# Patient Record
Sex: Male | Born: 1960 | Race: Black or African American | Hispanic: No | Marital: Single | Smoking: Former smoker
Health system: Southern US, Community
[De-identification: ages and names within clinical notes are randomized; demographics above are authoritative.]

## PROBLEM LIST (undated history)

## (undated) ENCOUNTER — Emergency Department (HOSPITAL_COMMUNITY): Admission: EM | Payer: Self-pay | Source: Home / Self Care

## (undated) DIAGNOSIS — D649 Anemia, unspecified: Secondary | ICD-10-CM

## (undated) DIAGNOSIS — R451 Restlessness and agitation: Secondary | ICD-10-CM

## (undated) HISTORY — DX: Anemia, unspecified: D64.9

## (undated) HISTORY — DX: Restlessness and agitation: R45.1

---

## 2000-07-02 ENCOUNTER — Emergency Department (HOSPITAL_COMMUNITY): Admission: EM | Admit: 2000-07-02 | Discharge: 2000-07-02 | Payer: Self-pay | Admitting: Emergency Medicine

## 2003-06-18 ENCOUNTER — Emergency Department (HOSPITAL_COMMUNITY): Admission: EM | Admit: 2003-06-18 | Discharge: 2003-06-18 | Payer: Self-pay | Admitting: Emergency Medicine

## 2003-06-18 ENCOUNTER — Encounter: Payer: Self-pay | Admitting: Emergency Medicine

## 2003-12-07 ENCOUNTER — Emergency Department (HOSPITAL_COMMUNITY): Admission: EM | Admit: 2003-12-07 | Discharge: 2003-12-07 | Payer: Self-pay | Admitting: Emergency Medicine

## 2004-02-11 ENCOUNTER — Emergency Department (HOSPITAL_COMMUNITY): Admission: EM | Admit: 2004-02-11 | Discharge: 2004-02-11 | Payer: Self-pay | Admitting: Emergency Medicine

## 2005-03-20 ENCOUNTER — Emergency Department (HOSPITAL_COMMUNITY): Admission: EM | Admit: 2005-03-20 | Discharge: 2005-03-20 | Payer: Self-pay | Admitting: Emergency Medicine

## 2006-03-08 ENCOUNTER — Emergency Department (HOSPITAL_COMMUNITY): Admission: EM | Admit: 2006-03-08 | Discharge: 2006-03-08 | Payer: Self-pay | Admitting: Emergency Medicine

## 2006-06-22 ENCOUNTER — Emergency Department (HOSPITAL_COMMUNITY): Admission: EM | Admit: 2006-06-22 | Discharge: 2006-06-22 | Payer: Self-pay | Admitting: Emergency Medicine

## 2006-06-28 ENCOUNTER — Emergency Department (HOSPITAL_COMMUNITY): Admission: EM | Admit: 2006-06-28 | Discharge: 2006-06-28 | Payer: Self-pay | Admitting: Emergency Medicine

## 2006-10-13 ENCOUNTER — Emergency Department (HOSPITAL_COMMUNITY): Admission: EM | Admit: 2006-10-13 | Discharge: 2006-10-13 | Payer: Self-pay | Admitting: Emergency Medicine

## 2008-02-14 ENCOUNTER — Emergency Department: Payer: Self-pay | Admitting: Emergency Medicine

## 2008-10-25 ENCOUNTER — Emergency Department (HOSPITAL_COMMUNITY): Admission: EM | Admit: 2008-10-25 | Discharge: 2008-10-25 | Payer: Self-pay | Admitting: Emergency Medicine

## 2011-01-16 LAB — URINALYSIS, ROUTINE W REFLEX MICROSCOPIC
Hgb urine dipstick: NEGATIVE
Nitrite: NEGATIVE
Urobilinogen, UA: 0.2 mg/dL (ref 0.0–1.0)

## 2011-01-16 LAB — GC/CHLAMYDIA PROBE AMP, GENITAL: Chlamydia, DNA Probe: NEGATIVE

## 2011-01-16 LAB — URINE MICROSCOPIC-ADD ON

## 2011-03-20 DIAGNOSIS — K4090 Unilateral inguinal hernia, without obstruction or gangrene, not specified as recurrent: Secondary | ICD-10-CM | POA: Insufficient documentation

## 2011-04-02 ENCOUNTER — Emergency Department (HOSPITAL_COMMUNITY): Payer: PRIVATE HEALTH INSURANCE

## 2011-04-02 ENCOUNTER — Inpatient Hospital Stay (HOSPITAL_COMMUNITY)
Admission: EM | Admit: 2011-04-02 | Discharge: 2011-04-04 | DRG: 208 | Disposition: A | Discharge status: Home / Self Care | Payer: PRIVATE HEALTH INSURANCE | Attending: Internal Medicine | Admitting: Internal Medicine

## 2011-04-02 DIAGNOSIS — D649 Anemia, unspecified: Secondary | ICD-10-CM | POA: Diagnosis present

## 2011-04-02 DIAGNOSIS — IMO0002 Reserved for concepts with insufficient information to code with codable children: Secondary | ICD-10-CM | POA: Diagnosis present

## 2011-04-02 DIAGNOSIS — J45902 Unspecified asthma with status asthmaticus: Secondary | ICD-10-CM | POA: Diagnosis present

## 2011-04-02 DIAGNOSIS — J9600 Acute respiratory failure, unspecified whether with hypoxia or hypercapnia: Principal | ICD-10-CM | POA: Diagnosis present

## 2011-04-02 LAB — CBC
MCHC: 33.9 g/dL (ref 30.0–36.0)
Platelets: 336 10*3/uL (ref 150–400)
RBC: 6.02 MIL/uL — ABNORMAL HIGH (ref 4.22–5.81)

## 2011-04-02 LAB — CK TOTAL AND CKMB (NOT AT ARMC)
CK, MB: 4 ng/mL (ref 0.3–4.0)
Relative Index: 1 (ref 0.0–2.5)
Total CK: 385 U/L — ABNORMAL HIGH (ref 7–232)

## 2011-04-02 LAB — DIFFERENTIAL
Basophils Absolute: 0.1 10*3/uL (ref 0.0–0.1)
Eosinophils Relative: 7 % — ABNORMAL HIGH (ref 0–5)
Lymphs Abs: 3.2 10*3/uL (ref 0.7–4.0)
Neutro Abs: 6.4 10*3/uL (ref 1.7–7.7)
Neutrophils Relative %: 56 % (ref 43–77)

## 2011-04-02 LAB — POCT I-STAT, CHEM 8
Calcium, Ion: 1.18 mmol/L (ref 1.12–1.32)
Creatinine, Ser: 1.2 mg/dL (ref 0.50–1.35)
Hemoglobin: 16.3 g/dL (ref 13.0–17.0)
Potassium: 4.3 mEq/L (ref 3.5–5.1)
TCO2: 27 mmol/L (ref 0–100)

## 2011-04-02 LAB — POCT I-STAT 3, ART BLOOD GAS (G3+)
TCO2: 27 mmol/L (ref 0–100)
pCO2 arterial: 52.6 mmHg — ABNORMAL HIGH (ref 35.0–45.0)
pH, Arterial: 7.232 — ABNORMAL LOW (ref 7.350–7.450)
pO2, Arterial: 89 mmHg (ref 80.0–100.0)
pO2, Arterial: 72 mmHg — ABNORMAL LOW (ref 80.0–100.0)

## 2011-04-02 LAB — URINE MICROSCOPIC-ADD ON

## 2011-04-02 LAB — URINALYSIS, ROUTINE W REFLEX MICROSCOPIC
Bilirubin Urine: NEGATIVE
Specific Gravity, Urine: 1.011 (ref 1.005–1.030)

## 2011-04-02 LAB — GLUCOSE, CAPILLARY: Glucose-Capillary: 150 mg/dL — ABNORMAL HIGH (ref 70–99)

## 2011-04-03 ENCOUNTER — Inpatient Hospital Stay (HOSPITAL_COMMUNITY): Payer: PRIVATE HEALTH INSURANCE

## 2011-04-03 DIAGNOSIS — J45902 Unspecified asthma with status asthmaticus: Secondary | ICD-10-CM

## 2011-04-03 DIAGNOSIS — J9600 Acute respiratory failure, unspecified whether with hypoxia or hypercapnia: Secondary | ICD-10-CM

## 2011-04-03 LAB — CBC
HCT: 33.3 % — ABNORMAL LOW (ref 39.0–52.0)
Hemoglobin: 11.2 g/dL — ABNORMAL LOW (ref 13.0–17.0)
MCH: 22.4 pg — ABNORMAL LOW (ref 26.0–34.0)
MCV: 66.5 fL — ABNORMAL LOW (ref 78.0–100.0)
Platelets: 302 10*3/uL (ref 150–400)
RBC: 5.01 MIL/uL (ref 4.22–5.81)

## 2011-04-03 LAB — BASIC METABOLIC PANEL
BUN: 10 mg/dL (ref 6–23)
Chloride: 104 mEq/L (ref 96–112)
Creatinine, Ser: 0.99 mg/dL (ref 0.50–1.35)
GFR calc Af Amer: >60 mL/min (ref >60)
GFR calc non Af Amer: >60 mL/min (ref >60)

## 2011-04-03 LAB — RAPID URINE DRUG SCREEN, HOSP PERFORMED: Amphetamines: NOT DETECTED

## 2011-04-04 ENCOUNTER — Inpatient Hospital Stay (HOSPITAL_COMMUNITY): Payer: PRIVATE HEALTH INSURANCE

## 2011-04-04 LAB — CBC
MCV: 65.9 fL — ABNORMAL LOW (ref 78.0–100.0)
Platelets: 298 10*3/uL (ref 150–400)
RBC: 4.99 MIL/uL (ref 4.22–5.81)
WBC: 12.7 10*3/uL — ABNORMAL HIGH (ref 4.0–10.5)

## 2011-04-04 LAB — BASIC METABOLIC PANEL
CO2: 27 mEq/L (ref 19–32)
Chloride: 106 mEq/L (ref 96–112)
Creatinine, Ser: 0.9 mg/dL (ref 0.50–1.35)
Potassium: 3.8 mEq/L (ref 3.5–5.1)

## 2011-04-05 NOTE — H&P (Signed)
  NAMEZAYVEON, RASCHKE NO.:  [id number]  MEDICAL RECORD NO.:  [id number]  LOCATION:  2103                         FACILITY:  [facility]  PHYSICIAN:  Charlcie Cradle. Delford Field, MD, FCCPDATE OF BIRTH:  03-22-1961  DATE OF ADMISSION:  04/02/2011 DATE OF DISCHARGE:                             HISTORY & PHYSICAL   CHIEF COMPLAINT:  Respiratory failure.  HISTORY OF PRESENT ILLNESS:  A 50 year old white male who has had increased shortness of breath for 1 week.  It progressed to the point where he came to the emergency room early morning hours April 02, 2011, in status asthmaticus was orally intubated.  Critical Care asked to admit. No prior history of other medical problems are noted.  PAST MEDICAL HISTORY:  Medical only asthma.  SURGICAL HISTORY:  None.  Family history, review of systems and social history noncontributory.  MEDICATIONS PRIOR TO ADMISSION:  Albuterol only.  ALLERGIES:  None.  PHYSICAL EXAMINATION:  VITAL SIGNS:  Pulse 107, blood pressure 96/68, sat 97%. GENERAL:  The patient is orally intubated, sedated on propofol drip. CHEST:  Distant breath sounds, prolonged inspiratory phase. CARDIAC:  A regular rate and rhythm without S3.  Normal S1, S2. ABDOMEN:  Soft, nontender. EXTREMITY:  No edema, clubbing or venous disease. SKIN:  Clear. NEUROLOGIC:  Intact.  LABORATORY DATA:  White count 11,000, hemoglobin 13.6, platelet count 336.  Urinalysis showed 3-6 white cells, many bacteria, trace leukocytes, troponin I is less than 0.3.  PT/PTT normal, pH 7.23, pCO2 61, pO2 89.  Sodium 140, potassium 4.3, chloride is 105, BUN 13, creatinine 1.2, CO2 27.  IMPRESSION:  Status asthmaticus with ongoing airway obstruction  RECOMMENDATIONS:  Admit to ICU bed, see for orders.  Give intensive neb treatments, give IV Levaquin for treatment of both urinary tract infection and asthmatic bronchitis, give IV Medrol.     Charlcie Cradle Delford Field, MD, [facility]     PEW/MEDQ  D:   04/02/2011  T:  04/02/2011  Job:  [id number]  Electronically Signed by Shan Levans MD FCCP on 04/05/2011 04:33:28 AM

## 2011-04-07 ENCOUNTER — Encounter: Payer: Self-pay | Admitting: Internal Medicine

## 2011-04-10 ENCOUNTER — Encounter: Payer: Self-pay | Admitting: Internal Medicine

## 2011-04-10 ENCOUNTER — Encounter: Payer: Self-pay | Admitting: *Deleted

## 2011-04-10 ENCOUNTER — Ambulatory Visit (INDEPENDENT_AMBULATORY_CARE_PROVIDER_SITE_OTHER): Payer: PRIVATE HEALTH INSURANCE | Admitting: Internal Medicine

## 2011-04-10 VITALS — BP 136/80 | HR 85 | Temp 97.8°F | Ht 72.0 in | Wt 172.8 lb

## 2011-04-10 DIAGNOSIS — R0609 Other forms of dyspnea: Secondary | ICD-10-CM

## 2011-04-10 DIAGNOSIS — J449 Chronic obstructive pulmonary disease, unspecified: Secondary | ICD-10-CM | POA: Insufficient documentation

## 2011-04-10 DIAGNOSIS — J45909 Unspecified asthma, uncomplicated: Secondary | ICD-10-CM

## 2011-04-10 DIAGNOSIS — R0600 Dyspnea, unspecified: Secondary | ICD-10-CM

## 2011-04-10 DIAGNOSIS — J441 Chronic obstructive pulmonary disease with (acute) exacerbation: Secondary | ICD-10-CM | POA: Diagnosis present

## 2011-04-10 MED ORDER — BUDESONIDE-FORMOTEROL FUMARATE 160-4.5 MCG/ACT IN AERO: INHALATION_SPRAY | RESPIRATORY_TRACT | Status: DC

## 2011-04-10 NOTE — Assessment & Plan Note (Addendum)
DDX of  difficult airways managment all start with A and  include Adherence, Ace Inhibitors, Acid Reflux, Active Sinus Disease, Alpha 1 Antitripsin deficiency, Anxiety masquerading as Airways dz,  ABPA,  allergy(esp in young), Aspiration (esp in elderly), Adverse effects of DPI,  Active smokers, plus two Bs  = Bronchiectasis and Beta blocker use..and one C= CHF   In this case Adherence is the biggest issue and starts with  inability to use HFA optimally  and also  understand that SABA treats the symptoms but doesn't get to the underlying problem (inflammation).  I used  the analogy of putting steroid cream on a rash to help explain the meaning of topical therapy and the need to get the drug to the target tissue.  The proper method of use, as well as anticipated side effects, of this metered-dose inhaler are discussed and demonstrated to the patient. improvedto 75% with coaching  Advair not adequate, still wheezing on prednisone so try change to symbicort 160 hfa  Needs f/u with PFT's before return to dusty environment,  Though strongly suspect the cigarettes were the bigger culprit and needs to maintain off at all costs

## 2011-04-10 NOTE — Progress Notes (Signed)
Subjective:     Patient ID: Colton Ford, male   DOB: 20-Feb-1961, 50 y.o.   MRN: [id number]  HPI  49 yobm quit smoking on 04/02/11 with severe resp distress >  > ICU on ventilator with dx asthma exac  04/10/2011 Initial pulmonary office eval usually followed by [name] with h/o childhood asthma outgrew it around the 3rd grade with one episode of ER trip 2008  And since then required intermittent albuterol sometimes not for several months at a time but since 03/20/2011 needed it daily.  Addmit 7/1-12/2010 for status asthmaticus requiring vent overnight and extubated and discharged on advair on 7/3.   Works at [employer] where dusty and hot but last worked there 6/29 and has not returned.  Still sob variably present sitting still and some worse walking across the parking lot. No purulent sputum.  Pt denies any significant sore throat, dysphagia, itching, sneezing,  nasal congestion or excess/ purulent secretions,  fever, chills, sweats, unintended wt loss, pleuritic or exertional cp, hempoptysis, orthopnea pnd or leg swelling.    Also denies any obvious fluctuation of symptoms with weather or environmental changes or other aggravating or alleviating factors.        Review of Systems  Constitutional: Negative for fever, chills, activity change, appetite change and unexpected weight change.  HENT: Negative for congestion, sore throat, rhinorrhea, sneezing, trouble swallowing, dental problem, voice change and postnasal drip.   Eyes: Negative for visual disturbance.  Respiratory: Positive for cough and shortness of breath. Negative for choking.   Cardiovascular: Negative for chest pain and leg swelling.  Gastrointestinal: Negative for nausea, vomiting and abdominal pain.  Genitourinary: Negative for difficulty urinating.  Musculoskeletal: Negative for arthralgias.  Skin: Negative for rash.  Psychiatric/Behavioral: Negative for behavioral problems and confusion.       Objective:   Physical Exam Pleasant amb relatively thin bm nad Wt 172 04/10/2011 HEENT mild turbinate edema.  Oropharynx no thrush or excess pnd or cobblestoning.  No JVD or cervical adenopathy. Mild accessory muscle hypertrophy. Trachea midline, nl thryroid. Chest was hyperinflated by percussion with diminished breath sounds and moderate increased exp time with trace bilateral  late exp wheeze. Hoover sign positive at mid inspiration. Regular rate and rhythm without murmur gallop or rub or increase P2 or edema.  Abd: no hsm, nl excursion. Ext warm without cyanosis or clubbing.      Assessment:          Plan:

## 2011-04-10 NOTE — Patient Instructions (Signed)
Start symbicort 160 Take 2 puffs first thing in am and then another 2 puffs about 12 hours later.    No work from the day you were admitted (04/02/11) through your follow up visit here in 2 weeks  Please schedule a follow up office visit in 2  weeks, sooner if needed with PFT's on return  Work on inhaler technique:  relax and gently blow all the way out then take a nice smooth deep breath back in, triggering the inhaler at same time you start breathing in.  Hold for up to 5 seconds if you can.  Rinse and gargle with water when done   If your mouth or throat starts to bother you,   I suggest you time the inhaler to your dental care and after using the inhaler(s) brush teeth and tongue with a baking soda containing toothpaste and when you rinse this out, gargle with it first to see if this helps your mouth and throat.

## 2011-04-11 NOTE — Discharge Summary (Signed)
[name], BUSS NO.:  [id number]  MEDICAL RECORD NO.:  [id number]  LOCATION:  2103                         FACILITY:  [facility]  PHYSICIAN:  Nelda Bucks, MD DATE OF BIRTH:  1961/09/05  DATE OF ADMISSION:  04/02/2011 DATE OF DISCHARGE:  04/04/2011                              DISCHARGE SUMMARY   DISCHARGE DIAGNOSES: 1. Consistent with acute vent-dependent respiratory failure secondary     to asthma exacerbation. 2. Agitation which is resolved. 3. Anemia which is mild.  HISTORY OF PRESENT ILLNESS:  Colton Ford is a 50 year old African American gentleman who is admitted with acute asthma exacerbation.  He required intubation in the emergency department for respiratory failure and was admitted to the pulmonary critical care service.  Note, he was initially in more distress, he has been using the albuterol puffer multiple times without effect.  LINES AND TUBES:  Intubated from April 02, 2011 to April 03, 2011.  Cultures were none.  Antibiotics, he was briefly on Levaquin and then stopped. He was then successfully liberated from mechanical ventilatory support.  LABORATORY DATA:  Hemoglobin 11.2, hematocrit 33.3, platelets are 302, and WBC is 6.8.  Sodium 137, potassium 4.7, chloride 104, CO2 is 26, BUN is 10, creatinine 0.99, glucose 133.  Troponin-I is 0.30, CK-MB is 4, CK is 385, and calcium is 8.7.  Urine drug screen was negative.  MRSA screen was negative.  RADIOGRAPHIC DATA:  Chest x-ray on April 03, 2011, demonstrate endotracheal tube and NG tube are unchanged, stable mediastinal cardiac silhouette.  Lungs clear.  HOSPITAL COURSE BY DISCHARGE DIAGNOSES: 1. Acute asthma exacerbation with questionable component of chronic     obstructive pulmonary disease.  He required orotracheal intubation     in the emergency department.  He was admitted to the ICU and     successfully liberated from mechanical ventilatory support within     24 hours.  He was  treated with IV steroids and nebulized     bronchodilators.  He has been discharged home on April 04, 2011, in     improved condition on a prednisone taper and be given Advair and     albuterol for p.r.n. rescue medications. 2. Agitation which is resolved. 3. Anemia which is mild.  We will continue to monitor.  DISCHARGE MEDICATIONS: 1. He will be on Advair 250/50 one puff b.i.d. 2. He will be on prednisone 10 mg on a taper 40 mg for 4 days, 30 mg     for 4 days, 20 mg for 4 days, and 10 mg for 4 days and then stop. 3. He also has albuterol puffer MDI.  He is to have 2 puffs q.4 h.     p.r.n. as needed.  He has been instructed should he utilize the     puffer more than that to alert, so we can change his medications.     He will be followed up by Dr. Sandrea Hughs on April 10, 2011, at     which time, at which time he will have full pulmonary function     test.  DIET:  As tolerated.  DISPOSITION/CONDITION:  Improved.  He has been  liberated from mechanical ventilatory support and be discharged and followed up on an outpatient basis by the pulmonary critical care specialist, Dr. Sandrea Hughs.     Devra Dopp, MSN, ACNP   ______________________________ Nelda Bucks, MD    SM/MEDQ  D:  04/04/2011  T:  04/05/2011  Job:  [id number]  Electronically Signed by Devra Dopp MSN ACNP on 04/10/2011 04:06:42 PM Electronically Signed by Rory Percy MD on 04/11/2011 12:55:20 AM

## 2011-04-12 ENCOUNTER — Encounter (INDEPENDENT_AMBULATORY_CARE_PROVIDER_SITE_OTHER): Payer: Self-pay | Admitting: Surgery

## 2011-04-13 ENCOUNTER — Ambulatory Visit (INDEPENDENT_AMBULATORY_CARE_PROVIDER_SITE_OTHER): Payer: PRIVATE HEALTH INSURANCE | Admitting: Surgery

## 2011-04-18 ENCOUNTER — Telehealth: Payer: Self-pay | Admitting: Internal Medicine

## 2011-04-18 NOTE — Telephone Encounter (Signed)
Cleared until return to ov for pft's  (I filled out the paperwork through 04/26/11)

## 2011-04-18 NOTE — Telephone Encounter (Signed)
Colton Ford, does MW still have disability papers on this patient? Pls advise.

## 2011-04-18 NOTE — Telephone Encounter (Signed)
Pt is aware and says he did speak to Elease Hashimoto in [place] the other day. The pt says he will call Elease Hashimoto in the morning when she is back in the office.

## 2011-04-18 NOTE — Telephone Encounter (Signed)
PATIENT NEEDS DISABILITY FORMS SO THAT HE CAN BE PAID.  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  PLEASE CALL AT [phone] AS SOON AS FORMS ARE READY.

## 2011-04-25 ENCOUNTER — Encounter: Payer: Self-pay | Admitting: *Deleted

## 2011-04-25 ENCOUNTER — Ambulatory Visit (INDEPENDENT_AMBULATORY_CARE_PROVIDER_SITE_OTHER): Payer: PRIVATE HEALTH INSURANCE | Admitting: Internal Medicine

## 2011-04-25 ENCOUNTER — Telehealth: Payer: Self-pay | Admitting: Internal Medicine

## 2011-04-25 ENCOUNTER — Encounter: Payer: Self-pay | Admitting: Internal Medicine

## 2011-04-25 VITALS — BP 120/70 | HR 82 | Temp 97.4°F | Ht 72.0 in | Wt 178.0 lb

## 2011-04-25 DIAGNOSIS — R0600 Dyspnea, unspecified: Secondary | ICD-10-CM

## 2011-04-25 DIAGNOSIS — J45909 Unspecified asthma, uncomplicated: Secondary | ICD-10-CM

## 2011-04-25 DIAGNOSIS — R0609 Other forms of dyspnea: Secondary | ICD-10-CM

## 2011-04-25 LAB — PULMONARY FUNCTION TEST

## 2011-04-25 MED ORDER — BUDESONIDE-FORMOTEROL FUMARATE 160-4.5 MCG/ACT IN AERO: INHALATION_SPRAY | RESPIRATORY_TRACT | Status: DC

## 2011-04-25 MED ORDER — ALBUTEROL SULFATE HFA 108 (90 BASE) MCG/ACT IN AERS: 2.0000 | INHALATION_SPRAY | Freq: Four times a day (QID) | RESPIRATORY_TRACT | Status: DC | PRN

## 2011-04-25 NOTE — Telephone Encounter (Signed)
Pt has already called requesting this letter.  Huntley Dec is aware of this and aware this will be address tomorrow am when MW returns to office.  Will sign off on this phone message as pt has already called requesting the exact same thing - pls see earlier phone message for additional info.

## 2011-04-25 NOTE — Progress Notes (Signed)
Subjective:     Patient ID: Colton Ford, male   DOB: 07-Jul-1961, 50 y.o.   MRN: [id number]  HPI  50  yobm quit smoking on 04/02/11 with severe resp distress > [place] > ICU on ventilator with dx asthma exac  04/10/2011 Initial pulmonary office eval usually followed by [name] with h/o childhood asthma outgrew it around the 3rd grade with one episode of ER trip 2008  And since then required intermittent albuterol sometimes not for several months at a time but since 03/20/2011 needed it daily.  Addmit 7/1-12/2010 for status asthmaticus requiring vent overnight and extubated and discharged on advair on 7/3.   Works at [employer] where dusty and hot but last worked there 6/29 and has not returned.  rec  Start symbicort 160 Take 2 puffs first thing in am and then another 2 puffs about 12 hours later.    No work from the day you were admitted (04/02/11) through your follow up visit here in 2 weeks  Please schedule a follow up office visit in 2  weeks, sooner if needed with PFT's on return  Work on inhaler technique    04/25/2011 ov/Wert cc breathing better, cough gone.  No need for saba day or night since last ov  Pt denies any significant sore throat, dysphagia, itching, sneezing,  nasal congestion or excess/ purulent secretions,  fever, chills, sweats, unintended wt loss, pleuritic or exertional cp, hempoptysis, orthopnea pnd or leg swelling.    Also denies any obvious fluctuation of symptoms with weather or environmental changes or other aggravating or alleviating factors.      .   .       Objective:   Physical Exam Pleasant amb relatively thin bm nad  Wt 172 04/10/2011 > 178 04/25/2011   HEENT: nl dentition, turbinates, and orophanx. Nl external ear canals without cough reflex   NECK :  without JVD/Nodes/TM/ nl carotid upstrokes bilaterally   LUNGS: no acc muscle use, clear to A and P bilaterally without cough on insp or exp maneuvers   CV:  RRR  no s3 or murmur or  increase in P2, no edema   ABD:  soft and nontender with nl excursion in the supine position. No bruits or organomegaly, bowel sounds nl  MS:  warm without deformities, calf tenderness, cyanosis or clubbing  SKIN: warm and dry without lesions          Assessment:          Plan:

## 2011-04-25 NOTE — Progress Notes (Signed)
PFT done today. 

## 2011-04-25 NOTE — Assessment & Plan Note (Signed)
I had an extended discussion with the patient today lasting 15 to 20 minutes of a 25 minute visit on the following issues:  The proper method of use, as well as anticipated side effects, of this metered-dose inhaler are discussed and demonstrated to the patient.  Improved to 90% with coaching  All goals of chronic asthma control met including optimal function and elimination of symptoms with minimal need for rescue therapy.  Contingencies discussed in full including contacting this office immediately if not controlling the symptoms using the rule of two's.     Key is to maintain off cigs at all costs.  High risk recurrent status asthmaticus if adherence an issue, reviewed with pt and wife

## 2011-04-25 NOTE — Patient Instructions (Signed)
Ok to resume work effective 04/25/2011   If your breathing worsens or you need to use your rescue inhaler (proaire)  more than twice weekly or wake up more than twice a month with any respiratory symptoms or require more than two rescue inhalers per year, we need to see you right away because this means we're not controlling the underlying problem (inflammation) adequately.  Rescue inhalers do not control inflammation and overuse can lead to unnecessary and costly consequences.  They can make you feel better temporarily but eventually they will quit working effectively much as sleep aids lead to more insomnia if used regularly.   Please schedule a follow up visit in 3 months but call sooner if needed

## 2011-04-25 NOTE — Telephone Encounter (Signed)
Spoke with pt.  He is requesting letter for work stating it is ok to return to work with no restrictions.  Fax to [phone] attn: Bonnita Nasuti.  Dr. Sherene Sires, are you ok with letter stating this?

## 2011-04-25 NOTE — Telephone Encounter (Signed)
Note: Huntley Dec called requesting the same - another phone message was taken which I signed off on bc it's requesting same thing.  Pt is aware MW is out of office until tomorrow am and is ok with this.

## 2011-04-25 NOTE — Telephone Encounter (Signed)
Letter faxed to the number given. Pt aware.

## 2011-08-11 ENCOUNTER — Telehealth: Payer: Self-pay | Admitting: Internal Medicine

## 2011-08-11 NOTE — Telephone Encounter (Signed)
Patient notified that we do not have any Symbicort samples in the office. Pt does have a prescription at the pharmacy but states he cannot afford to pick this up right now. He will call his PCP to see if they can provide him with a sample in the meantime.

## 2011-11-17 ENCOUNTER — Observation Stay (HOSPITAL_COMMUNITY)
Admission: EM | Admit: 2011-11-17 | Discharge: 2011-11-17 | Disposition: A | Discharge status: Home / Self Care | Payer: No Typology Code available for payment source | Attending: Emergency Medicine | Admitting: Emergency Medicine

## 2011-11-17 ENCOUNTER — Encounter (HOSPITAL_COMMUNITY): Payer: Self-pay | Admitting: *Deleted

## 2011-11-17 ENCOUNTER — Emergency Department (HOSPITAL_COMMUNITY): Payer: No Typology Code available for payment source

## 2011-11-17 DIAGNOSIS — Z91199 Patient's noncompliance with other medical treatment and regimen due to unspecified reason: Secondary | ICD-10-CM | POA: Insufficient documentation

## 2011-11-17 DIAGNOSIS — Z9119 Patient's noncompliance with other medical treatment and regimen: Secondary | ICD-10-CM | POA: Insufficient documentation

## 2011-11-17 DIAGNOSIS — J45901 Unspecified asthma with (acute) exacerbation: Principal | ICD-10-CM | POA: Insufficient documentation

## 2011-11-17 LAB — CBC
Hemoglobin: 14.9 g/dL (ref 13.0–17.0)
MCHC: 34.9 g/dL (ref 30.0–36.0)
Platelets: 313 10*3/uL (ref 150–400)
RDW: 14.8 % (ref 11.5–15.5)

## 2011-11-17 LAB — POCT I-STAT, CHEM 8
Chloride: 104 mEq/L (ref 96–112)
HCT: 50 % (ref 39.0–52.0)
Hemoglobin: 17 g/dL (ref 13.0–17.0)
Potassium: 4.1 mEq/L (ref 3.5–5.1)
Sodium: 138 mEq/L (ref 135–145)

## 2011-11-17 LAB — DIFFERENTIAL
Basophils Absolute: 0 10*3/uL (ref 0.0–0.1)
Basophils Relative: 0 % (ref 0–1)
Neutro Abs: 9.7 10*3/uL — ABNORMAL HIGH (ref 1.7–7.7)
Neutrophils Relative %: 84 % — ABNORMAL HIGH (ref 43–77)

## 2011-11-17 MED ORDER — ALBUTEROL SULFATE (5 MG/ML) 0.5% IN NEBU
2.5000 mg | INHALATION_SOLUTION | Freq: Once | RESPIRATORY_TRACT | Status: AC
  Administered 2011-11-17: 5 mg via RESPIRATORY_TRACT

## 2011-11-17 MED ORDER — ALBUTEROL SULFATE HFA 108 (90 BASE) MCG/ACT IN AERS
2.0000 | INHALATION_SPRAY | RESPIRATORY_TRACT | Status: DC | PRN
  Filled 2011-11-17: qty 6.7

## 2011-11-17 MED ORDER — ALBUTEROL (5 MG/ML) CONTINUOUS INHALATION SOLN
INHALATION_SOLUTION | RESPIRATORY_TRACT | Status: AC
  Administered 2011-11-17: 15 mg via RESPIRATORY_TRACT
  Filled 2011-11-17: qty 20

## 2011-11-17 MED ORDER — BUDESONIDE-FORMOTEROL FUMARATE 160-4.5 MCG/ACT IN AERO
2.0000 | INHALATION_SPRAY | Freq: Two times a day (BID) | RESPIRATORY_TRACT | Status: DC
  Filled 2011-11-17 (×2): qty 6

## 2011-11-17 MED ORDER — IPRATROPIUM BROMIDE 0.02 % IN SOLN
0.5000 mg | Freq: Once | RESPIRATORY_TRACT | Status: AC
  Administered 2011-11-17: 0.5 mg via RESPIRATORY_TRACT
  Filled 2011-11-17: qty 2.5

## 2011-11-17 MED ORDER — ALBUTEROL SULFATE (5 MG/ML) 0.5% IN NEBU
INHALATION_SOLUTION | RESPIRATORY_TRACT | Status: AC
  Filled 2011-11-17: qty 1

## 2011-11-17 MED ORDER — METHYLPREDNISOLONE SODIUM SUCC 125 MG IJ SOLR
125.0000 mg | Freq: Once | INTRAMUSCULAR | Status: AC
  Administered 2011-11-17: 125 mg via INTRAVENOUS
  Filled 2011-11-17: qty 2

## 2011-11-17 MED ORDER — IPRATROPIUM BROMIDE 0.02 % IN SOLN
0.5000 mg | Freq: Once | RESPIRATORY_TRACT | Status: AC
  Administered 2011-11-17: 0.5 mg via RESPIRATORY_TRACT

## 2011-11-17 MED ORDER — ALBUTEROL SULFATE (5 MG/ML) 0.5% IN NEBU
2.5000 mg | INHALATION_SOLUTION | Freq: Once | RESPIRATORY_TRACT | Status: AC
  Administered 2011-11-17: 15 mg via RESPIRATORY_TRACT

## 2011-11-17 MED ORDER — IPRATROPIUM BROMIDE 0.02 % IN SOLN
RESPIRATORY_TRACT | Status: AC
  Filled 2011-11-17: qty 2.5

## 2011-11-17 MED ORDER — PREDNISONE 10 MG PO TABS: 50.0000 mg | ORAL_TABLET | Freq: Every day | ORAL | Status: DC

## 2011-11-17 NOTE — ED Provider Notes (Signed)
Medical screening examination/treatment/procedure(s) were conducted as a shared visit with non-physician practitioner(s) and myself.  I personally evaluated the patient during the encounter  12:52 AM  Patient with significant respiratory distress on initial evaluation with significantly decreased breath sounds on exam in his respiratory wheezing present.  Patient has been intubated before and some minimal history is taken before beginning the patient's steroid administration and continue nebulizer treatments.  Patient will need continued monitoring for improvement and the possible need for BiPAP administration if necessary to try and prevent patient's need for intubation at this time.  Patient is maintaining his mental status currently.  2:32 AM  Patient has been reassessed and he is subjectively improved per his own assessment and also on minor.  He does have decreased air movement bilaterally but is improved from prior exam.  Patient is breathing much more comfortably and able to talk in full sentences.  His oxygen levels stay in the mid 90s on room air.  I feel this patient is now appropriate for an asthma protocol she does need continued treatments per sickly given his past medical history but is improving at this time.  Patient was reassessed at approximately 4:30 AM while he was completing a second breathing treatment the patient started feeling like he was having some worsening shortness of breath.  He is now much more comfortable while completing his breathing treatment and still able to speak in full sentences.  He is in the CDU protocol and will continue to be monitored through the morning for either further improvements or need for admission.  He does have improved air movement on my assessment at this time.  Nat Christen, MD 11/17/11 [phone]

## 2011-11-17 NOTE — ED Notes (Signed)
The pt has asthma and he  Has had diff breathing for 3  Days.  He ran out of his symbicort. And that is what brought him him.  Mod resp distress

## 2011-11-17 NOTE — Discharge Instructions (Signed)
Asthma Attack Prevention HOW CAN ASTHMA BE PREVENTED? Currently, there is no way to prevent asthma from starting. However, you can take steps to control the disease and prevent its symptoms after you have been diagnosed. Learn about your asthma and how to control it. Take an active role to control your asthma by working with your caregiver to create and follow an asthma action plan. An asthma action plan guides you in taking your medicines properly, avoiding factors that make your asthma worse, tracking your level of asthma control, responding to worsening asthma, and seeking emergency care when needed. To track your asthma, keep records of your symptoms, check your peak flow number using a peak flow meter (handheld device that shows how well air moves out of your lungs), and get regular asthma checkups.  Other ways to prevent asthma attacks include:  Use medicines as your caregiver directs.   Identify and avoid things that make your asthma worse (as much as you can).   Keep track of your asthma symptoms and level of control.   Get regular checkups for your asthma.   With your caregiver, write a detailed plan for taking medicines and managing an asthma attack. Then be sure to follow your action plan. Asthma is an ongoing condition that needs regular monitoring and treatment.   Identify and avoid asthma triggers. A number of outdoor allergens and irritants (pollen, mold, cold air, air pollution) can trigger asthma attacks. Find out what causes or makes your asthma worse, and take steps to avoid those triggers (see below).   Monitor your breathing. Learn to recognize warning signs of an attack, such as slight coughing, wheezing or shortness of breath. However, your lung function may already decrease before you notice any signs or symptoms, so regularly measure and record your peak airflow with a home peak flow meter.   Identify and treat attacks early. If you act quickly, you're less likely to have  a severe attack. You will also need less medicine to control your symptoms. When your peak flow measurements decrease and alert you to an upcoming attack, take your medicine as instructed, and immediately stop any activity that may have triggered the attack. If your symptoms do not improve, get medical help.   Pay attention to increasing quick-relief inhaler use. If you find yourself relying on your quick-relief inhaler (such as albuterol), your asthma is not under control. See your caregiver about adjusting your treatment.  IDENTIFY AND CONTROL FACTORS THAT MAKE YOUR ASTHMA WORSE A number of common things can set off or make your asthma symptoms worse (asthma triggers). Keep track of your asthma symptoms for several weeks, detailing all the environmental and emotional factors that are linked with your asthma. When you have an asthma attack, go back to your asthma diary to see which factor, or combination of factors, might have contributed to it. Once you know what these factors are, you can take steps to control many of them.  Allergies: If you have allergies and asthma, it is important to take asthma prevention steps at home. Asthma attacks (worsening of asthma symptoms) can be triggered by allergies, which can cause temporary increased inflammation of your airways. Minimizing contact with the substance to which you are allergic will help prevent an asthma attack. Animal Dander:   Some people are allergic to the flakes of skin or dried saliva from animals with fur or feathers. Keep these pets out of your home.   If you can't keep a pet outdoors, keep the   pet out of your bedroom and other sleeping areas at all times, and keep the door closed.   Remove carpets and furniture covered with cloth from your home. If that is not possible, keep the pet away from fabric-covered furniture and carpets.  Dust Mites:  Many people with asthma are allergic to dust mites. Dust mites are tiny bugs that are found in  every home, in mattresses, pillows, carpets, fabric-covered furniture, bedcovers, clothes, stuffed toys, fabric, and other fabric-covered items.   Cover your mattress in a special dust-proof cover.   Cover your pillow in a special dust-proof cover, or wash the pillow each week in hot water. Water must be hotter than 130 F to kill dust mites. Cold or warm water used with detergent and bleach can also be effective.   Wash the sheets and blankets on your bed each week in hot water.   Try not to sleep or lie on cloth-covered cushions.   Call ahead when traveling and ask for a smoke-free hotel room. Bring your own bedding and pillows, in case the hotel only supplies feather pillows and down comforters, which may contain dust mites and cause asthma symptoms.   Remove carpets from your bedroom and those laid on concrete, if you can.   Keep stuffed toys out of the bed, or wash the toys weekly in hot water or cooler water with detergent and bleach.  Cockroaches:  Many people with asthma are allergic to the droppings and remains of cockroaches.   Keep food and garbage in closed containers. Never leave food out.   Use poison baits, traps, powders, gels, or paste (for example, boric acid).   If a spray is used to kill cockroaches, stay out of the room until the odor goes away.  Indoor Mold:  Fix leaky faucets, pipes, or other sources of water that have mold around them.   Clean moldy surfaces with a cleaner that has bleach in it.  Pollen and Outdoor Mold:  When pollen or mold spore counts are high, try to keep your windows closed.   Stay indoors with windows closed from late morning to afternoon, if you can. Pollen and some mold spore counts are highest at that time.   Ask your caregiver whether you need to take or increase anti-inflammatory medicine before your allergy season starts.  Irritants:   Tobacco smoke is an irritant. If you smoke, ask your caregiver how you can quit. Ask family  members to quit smoking, too. Do not allow smoking in your home or car.   If possible, do not use a wood-burning stove, kerosene heater, or fireplace. Minimize exposure to all sources of smoke, including incense, candles, fires, and fireworks.   Try to stay away from strong odors and sprays, such as perfume, talcum powder, hair spray, and paints.   Decrease humidity in your home and use an indoor air cleaning device. Reduce indoor humidity to below 60 percent. Dehumidifiers or central air conditioners can do this.   Try to have someone else vacuum for you once or twice a week, if you can. Stay out of rooms while they are being vacuumed and for a short while afterward.   If you vacuum, use a dust mask from a hardware store, a double-layered or microfilter vacuum cleaner bag, or a vacuum cleaner with a HEPA filter.   Sulfites in foods and beverages can be irritants. Do not drink beer or wine, or eat dried fruit, processed potatoes, or shrimp if they cause asthma   symptoms.   Cold air can trigger an asthma attack. Cover your nose and mouth with a scarf on cold or windy days.   Several health conditions can make asthma more difficult to manage, including runny nose, sinus infections, reflux disease, psychological stress, and sleep apnea. Your caregiver will treat these conditions, as well.   Avoid close contact with people who have a cold or the flu, since your asthma symptoms may get worse if you catch the infection from them. Wash your hands thoroughly after touching items that may have been handled by people with a respiratory infection.   Get a flu shot every year to protect against the flu virus, which often makes asthma worse for days or weeks. Also get a pneumonia shot once every five to 10 years.  Drugs:  Aspirin and other painkillers can cause asthma attacks. 10% to 20% of people with asthma have sensitivity to aspirin or a group of painkillers called non-steroidal anti-inflammatory drugs  (NSAIDS), such as ibuprofen and naproxen. These drugs are used to treat pain and reduce fevers. Asthma attacks caused by any of these medicines can be severe and even fatal. These drugs must be avoided in people who have known aspirin sensitive asthma. Products with acetaminophen are considered safe for people who have asthma. It is important that people with aspirin sensitivity read labels of all over-the-counter drugs used to treat pain, colds, coughs, and fever.   Beta blockers and ACE inhibitors are other drugs which you should discuss with your caregiver, in relation to your asthma.  ALLERGY SKIN TESTING  Ask your asthma caregiver about allergy skin testing or blood testing (RAST test) to identify the allergens to which you are sensitive. If you are found to have allergies, allergy shots (immunotherapy) for asthma may help prevent future allergies and asthma. With allergy shots, small doses of allergens (substances to which you are allergic) are injected under your skin on a regular schedule. Over a period of time, your body may become used to the allergen and less responsive with asthma symptoms. You can also take measures to minimize your exposure to those allergens. EXERCISE  If you have exercise-induced asthma, or are planning vigorous exercise, or exercise in cold, humid, or dry environments, prevent exercise-induced asthma by following your caregiver's advice regarding asthma treatment before exercising. Document Released: 09/06/2009 Document Revised: 05/31/2011 Document Reviewed: 09/06/2009 [employer] Patient Information 2012 [employer].Asthma, Adult Asthma is caused by narrowing of the air passages in the lungs. It may be triggered by pollen, dust, animal dander, molds, some foods, respiratory infections, exposure to smoke, exercise, emotional stress or other allergens (things that cause allergic reactions or allergies). Repeat attacks are common. HOME CARE INSTRUCTIONS   Use prescription  medications as ordered by your caregiver.   Avoid pollen, dust, animal dander, molds, smoke and other things that cause attacks at home and at work.   You may have fewer attacks if you decrease dust in your home. Electrostatic air cleaners may help.   It may help to replace your pillows or mattress with materials less likely to cause allergies.   Talk to your caregiver about an action plan for managing asthma attacks at home, including, the use of a peak flow meter which measures the severity of your asthma attack. An action plan can help minimize or stop the attack without having to seek medical care.   If you are not on a fluid restriction, drink 8 to 10 glasses of water each day.   Always   have a plan prepared for seeking medical attention, including, calling your physician, accessing local emergency care, and calling 911 (in the [place] for a severe attack.   Discuss possible exercise routines with your caregiver.   If animal dander is the cause of asthma, you may need to get rid of pets.  SEEK MEDICAL CARE IF:   You have wheezing and shortness of breath even if taking medicine to prevent attacks.   You have muscle aches, chest pain or thickening of sputum.   Your sputum changes from clear or white to yellow, green, gray, or bloody.   You have any problems that may be related to the medicine you are taking (such as a rash, itching, swelling or trouble breathing).  SEEK IMMEDIATE MEDICAL CARE IF:   Your usual medicines do not stop your wheezing or there is increased coughing and/or shortness of breath.   You have increased difficulty breathing.   You have a fever.  MAKE SURE YOU:   Understand these instructions.   Will watch your condition.   Will get help right away if you are not doing well or get worse.  Document Released: 09/18/2005 Document Revised: 05/31/2011 Document Reviewed: 05/06/2008 [employer] Patient Information 2012 [employer]. 

## 2011-11-17 NOTE — ED Provider Notes (Signed)
History     CSN: [id number]  Arrival date & time 11/17/11  0013   First MD Initiated Contact with Patient 11/17/11 0036      Chief Complaint  Patient presents with  . Shortness of Breath    (Consider location/radiation/quality/duration/timing/severity/associated sxs/prior treatment) HPI Comments: Patient reports he has been out of his Simbarcort  inhaler for over a week.  He has been wheezing and having trouble breathing for the last 3-5 days has been using albuterol inhaler every 6 hours with minimal relief.  He states he can't afford to fill the prescription because of the new insurance policy and he has not met his deductible yet Been intubated last being in July of last year   Patient is a 51 y.o. male presenting with shortness of breath. The history is provided by the patient.  Shortness of Breath  The current episode started 5 to 7 days ago. The problem occurs continuously. The problem has been gradually worsening. The problem is severe. The symptoms are relieved by nothing. Associated symptoms include shortness of breath and wheezing. Pertinent negatives include no fever, no rhinorrhea, no stridor and no cough.    Past Medical History  Diagnosis Date  . Asthma   . Mild anemia   . Agitation     History reviewed. No pertinent past surgical history.  Family History  Problem Relation Age of Onset  . Asthma Sister   . Heart disease Sister   . Heart disease Brother     History  Substance Use Topics  . Smoking status: Former Smoker -- 0.5 packs/day for 30 years    Types: Cigarettes    Quit date: 03/05/2011  . Smokeless tobacco: Never Used  . Alcohol Use: No      Review of Systems  Constitutional: Negative for fever.  HENT: Negative for rhinorrhea and trouble swallowing.   Respiratory: Positive for shortness of breath and wheezing. Negative for cough and stridor.   Neurological: Negative for dizziness.    Allergies  Review of patient's allergies indicates no  known allergies.  Home Medications   Current Outpatient Rx  Name Route Sig Dispense Refill  . BUDESONIDE-FORMOTEROL FUMARATE 160-4.5 MCG/ACT IN AERO  Take 2 puffs first thing in am and then another 2 puffs about 12 hours later.    2 Inhaler 12  . PREDNISONE 10 MG PO TABS Oral Take 5 tablets (50 mg total) by mouth daily. 15 tablet 0    BP 126/69  Pulse 98  Temp(Src) 97.4 F (36.3 C) (Oral)  Resp 20  SpO2 95%  Physical Exam  Constitutional: He is oriented to person, place, and time. He appears well-developed and well-nourished.  HENT:  Head: Normocephalic.  Eyes: Pupils are equal, round, and reactive to light.  Neck: Normal range of motion.  Cardiovascular: Tachycardia present.   Pulmonary/Chest: Accessory muscle usage present. Tachypnea noted. He is in respiratory distress. He has wheezes.  Abdominal: Soft.  Musculoskeletal: Normal range of motion.  Neurological: He is alert and oriented to person, place, and time.  Skin: Skin is warm and dry.    ED Course  Procedures (including critical care time)  Labs Reviewed  CBC - Abnormal; Notable for the following:    WBC 11.6 (*)    RBC 6.42 (*)    MCV 66.5 (*)    MCH 23.2 (*)    All other components within normal limits  DIFFERENTIAL - Abnormal; Notable for the following:    Neutrophils Relative 84 (*)  Neutro Abs 9.7 (*)    Lymphocytes Relative 11 (*)    All other components within normal limits  POCT I-STAT, CHEM 8 - Abnormal; Notable for the following:    Creatinine, Ser 1.50 (*)    Glucose, Bld 103 (*)    All other components within normal limits  LAB REPORT - SCANNED   Dg Chest Port 1 View  11/17/2011  *RADIOLOGY REPORT*  Clinical Data: Shortness of breath.  Cough and wheezing.  PORTABLE CHEST - 1 VIEW  Comparison: None.  Findings: The heart size is normal.  The lungs are clear.  Mild emphysematous changes are evident.  No focal airspace disease is present.  The visualized soft tissues and bony thorax are  unremarkable.  IMPRESSION:  1.  No acute cardiopulmonary disease. 2.  Emphysema.  Original Report Authenticated By: Jamesetta Orleans. MATTERN, M.D.     1. Asthma exacerbation       MDM  Asthma exacerbation.  Medication noncompliance Give patient albuterol, Atrovent inhaler, and then reassess hopefully, we will avoid intubation at this time.  The patient tires will try the BiPAP.  He understands this and will inform us if his breathing becomes more labored        Arman Filter, NP 11/17/11 0046  Arman Filter, NP 11/18/11 1952

## 2011-11-17 NOTE — ED Notes (Signed)
Peak Flow of 250.

## 2011-11-17 NOTE — ED Notes (Signed)
Patient resting and remains on monitor and oxygen 2L with sats of 97% with NAD at this time.

## 2011-11-17 NOTE — ED Notes (Signed)
The pt went to get his inhaler today and did not have the money to get it filled

## 2011-11-17 NOTE — ED Notes (Signed)
Ordered lunch tray 

## 2011-11-17 NOTE — ED Notes (Signed)
Patient resting with family at bedside. Patient remains on monitor and 2L oxygen with sats of 94%. NAD at this time.

## 2011-11-17 NOTE — ED Notes (Signed)
Received inhaler from pharmacy.

## 2011-11-17 NOTE — ED Notes (Signed)
Patient sleeping with NAD at this time. Patient remains on monitor and sata of 94% on RA.

## 2011-11-17 NOTE — ED Notes (Signed)
Patient ambulated in hallway on RA. Patients sats stayed between 93% and 95% while ambulating.

## 2011-11-17 NOTE — ED Notes (Signed)
Patient states he feels better. Lungs clear to auscultation. No respiratory distress noted

## 2011-11-17 NOTE — ED Notes (Signed)
Patient resting and remains on monitor with 2L oxygen with sats at 93% with NAD at this time.

## 2011-11-17 NOTE — ED Notes (Signed)
Patient sleeping while remaining on monitor and 2L oxygen with sats of 95% with NAD at this time.

## 2011-11-17 NOTE — ED Notes (Signed)
Waiting patients symbicort inhaler from pharmacy. Pharmacy has been emailed and called with request.

## 2011-11-17 NOTE — ED Provider Notes (Signed)
PT on Asthma protocol. Pt has significant hx of asthma exacerbation which includes intubation. The patient responded well to treatments right away and therefore was seen fit for CDU. Since in the CDU his stay has remained uneventful. At 7:00AM his peak flow was 165, only 35% of his predictive value. Pt monitored for 6 more hours, peak flow repeated and is 250 which is much improved. I do not appreciate any wheezing on lung exam. Pt noncompliant with symbicort due to inability to pay for it. Will try to get inhaler to go in ED. Pt will be put on short course of oral steroids and is to follow-up with his PCP next week.  Dorthula Matas, PA 11/17/11 1304  Pt given Rx for prednisone and Symbicort and albuterol inhalers ordered in ED.  Dorthula Matas, PA 11/17/11 1308

## 2011-11-17 NOTE — ED Notes (Signed)
Patient resting and remains on monitor with 2L oxygen with sats of 95% with NAD at this time. Family at bedside.

## 2011-11-17 NOTE — ED Notes (Signed)
Pt arrives ambulatory, SOB.  Pt states that he has "been intubated before"  Brought pt back to triage room 3.  Placed on cont pulseox.  Pt is able to speak in short sentences.

## 2011-11-17 NOTE — ED Notes (Signed)
Ordered regular bfast per nurse 

## 2011-11-17 NOTE — ED Provider Notes (Signed)
Medical screening examination/treatment/procedure(s) were performed by non-physician practitioner and as supervising physician I was immediately available for consultation/collaboration.  Raeford Razor, MD 11/17/11 1655

## 2011-11-17 NOTE — ED Notes (Signed)
Patient sats on RA dropped to 91%. Placed patient on 2L oxygen via [place].

## 2011-11-22 NOTE — Progress Notes (Signed)
Observation review for 11/17/2011 is complete.

## 2011-11-23 NOTE — ED Provider Notes (Signed)
Medical screening examination/treatment/procedure(s) were conducted as a shared visit with non-physician practitioner(s) and myself.  I personally evaluated the patient during the encounter   Nat Christen, MD 11/23/11 [phone]

## 2012-02-05 ENCOUNTER — Ambulatory Visit: Payer: No Typology Code available for payment source | Admitting: Adult Health

## 2012-02-12 ENCOUNTER — Ambulatory Visit: Payer: No Typology Code available for payment source | Admitting: Adult Health

## 2012-02-19 ENCOUNTER — Ambulatory Visit (INDEPENDENT_AMBULATORY_CARE_PROVIDER_SITE_OTHER): Payer: No Typology Code available for payment source | Admitting: Pulmonary Disease

## 2012-02-19 ENCOUNTER — Encounter: Payer: Self-pay | Admitting: Pulmonary Disease

## 2012-02-19 ENCOUNTER — Telehealth: Payer: Self-pay | Admitting: Internal Medicine

## 2012-02-19 VITALS — BP 126/78 | HR 89 | Temp 98.0°F | Ht 72.0 in | Wt 187.0 lb

## 2012-02-19 DIAGNOSIS — J45901 Unspecified asthma with (acute) exacerbation: Secondary | ICD-10-CM | POA: Insufficient documentation

## 2012-02-19 MED ORDER — PREDNISONE 10 MG PO TABS: ORAL_TABLET | ORAL | Status: DC

## 2012-02-19 NOTE — Progress Notes (Signed)
Addended by: Michel Bickers A on: 02/19/2012 05:14 PM   Modules accepted: Orders

## 2012-02-19 NOTE — Telephone Encounter (Signed)
Pt scheduled to see KC today at 1:45. Carron Curie, CMA

## 2012-02-19 NOTE — Patient Instructions (Signed)
Will treat with a course of prednisone to get you thru this flareup Will give you samples of symbicort, but you will have to meet your deductible of $250 before your copay is reduced.   Can use albuterol as needed, but this does not take the place of your symbicort. followup with Dr. Sherene Sires in 2 weeks.

## 2012-02-19 NOTE — Telephone Encounter (Signed)
Pt in lobby. Wants to be seen today for SOB. Also wants samples

## 2012-02-19 NOTE — Progress Notes (Signed)
  Subjective:    Patient ID: Colton Ford, male    DOB: 14-Jun-1961, 51 y.o.   MRN: [id number]  HPI The patient comes in today for an acute sick visit.  He has known asthma that has required mechanical ventilation in the past.  He does fairly well when he stays on his medications on a regular basis, but about 3 weeks ago ran out of his symbicort.  He began to notice worsening shortness of breath as well as congestion, and also increased cough.  He now has significant wheezing and increased shortness of breath.  He denies any chest congestion or purulence.   Review of Systems  Constitutional: Negative.  Negative for fever and unexpected weight change.  HENT: Negative.  Negative for ear pain, nosebleeds, congestion, sore throat, rhinorrhea, sneezing, trouble swallowing, dental problem, postnasal drip and sinus pressure.   Eyes: Negative.  Negative for redness and itching.  Respiratory: Positive for shortness of breath and wheezing. Negative for cough and chest tightness.   Cardiovascular: Negative.  Negative for palpitations and leg swelling.  Gastrointestinal: Negative.  Negative for nausea and vomiting.  Genitourinary: Negative.  Negative for dysuria.  Musculoskeletal: Negative.  Negative for joint swelling.  Skin: Negative.  Negative for rash.  Neurological: Negative.  Negative for headaches.  Hematological: Negative.  Does not bruise/bleed easily.  Psychiatric/Behavioral: Negative.  Negative for dysphoric mood. The patient is not nervous/anxious.        Objective:   Physical Exam Wd male in nad Nose with edematous mucosa, but no purulence Op clear Chest with diffuse wheezing and rhonchi, but adequate airflow Cor with rrr LE without edema, no cyanosis Alert and oriented, moves all 4.        Assessment & Plan:

## 2012-02-19 NOTE — Assessment & Plan Note (Signed)
The patient has an acute asthma exacerbation secondary to medical noncompliance.  We'll try to figure out what medication is favored on his formulary plan, however he may have a deductible he has to meet regardless of the medication.  I have stressed to him again the importance of staying on his inhaler daily, and reviewed the pathophysiology of the inflammation of asthma.  We'll treat him with a course of prednisone, and get him back on maintenance medication, and he will need to followup with Dr. Sherene Sires in 2-3 weeks.

## 2012-03-21 ENCOUNTER — Ambulatory Visit (INDEPENDENT_AMBULATORY_CARE_PROVIDER_SITE_OTHER): Payer: No Typology Code available for payment source | Admitting: Internal Medicine

## 2012-03-21 ENCOUNTER — Encounter: Payer: Self-pay | Admitting: Internal Medicine

## 2012-03-21 VITALS — BP 142/82 | HR 86 | Temp 97.6°F | Ht 72.0 in | Wt 184.0 lb

## 2012-03-21 DIAGNOSIS — J45909 Unspecified asthma, uncomplicated: Secondary | ICD-10-CM

## 2012-03-21 MED ORDER — MOMETASONE FURO-FORMOTEROL FUM 200-5 MCG/ACT IN AERO: INHALATION_SPRAY | RESPIRATORY_TRACT | Status: DC

## 2012-03-21 MED ORDER — ALBUTEROL SULFATE HFA 108 (90 BASE) MCG/ACT IN AERS: 2.0000 | INHALATION_SPRAY | Freq: Four times a day (QID) | RESPIRATORY_TRACT | Status: DC | PRN

## 2012-03-21 NOTE — Assessment & Plan Note (Signed)
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 Improved effectiveness after extensive coaching during this visit to a level of approximately  90% from a baseline of about 50% effective    Each maintenance medication was reviewed in detail including most importantly the difference between maintenance and as needed and under what circumstances the prns are to be used.  Please see instructions for details which were reviewed in writing and the patient given a copy.

## 2012-03-21 NOTE — Progress Notes (Signed)
Subjective:     Patient ID: Colton Ford, male   DOB: 01/13/1961    MRN: [id number]  HPI  4   yobm quit smoking on 04/02/11 with severe resp distress > [name] >  ICU on ventilator with dx asthma exac.  04/10/2011 Initial pulmonary office eval usually followed by [name] with h/o childhood asthma outgrew it around the 3rd grade with one episode of ER trip 2008  And since then required intermittent albuterol sometimes not for several months at a time but since 03/20/2011 needed it daily.  Addmit 7/1-12/2010 for status asthmaticus requiring vent overnight and extubated and discharged on advair on 7/3. Works at [employer] where dusty and hot but last worked there 6/29 and has not returned. Still sob variably present sitting still and some worse walking across the parking lot. No purulent sputum.   rec No change rx, reviewed difference between saba and maint rx   02/19/12 acute ov [name] asthma exac off symbicort x one month Will treat with a course of prednisone to get you thru this flareup Will give you samples of symbicort, but you will have to meet your deductible of $250 before your copay is reduced.   Can use albuterol as needed, but this does not take the place of your symbicort   03/21/2012 f/u ov/[name] cc breathing much better but not quite baseline since last flare brought on because he couldn't afford his symbicort and doesn't even have a rescue inhaler at present. Doe x > slow adl's,  But no cough or real variability or overt sinus/ hb symptoms.   Pt denies any significant sore throat, dysphagia, itching, sneezing,  nasal congestion or excess/ purulent secretions,  fever, chills, sweats, unintended wt loss, pleuritic or exertional cp, hempoptysis, orthopnea pnd or leg swelling.    Also denies any obvious fluctuation of symptoms with weather or environmental changes or other aggravating or alleviating factors.               Objective:   Physical Exam Pleasant amb relatively  thin bm nad Wt 172 04/10/2011 > 03/21/2012 184 HEENT mild turbinate edema.  Oropharynx no thrush or excess pnd or cobblestoning.  No JVD or cervical adenopathy. Mild accessory muscle hypertrophy. Trachea midline, nl thryroid. Chest was hyperinflated by percussion with diminished breath sounds and moderate increased exp time with trace bilateral  late exp wheeze. Hoover sign positive at mid inspiration. Regular rate and rhythm without murmur gallop or rub or increase P2 or edema.  Abd: no hsm, nl excursion. Ext warm without cyanosis or clubbing.      Assessment:          Plan:

## 2012-03-21 NOTE — Patient Instructions (Addendum)
Dulera 200 Take 2 puffs first thing in am and then another 2 puffs about 12 hours later.   Only use your albuterol(proventil/yellow inhaler)  as a rescue medication to be used if you can't catch your breath by resting or doing a relaxed purse lip breathing pattern. The less you use it, the better it will work when you need it.   Please schedule a follow up office visit in 6 weeks, call sooner if needed with pft's

## 2012-05-02 ENCOUNTER — Ambulatory Visit (INDEPENDENT_AMBULATORY_CARE_PROVIDER_SITE_OTHER): Payer: No Typology Code available for payment source | Admitting: Internal Medicine

## 2012-05-02 ENCOUNTER — Encounter: Payer: Self-pay | Admitting: Internal Medicine

## 2012-05-02 VITALS — BP 130/84 | HR 75 | Temp 97.7°F | Ht 72.0 in | Wt 182.0 lb

## 2012-05-02 DIAGNOSIS — J45909 Unspecified asthma, uncomplicated: Secondary | ICD-10-CM

## 2012-05-02 LAB — PULMONARY FUNCTION TEST

## 2012-05-02 NOTE — Patient Instructions (Addendum)
If your breathing worsens or you need to use your rescue inhaler more than twice weekly or wake up more than twice a month with any respiratory symptoms or require more than two rescue inhalers per year, we need to see you right away (Tammy NP or Duante Arocho MD) because this means we're not controlling the underlying problem (inflammation) adequately.  Rescue inhalers do not control inflammation and overuse can lead to unnecessary and costly consequences.  They can make you feel better temporarily but eventually they will quit working effectively much as sleep aids lead to more insomnia if used regularly.   Please schedule a follow up visit in 3 months but call sooner if needed

## 2012-05-02 NOTE — Assessment & Plan Note (Addendum)
-   HFA 90%05/02/2012      - Short term disability from 7/1 - 04/26/11      - PFT's 04/25/2011 FEV1  2.66 (72%) ratio 56 and 13% better p B2 with DLC0 87%     - PFT's 05/02/2012 FEV1  1.53 (42% ratio 47 and improved to 2.05 p B2 (34%) DLCO 67  GOLD II but very high risk having been ventilated previously for exacerbation DDX of  difficult airways managment all start with A and  include Adherence, Ace Inhibitors, Acid Reflux, Active Sinus Disease, Alpha 1 Antitripsin deficiency, Anxiety masquerading as Airways dz,  ABPA,  allergy(esp in young), Aspiration (esp in elderly), Adverse effects of DPI,  Active smokers, plus two Bs  = Bronchiectasis and Beta blocker use..and one C= CHF  Adherence is always the initial "prime suspect" and is a multilayered concern that requires a "trust but verify" approach in every patient - starting with knowing how to use medications, especially inhalers, correctly, keeping up with refills and understanding the fundamental difference between maintenance and prns vs those medications only taken for a very short course and then stopped and not refilled. The proper method of use, as well as anticipated side effects, of a metered-dose inhaler are discussed and demonstrated to the patient. Improved effectiveness after extensive coaching during this visit to a level of approximately  90%.  Explained insurance issues, rule of two's  See instructions for specific recommendations which were reviewed directly with the patient who was given a copy with highlighter outlining the key components.

## 2012-05-02 NOTE — Progress Notes (Signed)
PFT done today. 

## 2012-05-02 NOTE — Progress Notes (Signed)
Subjective:     Patient ID: Colton Ford, male   DOB: 06/05/1961    MRN: [id number]  HPI  84   yobm quit smoking on 04/02/11 with severe resp distress > [place] >  ICU on ventilator with dx asthma exac.  04/10/2011 Initial pulmonary office eval usually followed by [name] with h/o childhood asthma outgrew it around the 3rd grade with one episode of ER trip 2008  And since then required intermittent albuterol sometimes not for several months at a time but since 03/20/2011 needed it daily.  Addmit 7/1-12/2010 for status asthmaticus requiring vent overnight and extubated and discharged on advair on 7/3. Works at [employer] where dusty and hot but last worked there 6/29 and has not returned. Still sob variably present sitting still and some worse walking across the parking lot. No purulent sputum.   rec No change rx, reviewed difference between saba and maint rx   02/19/12 acute ov [name] asthma exac off symbicort x one month Will treat with a course of prednisone to get you thru this flareup Will give you samples of symbicort, but you will have to meet your deductible of $250 before your copay is reduced.   Can use albuterol as needed, but this does not take the place of your symbicort   03/21/2012 f/u ov/Beatrice Sehgal cc breathing much better but not quite baseline since last flare brought on because he couldn't afford his symbicort and doesn't even have a rescue inhaler at present. Doe x > slow adl's,  But no cough or real variability or overt sinus/ hb symptoms. rec Dulera 200 Take 2 puffs first thing in am and then another 2 puffs about 12 hours later.  Only use your albuterol(proventil/yellow inhaler)  as a rescue medication  Please schedule a follow up office visit in 6 weeks, call sooner if needed with pft's  05/02/2012 f/u ov/Laylia Mui cc doing fine while on dulera but couldn't refill per his insurance stopped x one week with increase need for mostly daytime saba since ran out.   No unusual cough,  purulent sputum or sinus/hb symptoms on present rx.   Pt denies any significant sore throat, dysphagia, itching, sneezing,  nasal congestion or excess/ purulent secretions,  fever, chills, sweats, unintended wt loss, pleuritic or exertional cp, hempoptysis, orthopnea pnd or leg swelling.    Also denies any obvious fluctuation of symptoms with weather or environmental changes or other aggravating or alleviating factors.               Objective:   Physical Exam Pleasant amb relatively thin bm nad Wt 172 04/10/2011 > 03/21/2012 184 > 05/02/2012  HEENT mild turbinate edema.  Oropharynx no thrush or excess pnd or cobblestoning.  No JVD or cervical adenopathy. Mild accessory muscle hypertrophy. Trachea midline, nl thryroid. Chest was hyperinflated by percussion with diminished breath sounds and moderate increased exp time with trace bilateral  late exp wheeze. Hoover sign positive at mid inspiration. Regular rate and rhythm without murmur gallop or rub or increase P2 or edema.  Abd: no hsm, nl excursion. Ext warm without cyanosis or clubbing.      cxr 11/17/11 1. No acute cardiopulmonary disease.  2. Emphysema.  Assessment:          Plan:

## 2012-07-04 ENCOUNTER — Telehealth: Payer: Self-pay | Admitting: Internal Medicine

## 2012-07-04 NOTE — Telephone Encounter (Signed)
Called pt and left message x3 to make a follow up apt. No return call back. Sent letter 07/04/12 for pt to call and schd follow up.  °

## 2012-07-24 ENCOUNTER — Encounter: Payer: Self-pay | Admitting: Internal Medicine

## 2012-07-24 ENCOUNTER — Ambulatory Visit (INDEPENDENT_AMBULATORY_CARE_PROVIDER_SITE_OTHER): Payer: No Typology Code available for payment source | Admitting: Internal Medicine

## 2012-07-24 VITALS — BP 142/90 | HR 75 | Temp 98.3°F | Ht 72.0 in | Wt 183.0 lb

## 2012-07-24 DIAGNOSIS — J449 Chronic obstructive pulmonary disease, unspecified: Secondary | ICD-10-CM

## 2012-07-24 MED ORDER — MOMETASONE FURO-FORMOTEROL FUM 200-5 MCG/ACT IN AERO: INHALATION_SPRAY | RESPIRATORY_TRACT | Status: DC

## 2012-07-24 NOTE — Patient Instructions (Addendum)
Continue Plan A = dulera 200 Take 2 puffs first thing in am and then another 2 puffs about 12 hours later.     Only use your albuterol (Plan B =xopenex or proventil) as a rescue medication to be used if you can't catch your breath by resting or doing a relaxed purse lip breathing pattern. The less you use it, the better it will work when you need it.    If you are satisfied with your treatment plan let your doctor know and he/she can either refill your medications or you can return here when your prescription runs out (or see my NP Tammy)   If in any way you are not 100% satisfied,  please tell us.  If 100% better, tell your friends!

## 2012-07-24 NOTE — Assessment & Plan Note (Addendum)
-   HFA 90% 05/02/2012      - Short term disability from 7/1 - 04/26/11      - PFT's 04/25/2011 FEV1  2.66 (72%) ratio 56 and 13% better p B2 with DLC0 87%     - PFT's 05/02/2012 FEV1  1.53 (42%)  ratio 47 and improved to 2.05 p B2 (34%) DLCO 67  Acts more like severe chronic asthma than copd at this point so critical he maintain off cigarettes and on dulera 200 as one trip to the ER (which he should be able to avoid)  would pay for a year's supply of dulera (which he should never run out of if he wants to avoid spending the $ on ER)    Each maintenance medication was reviewed in detail including most importantly the difference between maintenance and as needed and under what circumstances the prns are to be used.  Please see instructions for details which were reviewed in writing and the patient given a copy.   Rule of 2's and Plan A vs B also reviewed

## 2012-07-24 NOTE — Progress Notes (Signed)
Subjective:     Patient ID: Colton Ford, male   DOB: Jun 26, 1961    MRN: [id number]  HPI  30   yobm quit smoking on 04/02/11 with severe resp distress > [place] >  ICU on ventilator with dx asthma exac.  04/10/2011 Initial pulmonary office eval usually followed by [name] with h/o childhood asthma outgrew it around the 3rd grade with one episode of ER trip 2008  And since then required intermittent albuterol sometimes not for several months at a time but since 03/20/2011 needed it daily.  Addmit 7/1-12/2010 for status asthmaticus requiring vent overnight and extubated and discharged on advair on 7/3. Works at [employer] where dusty and hot but last worked there 6/29 and has not returned. Still sob variably present sitting still and some worse walking across the parking lot. No purulent sputum.   rec No change rx, reviewed difference between saba and maint rx   02/19/12 acute ov [name] asthma exac off symbicort x one month Will treat with a course of prednisone to get you thru this flareup Will give you samples of symbicort, but you will have to meet your deductible of $250 before your copay is reduced.   Can use albuterol as needed, but this does not take the place of your symbicort   03/21/2012 f/u ov/Aliyha Fornes cc breathing much better but not quite baseline since last flare brought on because he couldn't afford his symbicort and doesn't even have a rescue inhaler at present. Doe x > slow adl's,  But no cough or real variability or overt sinus/ hb symptoms. rec Dulera 200 Take 2 puffs first thing in am and then another 2 puffs about 12 hours later.  Only use your albuterol(proventil/yellow inhaler)  as a rescue medication  Please schedule a follow up office visit in 6 weeks, call sooner if needed with pft's  05/02/2012 f/u ov/[name] cc doing fine while on dulera but couldn't refill per his insurance stopped x one week with increase need for mostly daytime saba since ran out.    rec Rule of 2's  reviewed   07/24/2012 f/u ov/[name] cc doing fine x has to spend the first $250 before his insurance helps him pay for dulera, says when he takes it consistently breathing is fine and no cough or need for daytime saba  Sleeping ok without nocturnal  or early am exacerbation  of respiratory  c/o's or need for noct saba. Also denies any obvious fluctuation of symptoms with weather or environmental changes or other aggravating or alleviating factors except as outlined above  ROS  The following are not active complaints unless bolded sore throat, dysphagia, dental problems, itching, sneezing,  nasal congestion or excess/ purulent secretions, ear ache,   fever, chills, sweats, unintended wt loss, pleuritic or exertional cp, hemoptysis,  orthopnea pnd or leg swelling, presyncope, palpitations, heartburn, abdominal pain, anorexia, nausea, vomiting, diarrhea  or change in bowel or urinary habits, change in stools or urine, dysuria,hematuria,  rash, arthralgias, visual complaints, headache, numbness weakness or ataxia or problems with walking or coordination,  change in mood/affect or memory.          Objective:   Physical Exam Pleasant amb relatively thin bm nad Wt 172 04/10/2011 > 03/21/2012 184 > 07/24/2012  183  HEENT mild turbinate edema.  Oropharynx no thrush or excess pnd or cobblestoning.  No JVD or cervical adenopathy. Mild accessory muscle hypertrophy. Trachea midline, nl thryroid. Chest was hyperinflated by percussion with diminished breath sounds and moderate  increased exp time with trace bilateral  late exp wheeze. Hoover sign positive at mid inspiration. Regular rate and rhythm without murmur gallop or rub or increase P2 or edema.  Abd: no hsm, nl excursion. Ext warm without cyanosis or clubbing.      cxr 11/17/11 1. No acute cardiopulmonary disease.  2. Emphysema.  Assessment:          Plan:

## 2012-11-06 ENCOUNTER — Ambulatory Visit: Payer: No Typology Code available for payment source | Admitting: Internal Medicine

## 2012-11-15 ENCOUNTER — Ambulatory Visit: Payer: No Typology Code available for payment source | Admitting: Internal Medicine

## 2012-11-20 ENCOUNTER — Ambulatory Visit (INDEPENDENT_AMBULATORY_CARE_PROVIDER_SITE_OTHER): Payer: No Typology Code available for payment source | Admitting: Internal Medicine

## 2012-11-20 ENCOUNTER — Encounter: Payer: Self-pay | Admitting: Internal Medicine

## 2012-11-20 VITALS — BP 140/90 | HR 96 | Temp 98.4°F | Ht 72.0 in | Wt 190.8 lb

## 2012-11-20 DIAGNOSIS — J449 Chronic obstructive pulmonary disease, unspecified: Secondary | ICD-10-CM

## 2012-11-20 NOTE — Progress Notes (Signed)
Subjective:     Patient ID: Colton Ford, male   DOB: [birth year]    MRN: [id number]  HPI  91   yobm quit smoking on 04/02/11 with severe resp distress > [place] >  ICU on ventilator with dx asthma exac.  04/10/2011 Initial pulmonary office eval usually followed by [name] with h/o childhood asthma outgrew it around the 3rd grade with one episode of ER trip 2008  And since then required intermittent albuterol sometimes not for several months at a time but since 03/20/2011 needed it daily.  Addmit 7/1-12/2010 for status asthmaticus requiring vent overnight and extubated and discharged on advair on 7/3. Works at [employer] where dusty and hot but last worked there 6/29 and has not returned. Still sob variably present sitting still and some worse walking across the parking lot. No purulent sputum.   rec No change rx, reviewed difference between saba and maint rx   02/19/12 acute ov [name] asthma exac off symbicort x one month Will treat with a course of prednisone to get you thru this flareup Will give you samples of symbicort, but you will have to meet your deductible of $250 before your copay is reduced.   Can use albuterol as needed, but this does not take the place of your symbicort   03/21/2012 f/u ov/Wert cc breathing much better but not quite baseline since last flare brought on because he couldn't afford his symbicort and doesn't even have a rescue inhaler at present. Doe x > slow adl's,  But no cough or real variability or overt sinus/ hb symptoms. rec Dulera 200 Take 2 puffs first thing in am and then another 2 puffs about 12 hours later.  Only use your albuterol(proventil/yellow inhaler)  as a rescue medication  Please schedule a follow up office visit in 6 weeks, call sooner if needed with pft's  05/02/2012 f/u ov/Wert cc doing fine while on dulera but couldn't refill per his insurance stopped x one week with increase need for mostly daytime saba since ran out.    rec Rule of 2's  reviewed   07/24/2012 f/u ov/Wert cc doing fine x has to spend the first $250 before his insurance helps him pay for dulera, says when he takes it consistently breathing is fine and no cough or need for daytime saba rec No change rx  11/20/2012 f/u ov/Wert cc breathing does fine as long as on dulera, w/in a week of dulera end up needing the rescue inhaler. Able to play basketball no problem  No obvious daytime variabilty or assoc chronic cough or cp or chest tightness, subjective wheeze overt sinus or hb symptoms. No unusual exp hx or h/o childhood pna/ asthma or premature birth to his knowledge.    Sleeping ok without nocturnal  or early am exacerbation  of respiratory  c/o's or need for noct saba. Also denies any obvious fluctuation of symptoms with weather or environmental changes or other aggravating or alleviating factors except as outlined above  ROS  The following are not active complaints unless bolded sore throat, dysphagia, dental problems, itching, sneezing,  nasal congestion or excess/ purulent secretions, ear ache,   fever, chills, sweats, unintended wt loss, pleuritic or exertional cp, hemoptysis,  orthopnea pnd or leg swelling, presyncope, palpitations, heartburn, abdominal pain, anorexia, nausea, vomiting, diarrhea  or change in bowel or urinary habits, change in stools or urine, dysuria,hematuria,  rash, arthralgias, visual complaints, headache, numbness weakness or ataxia or problems with walking or coordination,  change in  mood/affect or memory.          Objective:   Physical Exam Pleasant amb relatively thin bm nad Wt 172 04/10/2011 > 03/21/2012 184 > 07/24/2012  183 > 11/20/2012  HEENT mild turbinate edema.  Oropharynx no thrush or excess pnd or cobblestoning.  No JVD or cervical adenopathy. Mild accessory muscle hypertrophy. Trachea midline, nl thryroid. Chest was hyperinflated by percussion with diminished breath sounds and moderate increased exp time with trace bilateral   late exp wheeze. Hoover sign positive at mid inspiration. Regular rate and rhythm without murmur gallop or rub or increase P2 or edema.  Abd: no hsm, nl excursion. Ext warm without cyanosis or clubbing.      cxr 11/17/11 1. No acute cardiopulmonary disease.  2. Emphysema.  Assessment:          Plan:

## 2012-11-20 NOTE — Assessment & Plan Note (Addendum)
-   HFA 90% 05/02/2012      - Short term disability from 7/1 - 04/26/11      - PFT's 04/25/2011 FEV1  2.66 (72%) ratio 56 and 13% better p B2 with DLC0 87%     - PFT's 05/02/2012 FEV1  1.53 (42%)  ratio 47 and improved to 2.05 p B2 (34%) DLCO 67  Adequate control on present rx, reviewed limited options as there is no generic dulera or laba/ics combo,  On which he does great as long as he stays on his dulera.   Supplied coupons and samples and referred back to primary, can see prn

## 2012-11-20 NOTE — Patient Instructions (Addendum)
If you are satisfied with your treatment plan let your doctor know and he/she can either refill your medications or you can return here when your prescription runs out.     If in any way you are not 100% satisfied,  please tell us.  If 100% better, tell your friends!  

## 2013-02-03 ENCOUNTER — Other Ambulatory Visit: Payer: Self-pay | Admitting: Adult Health

## 2013-02-03 ENCOUNTER — Encounter: Payer: Self-pay | Admitting: Adult Health

## 2013-02-03 ENCOUNTER — Ambulatory Visit (INDEPENDENT_AMBULATORY_CARE_PROVIDER_SITE_OTHER): Payer: No Typology Code available for payment source | Admitting: Adult Health

## 2013-02-03 VITALS — BP 144/94 | HR 78 | Temp 98.3°F | Ht 72.0 in | Wt 189.0 lb

## 2013-02-03 DIAGNOSIS — J449 Chronic obstructive pulmonary disease, unspecified: Secondary | ICD-10-CM

## 2013-02-03 MED ORDER — PREDNISONE 10 MG PO TABS: ORAL_TABLET | ORAL | Status: DC

## 2013-02-03 MED ORDER — LEVALBUTEROL HCL 0.63 MG/3ML IN NEBU
0.6300 mg | INHALATION_SOLUTION | Freq: Once | RESPIRATORY_TRACT | Status: AC
  Administered 2013-02-03: 0.63 mg via RESPIRATORY_TRACT

## 2013-02-03 NOTE — Progress Notes (Signed)
Subjective:     Patient ID: Colton Ford, male   DOB: 06-26-1961    MRN: [id number]  HPI  30   yobm quit smoking on 04/02/11 with severe resp distress > [place] >  ICU on ventilator with dx asthma exac.  04/10/2011 Initial pulmonary office eval usually followed by [name] with h/o childhood asthma outgrew it around the 3rd grade with one episode of ER trip 2008  And since then required intermittent albuterol sometimes not for several months at a time but since 03/20/2011 needed it daily.  Addmit 7/1-12/2010 for status asthmaticus requiring vent overnight and extubated and discharged on advair on 7/3. Works at [employer] where dusty and hot but last worked there 6/29 and has not returned. Still sob variably present sitting still and some worse walking across the parking lot. No purulent sputum.   rec No change rx, reviewed difference between saba and maint rx   02/19/12 acute ov [name] asthma exac off symbicort x one month Will treat with a course of prednisone to get you thru this flareup Will give you samples of symbicort, but you will have to meet your deductible of $250 before your copay is reduced.   Can use albuterol as needed, but this does not take the place of your symbicort   03/21/2012 f/u ov/Wert cc breathing much better but not quite baseline since last flare brought on because he couldn't afford his symbicort and doesn't even have a rescue inhaler at present. Doe x > slow adl's,  But no cough or real variability or overt sinus/ hb symptoms. rec Dulera 200 Take 2 puffs first thing in am and then another 2 puffs about 12 hours later.  Only use your albuterol(proventil/yellow inhaler)  as a rescue medication  Please schedule a follow up office visit in 6 weeks, call sooner if needed with pft's  05/02/2012 f/u ov/Wert cc doing fine while on dulera but couldn't refill per his insurance stopped x one week with increase need for mostly daytime saba since ran out.    rec Rule of 2's  reviewed   07/24/2012 f/u ov/Wert cc doing fine x has to spend the first $250 before his insurance helps him pay for dulera, says when he takes it consistently breathing is fine and no cough or need for daytime saba rec No change rx  11/20/2012 f/u ov/Wert cc breathing does fine as long as on dulera, w/in a week of dulera end up needing the rescue inhaler. Able to play basketball no problem >>no changes   02/03/2013 Acute OV  increased SOB, wheezing, chest tightness, prod cough with clear mucus x3 days since running out of Dulera.  denies f/c/s Difficulty affording inhalers.  CAT 8.  No discolored mucus, fever, chest pain, edema or n/v.  No recent travel or abx use.  Med assistance papers given   ROS Neg except HPI     Objective:   Physical Exam Pleasant amb relatively thin bm nad Wt 172 04/10/2011 > 03/21/2012 184 > 07/24/2012  183 > 11/20/2012 >189 02/03/2013  HEENT mild turbinate edema.  Oropharynx no thrush or excess pnd or cobblestoning.  No JVD or cervical adenopathy. Mild accessory muscle hypertrophy. Trachea midline, nl thryroid.  CHest : exp wheezing .  Regular rate and rhythm without murmur gallop or rub or increase P2 or edema.  Abd: no hsm, nl excursion. Ext warm without cyanosis or clubbing.      cxr 11/17/11 1. No acute cardiopulmonary disease.  2. Emphysema.  Assessment:          Plan:

## 2013-02-03 NOTE — Addendum Note (Signed)
Addended by: Julio Sicks on: 02/03/2013 12:15 PM   Modules accepted: Orders

## 2013-02-03 NOTE — Patient Instructions (Addendum)
Prednisone taper over next week.  Mucinex DM Twice daily  .As needed  Cough/congestion  Restart Dulera 2 puffs Twice daily   Follow up Dr. Sherene Sires  In 2-3 months and As needed   Please contact office for sooner follow up if symptoms do not improve or worsen or seek emergency care

## 2013-02-03 NOTE — Assessment & Plan Note (Signed)
Exacerbation off inhalers xopenex neb x 1   Plan  Prednisone taper over next week.  Mucinex DM Twice daily  .As needed  Cough/congestion  Restart Dulera 2 puffs Twice daily   Follow up Dr. Sherene Sires  In 2-3 months and As needed   Please contact office for sooner follow up if symptoms do not improve or worsen or seek emergency care

## 2013-02-03 NOTE — Addendum Note (Signed)
Addended by: Reynaldo Minium C on: 02/03/2013 12:23 PM   Modules accepted: Orders

## 2013-02-05 MED ORDER — LEVALBUTEROL HCL 0.63 MG/3ML IN NEBU
0.6300 mg | INHALATION_SOLUTION | Freq: Once | RESPIRATORY_TRACT | Status: AC
  Administered 2013-02-05: 0.63 mg via RESPIRATORY_TRACT

## 2013-02-05 NOTE — Addendum Note (Signed)
Addended by: Boone Master E on: 02/05/2013 05:03 PM   Modules accepted: Orders

## 2013-04-07 ENCOUNTER — Ambulatory Visit: Payer: No Typology Code available for payment source | Admitting: Internal Medicine

## 2013-04-14 ENCOUNTER — Telehealth: Payer: Self-pay | Admitting: Internal Medicine

## 2013-04-14 MED ORDER — MOMETASONE FURO-FORMOTEROL FUM 200-5 MCG/ACT IN AERO: INHALATION_SPRAY | RESPIRATORY_TRACT | Status: DC

## 2013-04-14 NOTE — Telephone Encounter (Signed)
I spoke with pt. He is requesting sample of dulera. He has been out and feels that is why he is SOB. I advised pt can leave 1 sample upfront for pick up. Per pt he wants to wait until he see's MW on wed for appt. I advised pt tcb if he worsens. He voiced his understanding. Sample left upfront fo rp/u.

## 2013-04-16 ENCOUNTER — Ambulatory Visit (INDEPENDENT_AMBULATORY_CARE_PROVIDER_SITE_OTHER): Payer: No Typology Code available for payment source | Admitting: Internal Medicine

## 2013-04-16 ENCOUNTER — Encounter: Payer: Self-pay | Admitting: Internal Medicine

## 2013-04-16 VITALS — BP 138/88 | HR 77 | Temp 97.3°F | Ht 72.0 in | Wt 185.0 lb

## 2013-04-16 DIAGNOSIS — J449 Chronic obstructive pulmonary disease, unspecified: Secondary | ICD-10-CM

## 2013-04-16 MED ORDER — MOMETASONE FURO-FORMOTEROL FUM 200-5 MCG/ACT IN AERO: INHALATION_SPRAY | RESPIRATORY_TRACT | Status: DC

## 2013-04-16 NOTE — Progress Notes (Signed)
Subjective:     Patient ID: Colton Ford, male   DOB: 11/29/1960    MRN: [id number]    Brief patient profile:  52   yobm quit smoking on 04/02/11 with severe resp distress > [place] >  ICU on ventilator with dx asthma exac.  04/10/2011 Initial pulmonary office eval usually followed by Colton Ford with h/o childhood asthma outgrew it around the 3rd grade with one episode of ER trip 2008  And since then required intermittent albuterol sometimes not for several months at a time but since 03/20/2011 needed it daily.  Addmit 7/1-12/2010 for status asthmaticus requiring vent overnight and extubated and discharged on advair on 7/3. Works at [employer] where dusty and hot but last worked there 6/29 and has not returned. Still sob variably present sitting still and some worse walking across the parking lot. No purulent sputum.   rec No change rx, reviewed difference between saba and maint rx   02/19/12 acute ov Colton Ford asthma exac off symbicort x one month Will treat with a course of prednisone to get you thru this flareup Will give you samples of symbicort, but you will have to meet your deductible of $250 before your copay is reduced.   Can use albuterol as needed, but this does not take the place of your symbicort   03/21/2012 f/u ov/Colton Ford cc breathing much better but not quite baseline since last flare brought on because he couldn't afford his symbicort and doesn't even have a rescue inhaler at present. Doe x > slow adl's,  But no cough or real variability or overt sinus/ hb symptoms. rec Dulera 200 Take 2 puffs first thing in am and then another 2 puffs about 12 hours later.  Only use your albuterol(proventil/yellow inhaler)  as a rescue medication  Please schedule a follow up office visit in 6 weeks, call sooner if needed with pft's  05/02/2012 f/u ov/Colton Ford cc doing fine while on dulera but couldn't refill per his insurance stopped x one week with increase need for mostly daytime saba since ran out.     rec Rule of 2's reviewed    02/03/13 acute ov /NP aecopd rec Prednisone taper over next week.  Mucinex DM Twice daily  .As needed  Cough/congestion  Restart Dulera 2 puffs Twice daily     04/16/2013 f/u ov/Colton Ford  Out of dulera again due to finances  Chief Complaint  Patient presents with  . Acute Visit    Pt states breathing back at his normal baseline. Cough and wheezing have resolved. No new co's today.    notes increase saba need each time he runs out of dulera.    No obvious daytime variabilty or assoc chronic cough or cp or chest tightness, subjective wheeze overt sinus or hb symptoms. No unusual exp hx or h/o childhood pna/ asthma or premature birth to his knowledge.    Sleeping ok without nocturnal  or early am exacerbation  of respiratory  c/o's or need for noct saba. Also denies any obvious fluctuation of symptoms with weather or environmental changes or other aggravating or alleviating factors except as outlined above  ROS  The following are not active complaints unless bolded sore throat, dysphagia, dental problems, itching, sneezing,  nasal congestion or excess/ purulent secretions, ear ache,   fever, chills, sweats, unintended wt loss, pleuritic or exertional cp, hemoptysis,  orthopnea pnd or leg swelling, presyncope, palpitations, heartburn, abdominal pain, anorexia, nausea, vomiting, diarrhea  or change in bowel or urinary habits, change in  stools or urine, dysuria,hematuria,  rash, arthralgias, visual complaints, headache, numbness weakness or ataxia or problems with walking or coordination,  change in mood/affect or memory.          Objective:   Physical Exam Pleasant amb relatively thin bm nad Wt 172 04/10/2011 > 03/21/2012 184 > 07/24/2012  183 >  04/16/2013 185  HEENT mild turbinate edema.  Oropharynx no thrush or excess pnd or cobblestoning.  No JVD or cervical adenopathy. Mild accessory muscle hypertrophy. Trachea midline, nl thryroid. Chest was hyperinflated by  percussion with diminished breath sounds and moderate increased exp time with trace bilateral  late exp wheeze. Hoover sign positive at mid inspiration. Regular rate and rhythm without murmur gallop or rub or increase P2 or edema.  Abd: no hsm, nl excursion. Ext warm without cyanosis or clubbing.      cxr 11/17/11 1. No acute cardiopulmonary disease.  2. Emphysema.  Assessment:          Plan:

## 2013-04-16 NOTE — Patient Instructions (Addendum)
Continue dulera 200 Take 2 puffs first thing in am and then another 2 puffs about 12 hours later.   Please schedule a follow up visit in 2  months but call sooner if needed to see Colton Ford on return and work out a plan for access to your medications

## 2013-04-18 NOTE — Assessment & Plan Note (Addendum)
-   Short term disability from 7/1 - 04/26/11      - PFT's 04/25/2011 FEV1  2.66 (72%) ratio 56 and 13% better p B2 with DLC0 87%     - PFT's 05/02/2012 FEV1  1.53 (42%)  ratio 47 and improved to 2.05 p B2 (34%) DLCO 67  DDX of  difficult airways managment all start with A and  include Adherence, Ace Inhibitors, Acid Reflux, Active Sinus Disease, Alpha 1 Antitripsin deficiency, Anxiety masquerading as Airways dz,  ABPA,  allergy(esp in young), Aspiration (esp in elderly), Adverse effects of DPI,  Active smokers, plus two Bs  = Bronchiectasis and Beta blocker use..and one C= CHF   Adherence is always the initial "prime suspect" and is a multilayered concern that requires a "trust but verify" approach in every patient - starting with knowing how to use medications, especially inhalers, correctly, keeping up with refills and understanding the fundamental difference between maintenance and prns vs those medications only taken for a very short course and then stopped and not refilled.   Clearly this is his major problem, reviewed options to acquire his ics/laba   rec  dulera 200 One month samples, one free month coupon> f/u 2 months    Each maintenance medication was reviewed in detail including most importantly the difference between maintenance and as needed and under what circumstances the prns are to be used.  Please see instructions for details which were reviewed in writing and the patient given a copy.

## 2013-06-17 ENCOUNTER — Ambulatory Visit: Payer: No Typology Code available for payment source | Admitting: Internal Medicine

## 2013-08-18 ENCOUNTER — Encounter: Payer: Self-pay | Admitting: Internal Medicine

## 2013-08-18 ENCOUNTER — Ambulatory Visit (INDEPENDENT_AMBULATORY_CARE_PROVIDER_SITE_OTHER): Payer: Commercial Managed Care - PPO | Admitting: Internal Medicine

## 2013-08-18 VITALS — BP 144/96 | HR 86 | Temp 97.3°F | Ht 72.0 in | Wt 182.0 lb

## 2013-08-18 DIAGNOSIS — J449 Chronic obstructive pulmonary disease, unspecified: Secondary | ICD-10-CM

## 2013-08-18 MED ORDER — BUDESONIDE-FORMOTEROL FUMARATE 160-4.5 MCG/ACT IN AERO: INHALATION_SPRAY | RESPIRATORY_TRACT | Status: DC

## 2013-08-18 MED ORDER — ALBUTEROL SULFATE HFA 108 (90 BASE) MCG/ACT IN AERS: 2.0000 | INHALATION_SPRAY | Freq: Four times a day (QID) | RESPIRATORY_TRACT | Status: DC | PRN

## 2013-08-18 NOTE — Patient Instructions (Addendum)
Either use dulera 200 or symbicort 160 Take 2 puffs first thing in am and then another 2 puffs about 12 hours later.     Only use your albuterol (proaire) as a rescue medication to be used if you can't catch your breath by resting or doing a relaxed purse lip breathing pattern.  - The less you use it, the better it will work when you need it. - Ok to use up to every 4 hours if you must but call for immediate appointment if use goes up over your usual need - Don't leave home without it !!  (think of it like your spare tire for your car)    If you are satisfied with your treatment plan let your doctor know and he/she can either refill your medications or you can return here when your prescription runs out.     If in any way you are not 100% satisfied,  please tell us.  If 100% better, tell your friends!

## 2013-08-18 NOTE — Progress Notes (Signed)
Subjective:     Patient ID: Colton Ford, male   DOB: [birth year]    MRN: [id number]    Brief patient profile:  52   yobm quit smoking on 04/02/11 with severe resp distress > [place] >  ICU on ventilator with dx asthma exac.  04/10/2011 Initial pulmonary office eval usually followed by [name] with h/o childhood asthma outgrew it around the 3rd grade with one episode of ER trip 2008  And since then required intermittent albuterol sometimes not for several months at a time but since 03/20/2011 needed it daily.  Addmit 7/1-12/2010 for status asthmaticus requiring vent overnight and extubated and discharged on advair on 7/3. Works at [employer] where dusty and hot but last worked there 6/29 and has not returned. Still sob variably present sitting still and some worse walking across the parking lot. No purulent sputum.   rec No change rx, reviewed difference between saba and maint rx   02/19/12 acute ov Colton Ford asthma exac off symbicort x one month Will treat with a course of prednisone to get you thru this flareup Will give you samples of symbicort, but you will have to meet your deductible of $250 before your copay is reduced.   Can use albuterol as needed, but this does not take the place of your symbicort   03/21/2012 f/u ov/Vernel Langenderfer cc breathing much better but not quite baseline since last flare brought on because he couldn't afford his symbicort and doesn't even have a rescue inhaler at present. Doe x > slow adl's,  But no cough or real variability or overt sinus/ hb symptoms. rec Dulera 200 Take 2 puffs first thing in am and then another 2 puffs about 12 hours later.  Only use your albuterol(proventil/yellow inhaler)  as a rescue medication  Please schedule a follow up office visit in 6 weeks, call sooner if needed with pft's  05/02/2012 f/u ov/Latondra Gebhart cc doing fine while on dulera but couldn't refill per his insurance stopped x one week with increase need for mostly daytime saba since ran out.     rec Rule of 2's reviewed    02/03/13 acute ov /NP aecopd rec Prednisone taper over next week.  Mucinex DM Twice daily  .As needed  Cough/congestion  Restart Dulera 2 puffs Twice daily     04/16/2013 f/u ov/Darcel Frane  Out of dulera again due to finances  Chief Complaint  Patient presents with  . Acute Visit    Pt states breathing back at his normal baseline. Cough and wheezing have resolved. No new co's today.    notes increase saba need each time he runs out of dulera.  rec Continue dulera 200 Take 2 puffs first thing in am and then another 2 puffs about 12 hours later.    08/18/2013 f/u ov/Estephany Perot re: asthma flare/ again out of dulera Chief Complaint  Patient presents with  . Acute Visit    Pt c/o SOB constantly, congestion, prod cough with green mucous   using saba up to 4 x daily to control chest tightness and wheezing   No obvious daytime variabilty or assoc chronic cp or chest tightness,  overt sinus or hb symptoms. No unusual exp hx or h/o childhood pna/ asthma or premature birth to his knowledge.     Also denies any obvious fluctuation of symptoms with weather or environmental changes or other aggravating or alleviating factors except as outlined above  ROS  The following are not active complaints unless bolded sore throat, dysphagia, dental  problems, itching, sneezing,  nasal congestion or excess/ purulent secretions, ear ache,   fever, chills, sweats, unintended wt loss, pleuritic or exertional cp, hemoptysis,  orthopnea pnd or leg swelling, presyncope, palpitations, heartburn, abdominal pain, anorexia, nausea, vomiting, diarrhea  or change in bowel or urinary habits, change in stools or urine, dysuria,hematuria,  rash, arthralgias, visual complaints, headache, numbness weakness or ataxia or problems with walking or coordination,  change in mood/affect or memory.          Objective:   Physical Exam  Pleasant amb relatively thin bm nad  Wt 172 04/10/2011 > 03/21/2012 184 >  07/24/2012  183 >  04/16/2013 185 > 08/18/2013  182  HEENT mild turbinate edema.  Oropharynx no thrush or excess pnd or cobblestoning.  No JVD or cervical adenopathy. Mild accessory muscle hypertrophy. Trachea midline, nl thryroid. Chest was hyperinflated by percussion with diminished breath sounds and moderate increased exp time with trace bilateral  late exp wheeze. Hoover sign positive at mid inspiration. Regular rate and rhythm without murmur gallop or rub or increase P2 or edema.  Abd: no hsm, nl excursion. Ext warm without cyanosis or clubbing.        cxr 11/17/11 1. No acute cardiopulmonary disease.  2. Emphysema.  Assessment:

## 2013-08-20 NOTE — Assessment & Plan Note (Signed)
-   HFA 90% 05/02/2012      - Short term disability from 7/1 - 04/26/11      - PFT's 04/25/2011 FEV1  2.66 (72%) ratio 56 and 13% better p B2 with DLC0 87%     - PFT's 05/02/2012 FEV1  1.53 (42%)  ratio 47 and improved to 2.05 p B2 (34%) DLCO 67  I had an extended discussion with the patient today lasting 15 to 20 minutes of a 25 minute visit on the following issues:  He does fine as long as he has access to his meds and states he has new insurance through his wife but doesn't know the drug formulary so I provided him with samples/ rx for both dulera200 and symbicort 160 2bid and offered to see him back immediately in future if he either runs out of meds and can't acquire them or certainly for flares on rx (which he admits has not occurred since starting rx, only when he runs out)  Also reviewed rule of two's.  Pulmonary f/u is prn as his copay under the new plan to see me is 60$ and he can't afford this.

## 2013-11-04 ENCOUNTER — Telehealth: Payer: Self-pay | Admitting: Internal Medicine

## 2013-11-04 MED ORDER — MOMETASONE FURO-FORMOTEROL FUM 200-5 MCG/ACT IN AERO: INHALATION_SPRAY | RESPIRATORY_TRACT | Status: DC

## 2013-11-04 MED ORDER — BUDESONIDE-FORMOTEROL FUMARATE 160-4.5 MCG/ACT IN AERO: INHALATION_SPRAY | RESPIRATORY_TRACT | Status: DC

## 2013-11-04 NOTE — Telephone Encounter (Signed)
Spoke with pt. Aware 1 sample of dulera and symbicort left for pick up. We did not have any rescue inhaler. Nothing further needed

## 2014-01-15 DIAGNOSIS — F17200 Nicotine dependence, unspecified, uncomplicated: Secondary | ICD-10-CM | POA: Diagnosis present

## 2014-01-15 DIAGNOSIS — J45909 Unspecified asthma, uncomplicated: Secondary | ICD-10-CM | POA: Diagnosis present

## 2014-04-06 ENCOUNTER — Telehealth: Payer: Self-pay | Admitting: Internal Medicine

## 2014-04-06 NOTE — Telephone Encounter (Signed)
Per 11.17.14 ov w/ SN: Patient Instructions     Either use dulera 200 or symbicort 160 Take 2 puffs first thing in am and then another 2 puffs about 12 hours later.  Only use your albuterol (proaire) as a rescue medication to be used if you can't catch your breath by resting or doing a relaxed purse lip breathing pattern.  - The less you use it, the better it will work when you need it.  - Ok to use up to every 4 hours if you must but call for immediate appointment if use goes up over your usual need  - Don't leave home without it !! (think of it like your spare tire for your car)  If you are satisfied with your treatment plan let your doctor know and he/she can either refill your medications or you can return here when your prescription runs out.  If in any way you are not 100% satisfied, please tell us. If 100% better, tell your friends!    LMOM TCB x1

## 2014-04-06 NOTE — Telephone Encounter (Signed)
Pt calling back again.

## 2014-04-07 MED ORDER — MOMETASONE FURO-FORMOTEROL FUM 200-5 MCG/ACT IN AERO: INHALATION_SPRAY | RESPIRATORY_TRACT | Status: DC

## 2014-04-07 MED ORDER — BUDESONIDE-FORMOTEROL FUMARATE 160-4.5 MCG/ACT IN AERO: INHALATION_SPRAY | RESPIRATORY_TRACT | Status: DC

## 2014-04-07 NOTE — Telephone Encounter (Signed)
Pt returned call.  Holly D Pryor ° °

## 2014-04-07 NOTE — Telephone Encounter (Signed)
Pt aware samples left for pick up. Nothing further needed 

## 2014-07-06 ENCOUNTER — Telehealth: Payer: Self-pay | Admitting: Internal Medicine

## 2014-07-06 NOTE — Telephone Encounter (Signed)
Gave pt sample of dulera 200 and advised pt we do not have an rescue inhalers. Pt denied needing rx. Nothing further needed.

## 2014-09-07 ENCOUNTER — Telehealth: Payer: Self-pay | Admitting: Internal Medicine

## 2014-09-07 MED ORDER — BUDESONIDE-FORMOTEROL FUMARATE 160-4.5 MCG/ACT IN AERO: INHALATION_SPRAY | RESPIRATORY_TRACT | Status: DC

## 2014-09-07 MED ORDER — MOMETASONE FURO-FORMOTEROL FUM 200-5 MCG/ACT IN AERO: INHALATION_SPRAY | RESPIRATORY_TRACT | Status: DC

## 2014-09-07 NOTE — Telephone Encounter (Signed)
Pt given samples of Dulera 200 and Symbicort 160 (1 each) Appt made with MW 09/15/14 - 10am Nothing further needed.

## 2014-09-15 ENCOUNTER — Ambulatory Visit: Payer: Commercial Managed Care - PPO | Admitting: Internal Medicine

## 2014-11-17 ENCOUNTER — Telehealth: Payer: Self-pay | Admitting: Internal Medicine

## 2014-11-17 ENCOUNTER — Ambulatory Visit (INDEPENDENT_AMBULATORY_CARE_PROVIDER_SITE_OTHER): Payer: Commercial Managed Care - PPO | Admitting: Internal Medicine

## 2014-11-17 ENCOUNTER — Encounter: Payer: Self-pay | Admitting: Internal Medicine

## 2014-11-17 VITALS — BP 120/86 | HR 100 | Ht 72.0 in | Wt 160.0 lb

## 2014-11-17 DIAGNOSIS — F17210 Nicotine dependence, cigarettes, uncomplicated: Secondary | ICD-10-CM | POA: Insufficient documentation

## 2014-11-17 DIAGNOSIS — J449 Chronic obstructive pulmonary disease, unspecified: Secondary | ICD-10-CM

## 2014-11-17 DIAGNOSIS — J432 Centrilobular emphysema: Secondary | ICD-10-CM

## 2014-11-17 DIAGNOSIS — Z720 Tobacco use: Secondary | ICD-10-CM

## 2014-11-17 MED ORDER — BUDESONIDE-FORMOTEROL FUMARATE 160-4.5 MCG/ACT IN AERO: INHALATION_SPRAY | RESPIRATORY_TRACT | Status: DC

## 2014-11-17 NOTE — Telephone Encounter (Signed)
Spoke with pt in lobby.  Pt having increased diff breathing.  Has been out of Symbicort for 4-5 days.  Worked in with Dr Sherene SiresWert today.

## 2014-11-17 NOTE — Assessment & Plan Note (Signed)

## 2014-11-17 NOTE — Progress Notes (Signed)
Subjective:     Patient ID: Colton Ford, male   DOB: Feb 06, 1961    MRN: [id number]    Brief patient profile:  9   yobm active smoker  Presented 04/02/11 with severe resp distress > [place] >  ICU on ventilator with dx asthma exac and proved to have GOLD III with reversibility  by pfts 05/02/2012 with tendency to flare in symptoms when runs out of meds   History of Present Illness  04/10/2011 Initial pulmonary office eval usually followed by [name] with h/o childhood asthma outgrew it around the 3rd grade with one episode of ER trip 2008  And since then required intermittent albuterol sometimes not for several months at a time but since 03/20/2011 needed it daily.  Addmit 7/1-12/2010 for status asthmaticus requiring vent overnight and extubated and discharged on advair on 7/3. Works at [employer] where dusty and hot but last worked there 6/29 and has not returned. Still sob variably present sitting still and some worse walking across the parking lot. No purulent sputum.   rec No change rx, reviewed difference between saba and maint rx     08/18/2013 f/u ov/Wert re: asthma flare/ again out of dulera Chief Complaint  Patient presents with  . Acute Visit    Pt c/o SOB constantly, congestion, prod cough with green mucous   using saba up to 4 x daily to control chest tightness and wheezing  rec Either use dulera 200 or symbicort 160 Take 2 puffs first thing in am and then another 2 puffs about 12 hours later.  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 Acute Visit    Pt c/o increased SOB for the past 5 days. He ran out of Symbicort 6 days ago. He c/o chest tightness and states he is out of breath with any exertion.       No obvious daytime variabilty or assoc chronic cough  or   overt sinus or hb symptoms. No unusual exp hx or h/o childhood pna/ asthma or premature birth to  his knowledge.     Also denies any obvious fluctuation of symptoms with weather or environmental changes or other aggravating or alleviating factors except as outlined above  ROS  The following are not active complaints unless bolded sore throat, dysphagia, dental problems, itching, sneezing,  nasal congestion or excess/ purulent secretions, ear ache,   fever, chills, sweats, unintended wt loss, pleuritic or exertional cp, hemoptysis,  orthopnea pnd or leg swelling, presyncope, palpitations, heartburn, abdominal pain, anorexia, nausea, vomiting, diarrhea  or change in bowel or urinary habits, change in stools or urine, dysuria,hematuria,  rash, arthralgias, visual complaints, headache, numbness weakness or ataxia or problems with walking or coordination,  change in mood/affect or memory.          Objective:   Physical Exam  Pleasant amb relatively thin bm nad  Wt 172 04/10/2011 > 03/21/2012 184 > 07/24/2012  183 >  04/16/2013 185 > 08/18/2013  182 > 11/17/2014  160  HEENT mild turbinate edema.  Oropharynx no thrush or excess pnd or cobblestoning.  No JVD or cervical adenopathy. Mild accessory muscle hypertrophy. Trachea midline, nl thryroid. Chest was hyperinflated by percussion with diminished breath sounds and moderate increased exp time with trace bilateral mid exp wheeze. Hoover sign positive at mid inspiration. Regular rate and rhythm without murmur gallop or rub or increase P2  or edema.  Abd: no hsm, nl excursion. Ext warm without cyanosis or clubbing.        no recent cxr on file   Assessment:

## 2014-11-17 NOTE — Assessment & Plan Note (Signed)
-   PFT's 04/25/2011 FEV1  2.66 (72%) ratio 56 and 13% better p B2 with DLC0 87%     - PFT's 05/02/2012 FEV1    2.05 p B2 (34% better) and ratio still 51 and  DLCO 67 corrects to 84% for alv vol      - 11/17/2014 p extensive coaching HFA effectiveness =    75%   DDX of  difficult airways management all start with A and  include Adherence, Ace Inhibitors, Acid Reflux, Active Sinus Disease, Alpha 1 Antitripsin deficiency, Anxiety masquerading as Airways dz,  ABPA,  allergy(esp in young), Aspiration (esp in elderly), Adverse effects of DPI,  Active smokers, plus two Bs  = Bronchiectasis and Beta blocker use..and one C= CHF  Adherence is always the initial "prime suspect" and is a multilayered concern that requires a "trust but verify" approach in every patient - starting with knowing how to use medications, especially inhalers, correctly, keeping up with refills and understanding the fundamental difference between maintenance and prns vs those medications only taken for a very short course and then stopped and not refilled.    Advised will need to see us monthly if can't afford maint rx otherwise can't give him samples to last indefinitely esp as he is actively smoking and the samples we have are limited in number

## 2014-11-17 NOTE — Patient Instructions (Signed)
The key is to stop smoking completely before smoking completely stops you - this is the most important aspect of your care  symbicort 160 Take 2 puffs first thing in am and then another 2 puffs about 12 hours later.   Work on inhaler technique:  relax and gently blow all the way out then take a nice smooth deep breath back in, triggering the inhaler at same time you start breathing in.  Hold for up to 5 seconds if you can.  Rinse and gargle with water when done   Please schedule a follow up office visit in 6 months, call sooner if needed

## 2015-01-08 ENCOUNTER — Telehealth: Payer: Self-pay | Admitting: Internal Medicine

## 2015-01-08 NOTE — Telephone Encounter (Signed)
We do not have samples at this time. Pt is aware. Nothing further was needed. 

## 2015-01-08 NOTE — Telephone Encounter (Signed)
Pt returned call - [phone]

## 2015-01-08 NOTE — Telephone Encounter (Signed)
lmomtcb x1 for pt 

## 2015-01-08 NOTE — Telephone Encounter (Signed)
lmtcb for pt.  

## 2015-01-11 NOTE — Telephone Encounter (Signed)
lmtcb x3 

## 2015-01-12 NOTE — Telephone Encounter (Signed)
lmtcb for pt.  

## 2015-01-29 ENCOUNTER — Telehealth: Payer: Self-pay | Admitting: Internal Medicine

## 2015-01-29 NOTE — Telephone Encounter (Signed)
Sample of Symbicort given to pt along with pt assistance forms. Advised pt to complete the form and bring back here and we will complete the rest. Pt verbalized understanding and denied any further questions or concerns at this time.

## 2015-03-23 ENCOUNTER — Telehealth: Payer: Self-pay | Admitting: Internal Medicine

## 2015-03-23 DIAGNOSIS — J432 Centrilobular emphysema: Secondary | ICD-10-CM

## 2015-03-23 MED ORDER — BUDESONIDE-FORMOTEROL FUMARATE 160-4.5 MCG/ACT IN AERO: INHALATION_SPRAY | RESPIRATORY_TRACT | Status: DC

## 2015-03-23 NOTE — Telephone Encounter (Signed)
Pt given symbicort samples as we do not have rescue inhaler samples, and dulera is not on his med list.  Nothing further needed.

## 2015-03-24 MED ORDER — BUDESONIDE-FORMOTEROL FUMARATE 160-4.5 MCG/ACT IN AERO: INHALATION_SPRAY | RESPIRATORY_TRACT | Status: DC

## 2015-03-24 NOTE — Telephone Encounter (Signed)
Form is in Dr Thurston Hole lookat to be singed

## 2015-03-24 NOTE — Telephone Encounter (Signed)
Pt assistance forms were faxed to [employer]  Pt aware  Nothing further needed

## 2015-03-24 NOTE — Telephone Encounter (Signed)
[name] - did you get these papers?  Ok to close encounter?  Please advise.

## 2015-03-25 ENCOUNTER — Encounter (HOSPITAL_COMMUNITY): Payer: Self-pay | Admitting: *Deleted

## 2015-03-25 ENCOUNTER — Emergency Department (HOSPITAL_COMMUNITY): Payer: No Typology Code available for payment source

## 2015-03-25 ENCOUNTER — Emergency Department (HOSPITAL_COMMUNITY)
Admission: EM | Admit: 2015-03-25 | Discharge: 2015-03-25 | Disposition: A | Discharge status: Home / Self Care | Payer: No Typology Code available for payment source | Attending: Emergency Medicine | Admitting: Emergency Medicine

## 2015-03-25 DIAGNOSIS — Z862 Personal history of diseases of the blood and blood-forming organs and certain disorders involving the immune mechanism: Secondary | ICD-10-CM | POA: Insufficient documentation

## 2015-03-25 DIAGNOSIS — Z720 Tobacco use: Secondary | ICD-10-CM | POA: Insufficient documentation

## 2015-03-25 DIAGNOSIS — S199XXA Unspecified injury of neck, initial encounter: Secondary | ICD-10-CM | POA: Diagnosis not present

## 2015-03-25 DIAGNOSIS — S4991XA Unspecified injury of right shoulder and upper arm, initial encounter: Secondary | ICD-10-CM | POA: Diagnosis not present

## 2015-03-25 DIAGNOSIS — J45909 Unspecified asthma, uncomplicated: Secondary | ICD-10-CM | POA: Diagnosis not present

## 2015-03-25 DIAGNOSIS — S99921A Unspecified injury of right foot, initial encounter: Secondary | ICD-10-CM | POA: Insufficient documentation

## 2015-03-25 DIAGNOSIS — S29092A Other injury of muscle and tendon of back wall of thorax, initial encounter: Secondary | ICD-10-CM | POA: Diagnosis not present

## 2015-03-25 DIAGNOSIS — Y998 Other external cause status: Secondary | ICD-10-CM | POA: Insufficient documentation

## 2015-03-25 DIAGNOSIS — Y9389 Activity, other specified: Secondary | ICD-10-CM | POA: Diagnosis not present

## 2015-03-25 DIAGNOSIS — Y92410 Unspecified street and highway as the place of occurrence of the external cause: Secondary | ICD-10-CM | POA: Insufficient documentation

## 2015-03-25 DIAGNOSIS — Z79899 Other long term (current) drug therapy: Secondary | ICD-10-CM | POA: Insufficient documentation

## 2015-03-25 MED ORDER — METHOCARBAMOL 500 MG PO TABS: 500.0000 mg | ORAL_TABLET | Freq: Four times a day (QID) | ORAL | Status: DC | PRN

## 2015-03-25 MED ORDER — IBUPROFEN 400 MG PO TABS
600.0000 mg | ORAL_TABLET | Freq: Once | ORAL | Status: AC
  Administered 2015-03-25: 600 mg via ORAL
  Filled 2015-03-25: qty 2

## 2015-03-25 MED ORDER — IBUPROFEN 800 MG PO TABS: 800.0000 mg | ORAL_TABLET | Freq: Three times a day (TID) | ORAL | Status: DC | PRN
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
## 2015-03-25 NOTE — ED Notes (Signed)
Applied c-collar at triage.

## 2015-03-25 NOTE — ED Notes (Signed)
Declined W/C at D/C and was escorted to lobby by RN. 
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
## 2015-03-31 ENCOUNTER — Encounter (HOSPITAL_COMMUNITY): Payer: Self-pay | Admitting: *Deleted

## 2015-03-31 ENCOUNTER — Emergency Department (HOSPITAL_COMMUNITY)
Admission: EM | Admit: 2015-03-31 | Discharge: 2015-03-31 | Disposition: A | Discharge status: Home / Self Care | Payer: No Typology Code available for payment source | Attending: Emergency Medicine | Admitting: Emergency Medicine

## 2015-03-31 DIAGNOSIS — M546 Pain in thoracic spine: Secondary | ICD-10-CM | POA: Diagnosis present

## 2015-03-31 DIAGNOSIS — Z862 Personal history of diseases of the blood and blood-forming organs and certain disorders involving the immune mechanism: Secondary | ICD-10-CM | POA: Insufficient documentation

## 2015-03-31 DIAGNOSIS — J45909 Unspecified asthma, uncomplicated: Secondary | ICD-10-CM | POA: Insufficient documentation

## 2015-03-31 DIAGNOSIS — G8911 Acute pain due to trauma: Secondary | ICD-10-CM | POA: Diagnosis not present

## 2015-03-31 DIAGNOSIS — Z7951 Long term (current) use of inhaled steroids: Secondary | ICD-10-CM | POA: Diagnosis not present

## 2015-03-31 DIAGNOSIS — Z720 Tobacco use: Secondary | ICD-10-CM | POA: Diagnosis not present

## 2015-03-31 DIAGNOSIS — M549 Dorsalgia, unspecified: Secondary | ICD-10-CM

## 2015-03-31 NOTE — ED Notes (Signed)
Pt reports on going back pain since 03-25-15

## 2015-03-31 NOTE — Discharge Instructions (Signed)
Take the medications prescribed at your last visit.  Muscle Strain A muscle strain is an injury that occurs when a muscle is stretched beyond its normal length. Usually a small number of muscle fibers are torn when this happens. Muscle strain is rated in degrees. First-degree strains have the least amount of muscle fiber tearing and pain. Second-degree and third-degree strains have increasingly more tearing and pain.  Usually, recovery from muscle strain takes 1-2 weeks. Complete healing takes 5-6 weeks.  CAUSES  Muscle strain happens when a sudden, violent force placed on a muscle stretches it too far. This may occur with lifting, sports, or a fall.  RISK FACTORS Muscle strain is especially common in athletes.  SIGNS AND SYMPTOMS At the site of the muscle strain, there may be:  Pain.  Bruising.  Swelling.  Difficulty using the muscle due to pain or lack of normal function. DIAGNOSIS  Your health care provider will perform a physical exam and ask about your medical history. TREATMENT  Often, the best treatment for a muscle strain is resting, icing, and applying cold compresses to the injured area.  HOME CARE INSTRUCTIONS   Use the PRICE method of treatment to promote muscle healing during the first 2-3 days after your injury. The PRICE method involves:  Protecting the muscle from being injured again.  Restricting your activity and resting the injured body part.  Icing your injury. To do this, put ice in a plastic bag. Place a towel between your skin and the bag. Then, apply the ice and leave it on from 15-20 minutes each hour. After the third day, switch to moist heat packs.  Apply compression to the injured area with a splint or elastic bandage. Be careful not to wrap it too tightly. This may interfere with blood circulation or increase swelling.  Elevate the injured body part above the level of your heart as often as you can.  Only take over-the-counter or prescription  medicines for pain, discomfort, or fever as directed by your health care provider.  Warming up prior to exercise helps to prevent future muscle strains. SEEK MEDICAL CARE IF:   You have increasing pain or swelling in the injured area.  You have numbness, tingling, or a significant loss of strength in the injured area. MAKE SURE YOU:   Understand these instructions.  Will watch your condition.  Will get help right away if you are not doing well or get worse. Document Released: 09/18/2005 Document Revised: 07/09/2013 Document Reviewed: 04/17/2013 [facility] Patient Information 2015 [place]. This information is not intended to replace advice given to you by your health care provider. Make sure you discuss any questions you have with your health care provider.

## 2015-03-31 NOTE — ED Notes (Signed)
Declined W/C at D/C and was escorted to lobby by RN. 
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
## 2016-01-13 ENCOUNTER — Telehealth: Payer: Self-pay | Admitting: Internal Medicine

## 2016-01-13 NOTE — Telephone Encounter (Signed)
LMTCB x 1 

## 2016-01-17 NOTE — Telephone Encounter (Signed)
LM x 2

## 2016-01-17 NOTE — Telephone Encounter (Signed)
1 sample up front for pick up  I have scheduled him for appt with MW for 03/06/16 at 3:30  He is aware to keep this appt for additional refills/samples

## 2016-01-17 NOTE — Telephone Encounter (Signed)
Patient called back regarding samples, pt can be reached at [phone]

## 2016-03-06 ENCOUNTER — Ambulatory Visit: Payer: Self-pay | Admitting: Internal Medicine

## 2016-04-19 ENCOUNTER — Telehealth: Payer: Self-pay | Admitting: Internal Medicine

## 2016-04-19 NOTE — Telephone Encounter (Signed)
Patient requesting patient assistance paperwork to complete for his Symbicort.  Mailed papers to patient. Patient aware and will mail back. Sending to [place] for follow up when papers return.

## 2016-04-28 NOTE — Telephone Encounter (Signed)
Colton Ford have these forms been returned to you?

## 2016-05-02 NOTE — Telephone Encounter (Signed)
lmomtcb  

## 2016-05-03 NOTE — Telephone Encounter (Signed)
Pt returned call He stated that he has not returned the patient assistance forms yet because he is waiting for a copy of his 2016 W-2 from his employer.  Recommended pt to bring the forms to the office once he has completed his portion.  Pt voiced his understanding.  Nothing further needed; will sign off.

## 2016-05-03 NOTE — Telephone Encounter (Signed)
Patient returned call, states he is at work and will try to call back at about 3:00 pm today.

## 2016-05-03 NOTE — Telephone Encounter (Signed)
lmtcb X1 Will await call back. 

## 2016-05-03 NOTE — Telephone Encounter (Signed)
lmtcb x2 for pt. 

## 2016-05-12 ENCOUNTER — Telehealth: Payer: Self-pay | Admitting: Internal Medicine

## 2016-05-12 DIAGNOSIS — J432 Centrilobular emphysema: Secondary | ICD-10-CM

## 2016-05-12 MED ORDER — BUDESONIDE-FORMOTEROL FUMARATE 160-4.5 MCG/ACT IN AERO: INHALATION_SPRAY | RESPIRATORY_TRACT | 3 refills | Status: DC

## 2016-05-12 NOTE — Telephone Encounter (Signed)
Forms filled out, rx printed and placed in Dr. Thurston HoleWert's lookat to be completed

## 2016-05-16 NOTE — Telephone Encounter (Signed)
MW were these forms completed?

## 2016-05-16 NOTE — Telephone Encounter (Signed)
I don't recall seeing them.

## 2016-05-16 NOTE — Telephone Encounter (Signed)
Pt assistance forms have been signed by MW and faxed back.  Will forward back to leslie to follow up on.

## 2016-08-08 ENCOUNTER — Telehealth: Payer: Self-pay | Admitting: Internal Medicine

## 2016-08-08 NOTE — Telephone Encounter (Signed)
LMTCB

## 2016-08-09 NOTE — Telephone Encounter (Signed)
LMTCB

## 2016-08-09 NOTE — Telephone Encounter (Signed)
Spoke with pt, checking on status of patient assistance forms. I advised pt that he has not been seen sine 11/2014 and will need to be scheduled for an office visit before we can continue refilling rx's for him.  Pt states he is about to get health insurance and did not wish to schedule an appt with me at this time.    Pt states that his patient assistance forms were not fully completed when they were faxed to [employer]. Verlon AuLeslie please advise if you still have these forms.  Thanks.

## 2016-08-09 NOTE — Telephone Encounter (Signed)
Pt returning call.Colton Ford ° °

## 2016-08-09 NOTE — Telephone Encounter (Signed)
Pt returned phone call...contact # [phone] pt states he can receive phone calls around 10:45am.Erica R Ladona Ridgelaylor

## 2016-08-09 NOTE — Telephone Encounter (Signed)
I do not have any forms on this pt The last forms were scanned in in July 2017

## 2016-08-09 NOTE — Telephone Encounter (Signed)
lmomtcb x 2  

## 2016-08-10 NOTE — Telephone Encounter (Signed)
Patient called back. Forms that were scanned from July were the only forms patient had. He has called [place] yesterday and per the patient they told him all the information that was needed has been received and his order has been processed. He does not need anything further. If needed he can be reached at [phone] -pr

## 2017-04-10 ENCOUNTER — Ambulatory Visit (INDEPENDENT_AMBULATORY_CARE_PROVIDER_SITE_OTHER): Payer: Self-pay | Admitting: Internal Medicine

## 2017-04-10 ENCOUNTER — Encounter: Payer: Self-pay | Admitting: Internal Medicine

## 2017-04-10 VITALS — BP 138/98 | HR 98 | Ht 72.0 in | Wt 170.0 lb

## 2017-04-10 DIAGNOSIS — J449 Chronic obstructive pulmonary disease, unspecified: Secondary | ICD-10-CM

## 2017-04-10 DIAGNOSIS — I10 Essential (primary) hypertension: Secondary | ICD-10-CM

## 2017-04-10 MED ORDER — BUDESONIDE-FORMOTEROL FUMARATE 160-4.5 MCG/ACT IN AERO: 2.0000 | INHALATION_SPRAY | Freq: Two times a day (BID) | RESPIRATORY_TRACT | 11 refills | Status: DC

## 2017-04-10 MED ORDER — ALBUTEROL SULFATE HFA 108 (90 BASE) MCG/ACT IN AERS: 2.0000 | INHALATION_SPRAY | Freq: Four times a day (QID) | RESPIRATORY_TRACT | 1 refills | Status: DC | PRN

## 2017-04-10 MED ORDER — BUDESONIDE-FORMOTEROL FUMARATE 160-4.5 MCG/ACT IN AERO: 2.0000 | INHALATION_SPRAY | Freq: Two times a day (BID) | RESPIRATORY_TRACT | 0 refills | Status: DC

## 2017-04-10 NOTE — Progress Notes (Signed)
Subjective:     Patient ID: Colton Ford, male   DOB: 30-Mar-1961    MRN: [id number]    Brief patient profile:  [age] yobm active smoker  Presented 04/02/11 with severe resp distress > [place] >  ICU on ventilator with dx asthma exac and proved to have GOLD III with reversibility  by pfts 05/02/2012 with tendency to flare in symptoms when runs out of meds   History of Present Illness  04/10/2011 Initial pulmonary office eval usually followed by Colton Ford with h/o childhood asthma outgrew it around the 3rd grade with one episode of ER trip 2008  And since then required intermittent albuterol sometimes not for several months at a time but since 03/20/2011 needed it daily.  Addmit 7/1-12/2010 for status asthmaticus requiring vent overnight and extubated and discharged on advair on 7/3. [place] [employer] [place] dusty and hot but last worked there 6/29 and has not returned. Still sob variably present sitting still and some worse walking across the parking lot. No purulent sputum.   rec No change rx, reviewed difference between saba and maint rx     08/18/2013 f/u ov/Colton Ford re: asthma flare/ again out of dulera Chief Complaint  Patient presents with  . Acute Visit    Pt c/o SOB constantly, congestion, prod cough with green mucous   using saba up to 4 x daily to control chest tightness and wheezing  rec Either use dulera 200 or symbicort 160 Take 2 puffs first thing in am and then another 2 puffs about 12 hours later.  Only use your albuterol (proaire) prn    11/17/2014 f/u ov/Colton Ford re: copd gold III with reversibility/ still smoking Chief Complaint  Patient presents with  . Acute Visit    Pt c/o increased SOB for the past 5 days. He ran out of Symbicort 6 days ago. He c/o chest tightness and states he is out of breath with any exertion.   rec The key is to stop smoking completely before smoking completely stops you - this is the most important aspect of your care Symbicort 160 Take 2 puffs first  thing in am and then another 2 puffs about 12 hours later.  Work on inhaler technique:     04/10/2017  f/u ov/Colton Ford re:  GOLD III and still smoking  Chief Complaint  Patient presents with  . 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 No unusual exp hx or h/o childhood pna/ asthma or knowledge of premature birth.  Sleeping ok without nocturnal  or early am exacerbation  of respiratory  c/o's or need for noct saba. Also denies any obvious fluctuation of symptoms with weather or environmental changes or other aggravating or alleviating factors except as outlined above   Current Medications, Allergies, Complete Past Medical History, Past Surgical History, Family History, and Social History were reviewed [facility] electronic [facility].  ROS  The following are not active complaints unless bolded sore throat, dysphagia, dental problems, itching,  sneezing,  nasal congestion or excess/ purulent secretions, ear ache,   fever, chills, sweats, unintended wt loss, classically pleuritic or exertional cp,  orthopnea pnd or leg swelling, presyncope, palpitations, abdominal pain, anorexia, nausea, vomiting, diarrhea  or change in bowel or bladder habits, change in stools or urine, dysuria,hematuria,  rash, arthralgias, visual complaints, headache, numbness, weakness or ataxia or problems with walking or coordination,  change in mood/affect or memory.                    Objective:   Physical Exam  Pleasant amb relatively thin  bm nad  Wt 172 04/10/2011 > 03/21/2012 184 > 07/24/2012  183 >  04/16/2013 185 > 08/18/2013  182 > 11/17/2014  160 > 04/10/2017  170   HEENT: nl dentition, turbinates bilaterally, and oropharynx. Nl external ear canals without cough reflex   NECK :  without JVD/Nodes/TM/ nl carotid upstrokes bilaterally   LUNGS: no acc muscle use,  Nl contour chest with a scattered mild insp and exp rhonchi bilaterally    CV:  RRR  no s3 or murmur or increase in P2, and no edema   ABD:  soft and nontender with nl inspiratory excursion in the supine position. No bruits or organomegaly appreciated, bowel sounds nl  MS:  Nl gait/ ext warm without deformities, calf tenderness, cyanosis or clubbing No obvious joint restrictions   SKIN: warm and dry without lesions    NEURO:  alert, approp, nl sensorium with  no motor or cerebellar deficits apparent.            Assessment:

## 2017-04-10 NOTE — Patient Instructions (Addendum)
Plan A = Automatic = symbicort 160 Take 2 puffs first thing in am and then another 2 puffs about 12 hours later.   Work on inhaler technique:  relax and gently blow all the way out then take a nice smooth deep breath back in, triggering the inhaler at same time you start breathing in.  Hold for up to 5 seconds if you can. Blow out thru nose. Rinse and gargle with water when done      Plan B = Backup Only use your albuterol as a rescue medication to be used if you can't catch your breath by resting or doing a relaxed purse lip breathing pattern.  - The less you use it, the better it will work when you need it. - Ok to use the inhaler up to 2 puffs  every 4 hours if you must but call for appointment if use goes up over your usual need - Don't leave home without it !!  (think of it like the spare tire for your car)    The key is to stop smoking completely before smoking completely stops you!   You need to follow with Primary Doctor re your blood pressure     If you are satisfied with your treatment plan,  let your doctor know and he/she can either refill your medications or you can return here when your prescription runs out.     If in any way you are not 100% satisfied,  please tell us.  If 100% better, tell your friends!  Pulmonary follow up is as needed

## 2017-04-10 NOTE — Assessment & Plan Note (Addendum)
-   PFT's 04/25/2011 FEV1  2.66 (72%) ratio 56 and 13% better p B2 with DLC0 87%     - PFT's 05/02/2012 FEV1    2.05 p B2 (34% better) and ratio still 51 and  DLCO 67 corrects to 84% for alv vol        - 04/10/2017 p extensive coaching HFA effectiveness =    90% from a baseline of 75%   Formulary restrictions will be an ongoing challenge for the forseable future and I would be happy to pick an alternative if the pt will first  provide me a list of them but pt  will need to return here for training for any new device that is required eg dpi vs hfa vs respimat.    In meantime we can always provide samples so the patient never runs out of any needed respiratory medications.   No change in rx needed for now = symb 160 2bid and prn saba > f/u can be per PCP  Each maintenance medication was reviewed in detail including most importantly the difference between maintenance and as needed and under what circumstances the prns are to be used.  Please see AVS for specific  Instructions which are unique to this visit and I personally typed out  which were reviewed in detail in writing with the patient and a copy provided.

## 2017-04-12 DIAGNOSIS — I10 Essential (primary) hypertension: Secondary | ICD-10-CM | POA: Insufficient documentation

## 2017-04-12 NOTE — Assessment & Plan Note (Signed)
Follow up per Primary Care planned   

## 2017-05-04 ENCOUNTER — Other Ambulatory Visit: Payer: Self-pay | Admitting: Internal Medicine

## 2017-05-04 MED ORDER — BUDESONIDE-FORMOTEROL FUMARATE 160-4.5 MCG/ACT IN AERO: 2.0000 | INHALATION_SPRAY | Freq: Two times a day (BID) | RESPIRATORY_TRACT | 3 refills | Status: DC

## 2017-05-16 ENCOUNTER — Telehealth: Payer: Self-pay | Admitting: Internal Medicine

## 2017-05-16 NOTE — Telephone Encounter (Signed)
I looked in MW's folders up front and Leslie's cubby, I did not see any applications for the patient. Attempted to call [employer] but was told it would take 14 mins before I could speak with someone.   Will need to call [employer] back to see if the forms were sent.

## 2017-05-17 NOTE — Telephone Encounter (Signed)
Scanned forms are in Media tab. These were faxed on 05/07/2017. Called and spoke to pt's wife and informed her these may take up to 2-3 weeks to be processed. Gave her the number for [employer] that is located on the application so she could call for updates. Advised her to give this process another week or 2 and then call program for an update. Pt's wife verbalized understanding and denied any further questions or concerns at this time.

## 2017-05-24 ENCOUNTER — Telehealth: Payer: Self-pay | Admitting: Internal Medicine

## 2017-05-24 NOTE — Telephone Encounter (Signed)
Left message for patient to call back  

## 2017-05-25 NOTE — Telephone Encounter (Signed)
Samples have been left up front for pick up. Pt is aware. Nothing further was needed. 

## 2017-05-25 NOTE — Telephone Encounter (Signed)
Patient is returning phone call.  °

## 2017-06-06 ENCOUNTER — Telehealth: Payer: Self-pay | Admitting: Internal Medicine

## 2017-06-06 NOTE — Telephone Encounter (Signed)
Noted by triage, thank you Routing to PeeblesLeslie for follow up

## 2017-06-06 NOTE — Telephone Encounter (Signed)
I have already faxed the form

## 2018-10-16 ENCOUNTER — Other Ambulatory Visit: Payer: Self-pay

## 2018-10-16 ENCOUNTER — Emergency Department (HOSPITAL_BASED_OUTPATIENT_CLINIC_OR_DEPARTMENT_OTHER)
Admission: EM | Admit: 2018-10-16 | Discharge: 2018-10-16 | Disposition: A | Discharge status: Home / Self Care | Payer: Self-pay | Attending: Emergency Medicine | Admitting: Emergency Medicine

## 2018-10-16 ENCOUNTER — Emergency Department (HOSPITAL_BASED_OUTPATIENT_CLINIC_OR_DEPARTMENT_OTHER): Payer: Self-pay

## 2018-10-16 ENCOUNTER — Encounter (HOSPITAL_BASED_OUTPATIENT_CLINIC_OR_DEPARTMENT_OTHER): Payer: Self-pay

## 2018-10-16 DIAGNOSIS — J4541 Moderate persistent asthma with (acute) exacerbation: Secondary | ICD-10-CM | POA: Insufficient documentation

## 2018-10-16 DIAGNOSIS — J449 Chronic obstructive pulmonary disease, unspecified: Secondary | ICD-10-CM | POA: Insufficient documentation

## 2018-10-16 DIAGNOSIS — Z79899 Other long term (current) drug therapy: Secondary | ICD-10-CM | POA: Insufficient documentation

## 2018-10-16 DIAGNOSIS — F17210 Nicotine dependence, cigarettes, uncomplicated: Secondary | ICD-10-CM | POA: Insufficient documentation

## 2018-10-16 MED ORDER — LIDOCAINE VISCOUS HCL 2 % MT SOLN
15.0000 mL | Freq: Once | OROMUCOSAL | Status: AC
  Administered 2018-10-16: 15 mL via ORAL
  Filled 2018-10-16: qty 15

## 2018-10-16 MED ORDER — ALBUTEROL SULFATE HFA 108 (90 BASE) MCG/ACT IN AERS: 2.0000 | INHALATION_SPRAY | Freq: Four times a day (QID) | RESPIRATORY_TRACT | 6 refills | Status: DC | PRN

## 2018-10-16 MED ORDER — ALBUTEROL SULFATE (2.5 MG/3ML) 0.083% IN NEBU
INHALATION_SOLUTION | RESPIRATORY_TRACT | Status: AC
  Administered 2018-10-16: 2.5 mg
  Filled 2018-10-16: qty 3

## 2018-10-16 MED ORDER — IPRATROPIUM-ALBUTEROL 0.5-2.5 (3) MG/3ML IN SOLN
RESPIRATORY_TRACT | Status: AC
  Administered 2018-10-16: 3 mL
  Filled 2018-10-16: qty 3

## 2018-10-16 MED ORDER — PREDNISONE 10 MG PO TABS
60.0000 mg | ORAL_TABLET | Freq: Once | ORAL | Status: AC
  Administered 2018-10-16: 60 mg via ORAL
  Filled 2018-10-16: qty 1

## 2018-10-16 MED ORDER — AEROCHAMBER PLUS FLO-VU MEDIUM MISC
1.0000 | Freq: Once | Status: AC
  Administered 2018-10-16: 1
  Filled 2018-10-16: qty 1

## 2018-10-16 MED ORDER — ALBUTEROL SULFATE HFA 108 (90 BASE) MCG/ACT IN AERS
2.0000 | INHALATION_SPRAY | Freq: Four times a day (QID) | RESPIRATORY_TRACT | Status: DC
  Administered 2018-10-16: 2 via RESPIRATORY_TRACT

## 2018-10-16 MED ORDER — ALUM & MAG HYDROXIDE-SIMETH 200-200-20 MG/5ML PO SUSP
30.0000 mL | Freq: Once | ORAL | Status: AC
Start: 2018-10-16 — End: 2018-10-16
  Administered 2018-10-16: 30 mL via ORAL
  Filled 2018-10-16: qty 30

## 2018-10-16 MED ORDER — BUDESONIDE-FORMOTEROL FUMARATE 160-4.5 MCG/ACT IN AERO: 2.0000 | INHALATION_SPRAY | Freq: Two times a day (BID) | RESPIRATORY_TRACT | 0 refills | Status: DC

## 2018-10-16 MED ORDER — PREDNISONE 50 MG PO TABS: 50.0000 mg | ORAL_TABLET | Freq: Every day | ORAL | 0 refills | Status: DC

## 2018-10-16 MED ORDER — ALBUTEROL SULFATE HFA 108 (90 BASE) MCG/ACT IN AERS
INHALATION_SPRAY | RESPIRATORY_TRACT | Status: AC
  Filled 2018-10-16: qty 6.7

## 2018-10-16 NOTE — ED Notes (Signed)
ED Provider at bedside. 

## 2018-10-16 NOTE — ED Triage Notes (Signed)
SOB for several days, EMS came out and gave him a breathing treatment this morning at 1100, then he went and sold plasma, and is now short of breath again, pt is out of his inhalers, maintenance and rescue

## 2018-10-16 NOTE — Discharge Instructions (Addendum)
Return here as needed. Follow up with your doctor. °

## 2018-10-16 NOTE — ED Notes (Signed)
Patient maintained 02 SATS of 96 to 97% while ambulating.

## 2018-10-16 NOTE — ED Provider Notes (Signed)
[facility] EMERGENCY DEPARTMENT Provider Note   CSN: [id number] Arrival date & time: 10/16/18  1725     History   Chief Complaint Chief Complaint  Patient presents with  . Shortness of Breath    HPI Colton Ford is a 58 y.o. male.  HPI Patient presents to the emergency department with shortness of breath that started several days ago.  The patient states that got worse today so he called EMS.  The patient states that they gave him a breathing treatment and he felt much better so he did not come to the hospital.  The patient states he donated plasma and then started having shortness of breath several hours later.  The patient states that nothing seems to make the condition better or worse.  The patient denies chest pain, shortness of breath, headache,blurred vision, neck pain, fever, cough, weakness, numbness, dizziness, anorexia, edema, abdominal pain, nausea, vomiting, diarrhea, rash, back pain, dysuria, hematemesis, bloody stool, near syncope, or syncope. Past Medical History:  Diagnosis Date  . Agitation   . Asthma   . Mild anemia     Patient Active Problem List   Diagnosis Date Noted  . Essential hypertension 04/12/2017  . Cigarette smoker 11/17/2014  . Asthma exacerbation 02/19/2012  . COPD GOLD II with reversibility  04/10/2011    History reviewed. No pertinent surgical history.      Home Medications    Prior to Admission medications   Medication Sig Start Date End Date Taking? Authorizing Provider  albuterol (PROAIR HFA) 108 (90 Base) MCG/ACT inhaler Inhale 2 puffs into the lungs every 6 (six) hours as needed for wheezing or shortness of breath. 2 puffs every 4 hours as needed only  if your can't catch your breath 04/10/17   Nyoka Cowden, MD  albuterol (PROVENTIL HFA;VENTOLIN HFA) 108 (90 BASE) MCG/ACT inhaler Inhale 2 puffs into the lungs every 6 (six) hours as needed for wheezing. 03/21/12 04/10/18  Nyoka Cowden, MD  budesonide-formoterol  (SYMBICORT) 160-4.5 MCG/ACT inhaler Take 2 puffs first thing in am and then another 2 puffs about 12 hours later. 05/12/16   Nyoka Cowden, MD  budesonide-formoterol (SYMBICORT) 160-4.5 MCG/ACT inhaler Inhale 2 puffs into the lungs 2 (two) times daily. 04/10/17   Nyoka Cowden, MD  budesonide-formoterol (SYMBICORT) 160-4.5 MCG/ACT inhaler Inhale 2 puffs into the lungs 2 (two) times daily. 04/10/17   Nyoka Cowden, MD  budesonide-formoterol (SYMBICORT) 160-4.5 MCG/ACT inhaler Inhale 2 puffs into the lungs 2 (two) times daily. 05/04/17   Nyoka Cowden, MD  ibuprofen (ADVIL,MOTRIN) 800 MG tablet Take 1 tablet (800 mg total) by mouth every 8 (eight) hours as needed for mild pain or moderate pain. 03/25/15   Trixie Dredge, PA-C  methocarbamol (ROBAXIN) 500 MG tablet Take 1 tablet (500 mg total) by mouth every 6 (six) hours as needed for muscle spasms (and pain). 03/25/15   Trixie Dredge, PA-C    Family History Family History  Problem Relation Age of Onset  . Asthma Sister   . Heart disease Sister   . Heart disease Brother     Social History Social History   Tobacco Use  . Smoking status: Current Some Day Smoker    Packs/day: 0.20    Years: 30.00    Pack years: 6.00    Types: Cigarettes  . Smokeless tobacco: Never Used  Substance Use Topics  . Alcohol use: No    Alcohol/week: 0.0 standard drinks  . Drug use: No  Allergies   Patient has no known allergies.   Review of Systems Review of Systems  All other systems negative except as documented in the HPI. All pertinent positives and negatives as reviewed in the HPI.  Physical Exam Updated Vital Signs BP 134/87 (BP Location: Right Arm)   Pulse 97   Temp 97.7 F (36.5 C) (Oral)   Resp 18   Ht 6' (1.829 m)   Wt 70.3 kg   SpO2 97%   BMI 21.02 kg/m   Physical Exam Vitals signs and nursing note reviewed.  Constitutional:      General: He is not in acute distress.    Appearance: He is well-developed.  HENT:     Head:  Normocephalic and atraumatic.  Eyes:     Pupils: Pupils are equal, round, and reactive to light.  Neck:     Musculoskeletal: Normal range of motion and neck supple.  Cardiovascular:     Rate and Rhythm: Normal rate and regular rhythm.     Heart sounds: Normal heart sounds. No murmur. No friction rub. No gallop.   Pulmonary:     Effort: Pulmonary effort is normal. No respiratory distress.     Breath sounds: Examination of the right-lower field reveals decreased breath sounds. Examination of the left-lower field reveals decreased breath sounds. Decreased breath sounds present. No wheezing.  Abdominal:     General: Bowel sounds are normal. There is no distension.     Palpations: Abdomen is soft.     Tenderness: There is no abdominal tenderness.  Skin:    General: Skin is warm and dry.     Capillary Refill: Capillary refill takes less than 2 seconds.     Findings: No erythema or rash.  Neurological:     Mental Status: He is alert and oriented to person, place, and time.     Motor: No abnormal muscle tone.     Coordination: Coordination normal.  Psychiatric:        Behavior: Behavior normal.      ED Treatments / Results  Labs (all labs ordered are listed, but only abnormal results are displayed) Labs Reviewed - No data to display  EKG None  Radiology Dg Chest 2 View  Result Date: 10/16/2018 CLINICAL DATA:  Shortness of breath for 4 days. Cough. History of asthma. EXAM: CHEST - 2 VIEW COMPARISON:  11/18/2015 FINDINGS: Hyperinflation compatible with history of asthma. Normal heart size and pulmonary vascularity. No focal airspace disease or consolidation in the lungs. No blunting of costophrenic angles. No pneumothorax. Mediastinal contours appear intact. Calcification of the aorta. Mild degenerative changes in the spine. IMPRESSION: Hyperinflation. No evidence of active pulmonary disease. Electronically Signed   By: Burman NievesWilliam  Stevens M.D.   On: 10/16/2018 19:15     Procedures Procedures (including critical care time)  Medications Ordered in ED Medications  albuterol (PROVENTIL) (2.5 MG/3ML) 0.083% nebulizer solution (2.5 mg  Given 10/16/18 1736)  ipratropium-albuterol (DUONEB) 0.5-2.5 (3) MG/3ML nebulizer solution (3 mLs  Given 10/16/18 1736)  predniSONE (DELTASONE) tablet 60 mg (60 mg Oral Given 10/16/18 1906)  alum & mag hydroxide-simeth (MAALOX/MYLANTA) 200-200-20 MG/5ML suspension 30 mL (30 mLs Oral Given 10/16/18 2010)    And  lidocaine (XYLOCAINE) 2 % viscous mouth solution 15 mL (15 mLs Oral Given 10/16/18 2010)     Initial Impression / Assessment and Plan / ED Course  I have reviewed the triage vital signs and the nursing notes.  Pertinent labs & imaging results that were available during my  care of the patient were reviewed by me and considered in my medical decision making (see chart for details).   At this point patient is not wheezing but does have some diminished breath sounds so I gave him another neb treatment along with steroids.  The patient be treated with steroids and given refills of his inhalers.  The patient is ambulated without significant difficulties and kept his oxygen saturations above 96%.  Final Clinical Impressions(s) / ED Diagnoses   Final diagnoses:  None    ED Discharge Orders    None       Kyra Manges 10/16/18 2043    Azalia Bilis, MD 10/17/18 1505

## 2018-10-29 ENCOUNTER — Ambulatory Visit (INDEPENDENT_AMBULATORY_CARE_PROVIDER_SITE_OTHER): Payer: Self-pay | Admitting: Internal Medicine

## 2018-10-29 ENCOUNTER — Encounter: Payer: Self-pay | Admitting: Internal Medicine

## 2018-10-29 VITALS — BP 140/90 | HR 91 | Ht 72.0 in | Wt 166.0 lb

## 2018-10-29 DIAGNOSIS — F17210 Nicotine dependence, cigarettes, uncomplicated: Secondary | ICD-10-CM

## 2018-10-29 DIAGNOSIS — J449 Chronic obstructive pulmonary disease, unspecified: Secondary | ICD-10-CM

## 2018-10-29 MED ORDER — MOMETASONE FURO-FORMOTEROL FUM 200-5 MCG/ACT IN AERO: 2.0000 | INHALATION_SPRAY | Freq: Two times a day (BID) | RESPIRATORY_TRACT | 0 refills | Status: DC

## 2018-10-29 MED ORDER — BUDESONIDE-FORMOTEROL FUMARATE 160-4.5 MCG/ACT IN AERO: 2.0000 | INHALATION_SPRAY | Freq: Two times a day (BID) | RESPIRATORY_TRACT | 3 refills | Status: DC

## 2018-10-29 NOTE — Progress Notes (Signed)
Subjective:     Patient ID: Colton Ford, male   DOB: 1961-01-20    MRN: [id number]    Brief patient profile:  68 yobm active smoker  Presented 04/02/11 with severe resp distress > Delta >  ICU on ventilator with dx asthma exac and proved to have GOLD III with reversibility  by pfts 05/02/2012 with tendency to flare in symptoms when runs out of meds   History of Present Illness  04/10/2011 Initial pulmonary office eval usually followed by [name] with h/o childhood asthma outgrew it around the 3rd grade with one episode of ER trip 2008  And since then required intermittent albuterol sometimes not for several months at a time but since 03/20/2011 needed it daily.  Addmit 7/1-12/2010 for status asthmaticus requiring vent overnight and extubated and discharged on advair on 7/3. Works at [employer] where dusty and hot but last worked there 6/29 and has not returned. Still sob variably present sitting still and some worse walking across the parking lot. No purulent sputum.   rec No change rx, reviewed difference between saba and maint rx     08/18/2013 f/u ov/Ladell Lea re: asthma flare/ again out of dulera Chief Complaint  Patient presents with  . Acute Visit    Pt c/o SOB constantly, congestion, prod cough with green mucous   using saba up to 4 x daily to control chest tightness and wheezing  rec Either use dulera 200 or symbicort 160 Take 2 puffs first thing in am and then another 2 puffs about 12 hours later.  Only use your albuterol (proaire) prn     10/29/2018  f/u ov/Dewain Platz re:  COPD GOLD III/ still smoking / uninsured  Chief Complaint  Patient presents with  . Acute Visit    Pt c/o increased SOB x 1 wk. He states he has had to call EMS x 2 due to SOB. He is SOB with or without exertion. He is using his albuterol inhaler 4 x daily on average.   Dyspnea:  Fine while on symbicort 160 2bid so main issue is access but now saba dep     No obvious day to day or daytime variability or assoc  excess/ purulent sputum or mucus plugs or hemoptysis or cp or chest tightness, subjective wheeze or overt sinus or hb symptoms.    . Also denies any obvious fluctuation of symptoms with weather or environmental changes or other aggravating or alleviating factors except as outlined above   No unusual exposure hx or h/o childhood pna/ asthma or knowledge of premature birth.  Current Allergies, Complete Past Medical History, Past Surgical History, Family History, and Social History were reviewed in [employer] record.  ROS  The following are not active complaints unless bolded Hoarseness, sore throat, dysphagia, dental problems, itching, sneezing,  nasal congestion or discharge of excess mucus or purulent secretions, ear ache,   fever, chills, sweats, unintended wt loss or wt gain, classically pleuritic or exertional cp,  orthopnea pnd or arm/hand swelling  or leg swelling, presyncope, palpitations, abdominal pain, anorexia, nausea, vomiting, diarrhea  or change in bowel habits or change in bladder habits, change in stools or change in urine, dysuria, hematuria,  rash, arthralgias, visual complaints, headache, numbness, weakness or ataxia or problems with walking or coordination,  change in mood or  memory.        Current Meds  Medication Sig  . albuterol (PROAIR HFA) 108 (90 Base) MCG/ACT inhaler Inhale 2 puffs into the lungs  every 6 (six) hours as needed for wheezing or shortness of breath. 2 puffs every 4 hours as needed only  if your can't catch your breath  . albuterol (PROVENTIL HFA;VENTOLIN HFA) 108 (90 Base) MCG/ACT inhaler Inhale 2 puffs into the lungs every 6 (six) hours as needed for wheezing.  .    . ibuprofen (ADVIL,MOTRIN) 800 MG tablet Take 1 tablet (800 mg total) by mouth every 8 (eight) hours as needed for mild pain or moderate pain.  . methocarbamol (ROBAXIN) 500 MG tablet Take 1 tablet (500 mg total) by mouth every 6 (six) hours as needed for muscle spasms  (and pain).  . [DISCONTINUED] budesonide-formoterol (SYMBICORT) 160-4.5 MCG/ACT inhaler Inhale 2 puffs into the lungs 2 (two) times daily.                 Objective:   Physical Exam  amb bm nad   Wt 172 04/10/2011 > 03/21/2012 184 > 07/24/2012  183 >  04/16/2013 185 > 08/18/2013  182 > 11/17/2014  160 > 04/10/2017  170>  10/29/2018  166    Vital signs reviewed - Note on arrival 02 sats  95% on RA     HEENT: nl dentition, turbinates bilaterally, and oropharynx. Nl external ear canals without cough reflex   NECK :  without JVD/Nodes/TM/ nl carotid upstrokes bilaterally   LUNGS: no acc muscle use,  Nl contour chest with scattered insp /exp rhonchi  bilaterally without cough on insp or exp maneuvers   CV:  RRR  no s3 or murmur or increase in P2, and no edema   ABD:  soft and nontender with nl inspiratory excursion in the supine position. No bruits or organomegaly appreciated, bowel sounds nl  MS:  Nl gait/ ext warm without deformities, calf tenderness, cyanosis or clubbing No obvious joint restrictions   SKIN: warm and dry without lesions    NEURO:  alert, approp, nl sensorium with  no motor or cerebellar deficits apparent.       I personally reviewed images and agree with radiology impression as follows:  CXR:   10/16/18  Hyperinflation. No evidence of active pulmonary disease.       Assessment:

## 2018-10-29 NOTE — Patient Instructions (Signed)
The key is to stop smoking completely before smoking completely stops you!  For smoking cessation classes call [phone]       Work on inhaler technique:  relax and gently blow all the way out then take a nice smooth deep breath back in, triggering the inhaler at same time you start breathing in.  Hold for up to 5 seconds if you can. Blow out thru nose. Rinse and gargle with water when done       Please schedule a follow up visit in 12 months but call sooner if needed

## 2018-10-30 ENCOUNTER — Encounter: Payer: Self-pay | Admitting: Internal Medicine

## 2018-10-30 NOTE — Assessment & Plan Note (Signed)
Counseled re importance of smoking cessation but did not meet time criteria for separate billing     I had an extended discussion with the patient reviewing all relevant studies completed to date and  lasting 10 minutes of a 15 minute office visit    Each maintenance medication was reviewed in detail including most importantly the difference between maintenance and prns and under what circumstances the prns are to be triggered using an action plan format that is not reflected in the computer generated alphabetically organized AVS.     Please see AVS for specific instructions unique to this visit that I personally wrote and verbalized to the the pt in detail and then reviewed with pt  by my nurse highlighting any  changes in therapy recommended at today's visit to their plan of care.

## 2018-10-30 NOTE — Assessment & Plan Note (Signed)
Active smoker unable to afford his meds     - PFT's 04/25/2011 FEV1  2.66 (72%) ratio 56 and 13% better p B2 with DLC0 87%     - PFT's 05/02/2012 FEV1    2.05 p B2 (34% better) and ratio still 51 and  DLCO 67 corrects to 84% for alv vol  - 10/29/2018  After extensive coaching inhaler device,  effectiveness =    75%   Advised him about the cost of his care should he end up having to go to the emergency room or be admitted because he is not getting access to the appropriate maintenance medicine and he is still smoking AGAINST MEDICAL ADVICE.  I therefore strongly recommended that he stop smoking and we helped him fill out paperwork for assistance from [employer] for his Symbicort as well as gave him samples today.

## 2018-11-28 ENCOUNTER — Telehealth: Payer: Self-pay | Admitting: Internal Medicine

## 2018-11-28 MED ORDER — BUDESONIDE-FORMOTEROL FUMARATE 160-4.5 MCG/ACT IN AERO: 2.0000 | INHALATION_SPRAY | Freq: Two times a day (BID) | RESPIRATORY_TRACT | 0 refills | Status: DC

## 2018-11-28 NOTE — Telephone Encounter (Signed)
Spoke with pt, advised him I will leave a Symbicort inhaler up front for him to pick up. Pt understood and nothing further is needed.

## 2018-12-19 ENCOUNTER — Telehealth: Payer: Self-pay | Admitting: Internal Medicine

## 2018-12-19 NOTE — Telephone Encounter (Signed)
LMTCB

## 2018-12-20 NOTE — Telephone Encounter (Signed)
Unable to reach LMTCB 

## 2018-12-20 NOTE — Telephone Encounter (Signed)
Pt is returning call. Cb is [phone] Please leave VM with info if samples are avil.

## 2018-12-23 NOTE — Telephone Encounter (Signed)
Pt is calling back [phone]

## 2018-12-23 NOTE — Telephone Encounter (Signed)
Called the patient and advised him the samples are limited and a prescription can be sent to the pharmacy.  Patient stated his resources are limited and cannot afford the medication. Advised the patient that only one sample can be given at this time as we are limited on samples and not guaranteed when additional will be received.  Patient voiced understanding. Will pick up the sample tomorrow. Nothing further needed.

## 2019-01-03 ENCOUNTER — Telehealth: Payer: Self-pay

## 2019-01-03 NOTE — Telephone Encounter (Signed)
Patient wife came by office to drop off patient assistance paperwork. Copy made and placed upfront to be sent to scan. Originals placed in MW box for nurse to address. Will route message to nurse to update chart once paper work has been faxed and a determination has been made.

## 2019-01-03 NOTE — Telephone Encounter (Signed)
Pt is requesting update on pt assistance forms. Cb is [phone] or [phone]

## 2019-01-03 NOTE — Telephone Encounter (Signed)
[name] do you remember if these forms were faxed? I called [employer] and they stated they was no application on file.

## 2019-01-06 NOTE — Telephone Encounter (Signed)
Attempted to call patient, no VM available to LVM for patient letting him know that the fax was faxed for on 01/02/2019. X1

## 2019-01-06 NOTE — Telephone Encounter (Signed)
I was out of the office 01/03/2019  Forms were faxed this morning  ATC, NA and no option to leave msg on machine

## 2019-01-07 NOTE — Telephone Encounter (Signed)
ATC, NA and no option to leave msg 

## 2019-01-08 NOTE — Telephone Encounter (Signed)
Attempted call to patient. No answer. Voicemail only asks for 'remote access code' and cannot leave message.  Third attempt for call back. Will close encounter per triage protocol since unsuccessful contact with patient.

## 2019-04-20 ENCOUNTER — Other Ambulatory Visit: Payer: Self-pay

## 2019-04-20 ENCOUNTER — Emergency Department (HOSPITAL_COMMUNITY)
Admission: EM | Admit: 2019-04-20 | Discharge: 2019-04-20 | Disposition: A | Discharge status: Home / Self Care | Payer: Self-pay | Attending: Emergency Medicine | Admitting: Emergency Medicine

## 2019-04-20 ENCOUNTER — Encounter (HOSPITAL_COMMUNITY): Payer: Self-pay

## 2019-04-20 DIAGNOSIS — J4541 Moderate persistent asthma with (acute) exacerbation: Secondary | ICD-10-CM | POA: Insufficient documentation

## 2019-04-20 DIAGNOSIS — Z720 Tobacco use: Secondary | ICD-10-CM | POA: Insufficient documentation

## 2019-04-20 DIAGNOSIS — I10 Essential (primary) hypertension: Secondary | ICD-10-CM | POA: Insufficient documentation

## 2019-04-20 DIAGNOSIS — Z79899 Other long term (current) drug therapy: Secondary | ICD-10-CM | POA: Insufficient documentation

## 2019-04-20 MED ORDER — PREDNISONE 50 MG PO TABS: 50.0000 mg | ORAL_TABLET | Freq: Every day | ORAL | 0 refills | Status: DC

## 2019-04-20 MED ORDER — IPRATROPIUM-ALBUTEROL 0.5-2.5 (3) MG/3ML IN SOLN
3.0000 mL | Freq: Once | RESPIRATORY_TRACT | Status: AC
  Administered 2019-04-20: 3 mL via RESPIRATORY_TRACT
  Filled 2019-04-20: qty 3

## 2019-04-20 NOTE — ED Provider Notes (Signed)
[facility] DEPT Provider Note   CSN: [id number] Arrival date & time: 04/20/19  0214    History   Chief Complaint Chief Complaint  Patient presents with  . Shortness of Breath    HPI Colton Ford is a 58 y.o. male.   The history is provided by the patient.  Shortness of Breath He has a history of hypertension, asthma, tobacco use and comes in because of difficulty breathing.  He states that he was supposed to get a refill of his prescription for Symbicort, but it had not arrived.  He has not had his Symbicort for 3 days.  He started having difficulty breathing yesterday which got worse tonight.  He has been using his rescue inhaler which has only been giving him limited relief.  He came in by ambulance and was given methylprednisolone and albuterol nebulizer treatment in the ambulance with significant improvement.  He feels he is almost back to his baseline.  He denies fever, chills, sweats.  He denies cough.  He denies chest pain.  He denies exposure to COVID-19.  Past Medical History:  Diagnosis Date  . Agitation   . Asthma   . Mild anemia     Patient Active Problem List   Diagnosis Date Noted  . Essential hypertension 04/12/2017  . Cigarette smoker 11/17/2014  . Asthma exacerbation 02/19/2012  . COPD GOLD II with reversibility  04/10/2011    No past surgical history on file.      Home Medications    Prior to Admission medications   Medication Sig Start Date End Date Taking? Authorizing Provider  albuterol (PROAIR HFA) 108 (90 Base) MCG/ACT inhaler Inhale 2 puffs into the lungs every 6 (six) hours as needed for wheezing or shortness of breath. 2 puffs every 4 hours as needed only  if your can't catch your breath 04/10/17   Tanda Rockers, MD  albuterol (PROVENTIL HFA;VENTOLIN HFA) 108 (90 Base) MCG/ACT inhaler Inhale 2 puffs into the lungs every 6 (six) hours as needed for wheezing. 10/16/18 11/04/24  Lawyer, Harrell Gave, PA-C   budesonide-formoterol (SYMBICORT) 160-4.5 MCG/ACT inhaler Inhale 2 puffs into the lungs 2 (two) times daily. 11/28/18   Tanda Rockers, MD  ibuprofen (ADVIL,MOTRIN) 800 MG tablet Take 1 tablet (800 mg total) by mouth every 8 (eight) hours as needed for mild pain or moderate pain. 03/25/15   Clayton Bibles, PA-C  methocarbamol (ROBAXIN) 500 MG tablet Take 1 tablet (500 mg total) by mouth every 6 (six) hours as needed for muscle spasms (and pain). 03/25/15   Clayton Bibles, PA-C    Family History Family History  Problem Relation Age of Onset  . Asthma Sister   . Heart disease Sister   . Heart disease Brother     Social History Social History   Tobacco Use  . Smoking status: Current Some Day Smoker    Packs/day: 0.20    Years: 30.00    Pack years: 6.00    Types: Cigarettes  . Smokeless tobacco: Never Used  Substance Use Topics  . Alcohol use: No    Alcohol/week: 0.0 standard drinks  . Drug use: No     Allergies   Patient has no known allergies.   Review of Systems Review of Systems  Respiratory: Positive for shortness of breath.   All other systems reviewed and are negative.    Physical Exam Updated Vital Signs BP (!) 166/90   Pulse 79   Temp 97.8 F (36.6 C) (Oral)  Resp 20   Ht 6' (1.829 m)   Wt 78 kg   SpO2 94%   BMI 23.33 kg/m   Physical Exam Vitals signs and nursing note reviewed.    58 year old male, resting comfortably and in no acute distress. Vital signs are significant for elevated blood pressure. Oxygen saturation is 94%, which is normal. Head is normocephalic and atraumatic. PERRLA, EOMI. Oropharynx is clear. Neck is nontender and supple without adenopathy or JVD. Back is nontender and there is no CVA tenderness. Lungs have slightly prolonged exhalation phase with faint wheezing.  There are no rales or rhonchi. Chest is nontender. Heart has regular rate and rhythm without murmur. Abdomen is soft, flat, nontender without masses or hepatosplenomegaly  and peristalsis is normoactive. Extremities have no cyanosis or edema, full range of motion is present. Skin is warm and dry without rash. Neurologic: Mental status is normal, cranial nerves are intact, there are no motor or sensory deficits.  ED Treatments / Results   Procedures Procedures  Medications Ordered in ED Medications  ipratropium-albuterol (DUONEB) 0.5-2.5 (3) MG/3ML nebulizer solution 3 mL (3 mLs Nebulization Given 04/20/19 0315)     Initial Impression / Assessment and Plan / ED Course  I have reviewed the triage vital signs and the nursing notes.  Exacerbation of asthma.  He is given a nebulizer treatment with albuterol and ipratropium, and will be sent home with prescription for prednisone.  Old records are reviewed, and he does have prior ED visits for asthma.  He feels much better after above-noted treatment.  On reexam, lungs are clear.  He is discharged with prescription for prednisone, follow-up with PCP as needed.  Final Clinical Impressions(s) / ED Diagnoses   Final diagnoses:  Moderate persistent asthma with exacerbation    ED Discharge Orders         Ordered    predniSONE (DELTASONE) 50 MG tablet  Daily,   Status:  Discontinued     04/20/19 0503    predniSONE (DELTASONE) 50 MG tablet  Daily     04/20/19 0503           Dione BoozeGlick, Angeleen Horney, MD 04/20/19 [phone]

## 2019-04-20 NOTE — ED Triage Notes (Signed)
Pt arrived with complaints of shortness of breath, states he takes Symbicort daily but ran out Thursday. He has been using his inhaler since with no relief. Pt wheezing, neb treatment and 125 of solumedrol given prior to arrival.

## 2019-07-01 ENCOUNTER — Telehealth: Payer: Self-pay | Admitting: Internal Medicine

## 2019-07-01 MED ORDER — BUDESONIDE-FORMOTEROL FUMARATE 160-4.5 MCG/ACT IN AERO: 2.0000 | INHALATION_SPRAY | Freq: Two times a day (BID) | RESPIRATORY_TRACT | 0 refills | Status: DC

## 2019-07-01 NOTE — Telephone Encounter (Signed)
Patient stopped by office for sample. He reports [facility] has mailed his refill however the mail system is backed up. He reports the last time this happened it took a month for his refill to come. Refill given. Advised that he call back once that supply is gone to see if we have more refills if he has not received the refill. Voiced understanding. Nothing further needed at this time.

## 2019-10-30 ENCOUNTER — Ambulatory Visit: Payer: Self-pay | Admitting: Primary Care

## 2019-10-30 ENCOUNTER — Ambulatory Visit: Payer: Self-pay | Admitting: Internal Medicine

## 2019-12-01 ENCOUNTER — Encounter: Payer: Self-pay | Admitting: Internal Medicine

## 2019-12-01 ENCOUNTER — Other Ambulatory Visit: Payer: Self-pay

## 2019-12-01 ENCOUNTER — Ambulatory Visit (INDEPENDENT_AMBULATORY_CARE_PROVIDER_SITE_OTHER): Payer: Self-pay | Admitting: Internal Medicine

## 2019-12-01 DIAGNOSIS — F17210 Nicotine dependence, cigarettes, uncomplicated: Secondary | ICD-10-CM

## 2019-12-01 DIAGNOSIS — J449 Chronic obstructive pulmonary disease, unspecified: Secondary | ICD-10-CM

## 2019-12-01 MED ORDER — ALBUTEROL SULFATE HFA 108 (90 BASE) MCG/ACT IN AERS: 2.0000 | INHALATION_SPRAY | RESPIRATORY_TRACT | 11 refills | Status: DC | PRN

## 2019-12-01 NOTE — Patient Instructions (Signed)
Plan A = Automatic = Always=    symbicort 160 Take 2 puffs first thing in am and then another 2 puffs about 12 hours later.      Plan B = Backup (to supplement plan A, not to replace it) Only use your albuterol inhaler as a rescue medication to be used if you can't catch your breath by resting or doing a relaxed purse lip breathing pattern.  - The less you use it, the better it will work when you need it. - Ok to use the inhaler up to 2 puffs  every 4 hours if you must but call for appointment if use goes up over your usual need - Don't leave home without it !!  (think of it like the spare tire for your car)    Please schedule a follow up visit in 3 months but call sooner if needed

## 2019-12-01 NOTE — Assessment & Plan Note (Signed)
Active smoker unable to afford his meds     - PFT's 04/25/2011 FEV1  2.66 (72%) ratio 56 and 13% better p B2 with DLC0 87%     - PFT's 05/02/2012 FEV1    2.05 p B2 (34% better) and ratio still 51 and  DLCO 67 corrects to 84% for alv vol  - 10/29/2018  After extensive coaching inhaler device,  effectiveness =    75%   AB well controlled when has access to symbicort 160 > no change rx

## 2019-12-01 NOTE — Assessment & Plan Note (Signed)
Counseled re importance of smoking cessation but did not meet time criteria for separate billing           Each maintenance medication was reviewed in detail including emphasizing most importantly the difference between maintenance and prns and under what circumstances the prns are to be triggered using an action plan format where appropriate.  Total time for H and P, chart review, counseling, teaching device and generating customized AVS unique to this office visit / charting = 15 min        

## 2019-12-01 NOTE — Progress Notes (Signed)
Subjective:     Patient ID: Colton Ford, male   DOB: 1960/12/07    MRN: [id number]    Brief patient profile:  62  yobm active smoker  Presented 04/02/11 with severe resp distress > [place] >  ICU on ventilator with dx asthma exac and proved to have GOLD III with reversibility  by pfts 05/02/2012 with tendency to flare in symptoms when runs out of meds   History of Present Illness  04/10/2011 Initial pulmonary office eval usually followed by Colton Ford with h/o childhood asthma outgrew it around the 3rd grade with one episode of ER trip 2008  And since then required intermittent albuterol sometimes not for several months at a time but since 03/20/2011 needed it daily.  Addmit 7/1-12/2010 for status asthmaticus requiring vent overnight and extubated and discharged on advair on 7/3. Works at [employer] where dusty and hot but last worked there 6/29 and has not returned. Still sob variably present sitting still and some worse walking across the parking lot. No purulent sputum.   rec No change rx, reviewed difference between saba and maint rx     08/18/2013 f/u ov/Rendi Mapel re: asthma flare/ again out of [place] Chief Complaint  Patient presents with  . Acute Visit    Pt c/o SOB constantly, congestion, prod cough with green mucous   using saba up to 4 x daily to control chest tightness and wheezing  rec Either use dulera 200 or symbicort 160 Take 2 puffs first thing in am and then another 2 puffs about 12 hours later.  Only use your albuterol (proaire) prn     10/29/2018  f/u ov/Sandeep Radell re:  COPD GOLD III/ still smoking / uninsured  Chief Complaint  Patient presents with  . Acute Visit    Pt c/o increased SOB x 1 wk. He states he has had to call EMS x 2 due to SOB. He is SOB with or without exertion. He is using his albuterol inhaler 4 x daily on average.   Dyspnea:  Fine while on symbicort 160 2bid so main issue is access but now saba dep  rec The key is to stop smoking completely before smoking  completely stops you! For smoking cessation classes call [phone]  Work on inhaler technique:  relax and gently blow all the way out then take a nice smooth deep breath back in, triggering the inhaler at same time you start breathing in.  Hold for up to 5 seconds if you can. Blow out thru nose. Rinse and gargle with water when done   12/01/2019  f/u ov/Cordaro Mukai re: GOLD III/ still smoking/ no saba / seeking [place] & [place] help on symbicort  Chief Complaint  Patient presents with  . Follow-up    Breathing is overall doing well "as long as I have the symbciort"- rarely uses albuterol.   Dyspnea:  Not limited by breathing from desired activities   Cough: no Sleeping: no resp cc's SABA use: rarely as long as has symbicort  02: non   No obvious day to day or daytime variability or assoc excess/ purulent sputum or mucus plugs or hemoptysis or cp or chest tightness, subjective wheeze or overt sinus or hb symptoms.   Sleeping  without nocturnal  or early am exacerbation  of respiratory  c/o's or need for noct saba. Also denies any obvious fluctuation of symptoms with weather or environmental changes or other aggravating or alleviating factors except as outlined above   No unusual exposure hx or  h/o childhood pna/ asthma or knowledge of premature birth.  Current Allergies, Complete Past Medical History, Past Surgical History, Family History, and Social History were reviewed in [facility] record.  ROS  The following are not active complaints unless bolded Hoarseness, sore throat, dysphagia, dental problems, itching, sneezing,  nasal congestion or discharge of excess mucus or purulent secretions, ear ache,   fever, chills, sweats, unintended wt loss or wt gain, classically pleuritic or exertional cp,  orthopnea pnd or arm/hand swelling  or leg swelling, presyncope, palpitations, abdominal pain, anorexia, nausea, vomiting, diarrhea  or change in bowel habits or change in bladder habits,  change in stools or change in urine, dysuria, hematuria,  rash, arthralgias, visual complaints, headache, numbness, weakness or ataxia or problems with walking or coordination,  change in mood or  memory.        Current Meds  Medication Sig  . albuterol (PROAIR HFA) 108 (90 Base) MCG/ACT inhaler Inhale 2 puffs into the lungs every 6 (six) hours as needed for wheezing or shortness of breath. 2 puffs every 4 hours as needed only  if your can't catch your breath  . budesonide-formoterol (SYMBICORT) 160-4.5 MCG/ACT inhaler Inhale 2 puffs into the lungs 2 (two) times daily.                 Objective:   Physical Exam  amb bm nad  12/01/2019    168  10/29/2018  166  Wt 172 04/10/2011 > 03/21/2012 184 > 07/24/2012  183 >  04/16/2013 185 > 08/18/2013  182 > 11/17/2014  160 > 04/10/2017  170>    Vital signs reviewed  12/01/2019  - Note at rest 02 sats  95% on RA    HEENT : pt wearing mask not removed for exam due to covid - 19 concerns.   NECK :  without JVD/Nodes/TM/ nl carotid upstrokes bilaterally   LUNGS: no acc muscle use,  Min barrel  contour chest wall with bilateral  slightly decreased bs s audible wheeze and  without cough on insp or exp maneuvers and min  Hyperresonant  to  percussion bilaterally     CV:  RRR  no s3 or murmur or increase in P2, and no edema   ABD:  soft and nontender with pos end  insp Hoover's  in the supine position. No bruits or organomegaly appreciated, bowel sounds nl  MS:   Nl gait/  ext warm without deformities, calf tenderness, cyanosis or clubbing No obvious joint restrictions   SKIN: warm and dry without lesions    NEURO:  alert, approp, nl sensorium with  no motor or cerebellar deficits apparent.                Assessment:

## 2020-01-01 ENCOUNTER — Telehealth: Payer: Self-pay | Admitting: Internal Medicine

## 2020-01-01 NOTE — Telephone Encounter (Signed)
Spoke with pt. He is needing samples of Symbicort. Advised him that we do not have samples of Symbicort. Pt became very agitated and angry. I offered to send in a prescription but he very rudely declined. Nothing further was needed.

## 2020-01-07 ENCOUNTER — Telehealth: Payer: Self-pay | Admitting: Internal Medicine

## 2020-01-07 NOTE — Telephone Encounter (Signed)
Spoke with pt. He is requesting samples of Symbicort. Advised him that we do not get samples of this anymore now that it's a generic medication. Nothing further was needed.

## 2020-01-15 ENCOUNTER — Telehealth: Payer: Self-pay | Admitting: Internal Medicine

## 2020-01-15 ENCOUNTER — Other Ambulatory Visit: Payer: Self-pay | Admitting: Internal Medicine

## 2020-01-15 MED ORDER — BUDESONIDE-FORMOTEROL FUMARATE 160-4.5 MCG/ACT IN AERO: 2.0000 | INHALATION_SPRAY | Freq: Two times a day (BID) | RESPIRATORY_TRACT | 3 refills | Status: DC

## 2020-01-15 NOTE — Telephone Encounter (Signed)
ATC the number provided to call pt's wife, this number is no longer in service. Did the pt fill out the patient assistance application, the prescription portion is on the form. ATC pt's number, line rang several times then went to a busy signal. Will try back.

## 2020-01-16 MED ORDER — BUDESONIDE-FORMOTEROL FUMARATE 160-4.5 MCG/ACT IN AERO: 2.0000 | INHALATION_SPRAY | Freq: Two times a day (BID) | RESPIRATORY_TRACT | 3 refills | Status: DC

## 2020-01-16 NOTE — Telephone Encounter (Signed)
ATC patient, phone rang twice before going to a busy signal.   Called wife and spoke with her. She stated that the patient is already enrolled into the [facility] program and just needs to have a prescription sent to them. He is currently out of Symbicort. I did advised her that I could send a message to Dr. Sherene Sires to see if we could give him samples of Breztri to last until his shipment arrived, she agreed to this.   MW, please advise if we can provide him with 2 samples of Breztri to last until he receives his Symbicort? Thanks!

## 2020-01-16 NOTE — Telephone Encounter (Signed)
That's fine

## 2020-01-16 NOTE — Telephone Encounter (Signed)
ATC pt, line rang several times then went to a busy signal. °Will try back. °

## 2020-01-19 MED ORDER — BREZTRI AEROSPHERE 160-9-4.8 MCG/ACT IN AERO: 2.0000 | INHALATION_SPRAY | Freq: Every day | RESPIRATORY_TRACT | 0 refills | Status: DC

## 2020-01-19 NOTE — Telephone Encounter (Signed)
Patient's wife Celene Kras is returning phone call. Kimmye phone number is [phone].

## 2020-01-19 NOTE — Telephone Encounter (Signed)
Spoke with pt's wife, advised her that I would leave samples of Breztri up front to be picked up. She understood and nothing further is needed.

## 2020-01-19 NOTE — Telephone Encounter (Signed)
Attempted to call pt's wife Kimmye but line went straight to VM. Left message for her to return call.

## 2020-01-19 NOTE — Telephone Encounter (Signed)
Will someone at the [facility] clinic f/u on this and provide pt with samples of the [facility]- I am currently in another location and unable to leave samples, thanks

## 2020-03-02 ENCOUNTER — Ambulatory Visit: Payer: Self-pay | Admitting: Primary Care

## 2020-03-02 ENCOUNTER — Ambulatory Visit: Payer: Self-pay | Admitting: Internal Medicine

## 2020-09-23 ENCOUNTER — Telehealth: Payer: Self-pay | Admitting: Internal Medicine

## 2020-09-23 MED ORDER — BREZTRI AEROSPHERE 160-9-4.8 MCG/ACT IN AERO: 2.0000 | INHALATION_SPRAY | Freq: Two times a day (BID) | RESPIRATORY_TRACT | 0 refills | Status: DC

## 2020-09-23 NOTE — Telephone Encounter (Signed)
That's fine

## 2020-09-23 NOTE — Telephone Encounter (Signed)
Patient returned call, he states he has been unable to refill his symbicort from [place] because of a system problem.  He states he was not able to get through to speak with someone.  I advised I would call and see what could find out and if it is going to be a while until he can get his medication, I will contact Dr. Sherene Sires to see what inhaler he can use in the meantime, the last time this happened, we gave him a sample of Breztri.  I called [place] and received a recorded message that the hold time is greater than 30 minutes.  Dr. Sherene Sires, Do you want the patient to have a sample of Breztri until he can get his next shipment of Symbicort in the mail from [place]?  Please advise.  Thank you.

## 2020-09-23 NOTE — Telephone Encounter (Signed)
Called and spoke with patient's wife, Selena Batten, listed on DPR, advised that Dr. Sherene Sires said it is ok to provide samples until he can get his Symbicort from [place].  Advised that the office is currently closed until Monday.  I asked if she is currently out and about and she was.  I asked how far she was and she said 10 minutes.  I let her know that I would meet her at the door with the samples.  She was appreciative.  Nothing further needed.

## 2020-09-23 NOTE — Telephone Encounter (Signed)
ATC x1, [phone] LMTCB.  ATC x1 [phone], spoke with wife, Maudie Mercury, she was at [place] and he was on the other side, she will have him call us back.

## 2021-01-11 ENCOUNTER — Telehealth: Payer: Self-pay | Admitting: Internal Medicine

## 2021-01-11 MED ORDER — BREZTRI AEROSPHERE 160-9-4.8 MCG/ACT IN AERO: 2.0000 | INHALATION_SPRAY | Freq: Two times a day (BID) | RESPIRATORY_TRACT | 0 refills | Status: DC

## 2021-01-11 NOTE — Telephone Encounter (Signed)
Called and spoke with patient. He stated that he is completely out of his Colton Ford and wanted to know if he had any samples. I advised him I could leave 2 samples up front after he agrees to get scheduled for an appt. He agreed. He has been scheduled to see TP on 01/24/21 at 3pm. He wants to apply for patient assistance for Breztri at this visit. He is aware that I have placed the samples up front for him.   Nothing further needed at time of call.

## 2021-01-24 ENCOUNTER — Ambulatory Visit: Payer: Self-pay | Admitting: Adult Health

## 2021-09-07 ENCOUNTER — Observation Stay (HOSPITAL_COMMUNITY)
Admission: EM | Admit: 2021-09-07 | Discharge: 2021-09-08 | Disposition: A | Discharge status: Home / Self Care | Payer: Self-pay | Attending: Student in an Organized Health Care Education/Training Program | Admitting: Student in an Organized Health Care Education/Training Program

## 2021-09-07 ENCOUNTER — Other Ambulatory Visit: Payer: Self-pay

## 2021-09-07 ENCOUNTER — Emergency Department (HOSPITAL_COMMUNITY): Payer: Self-pay

## 2021-09-07 DIAGNOSIS — J441 Chronic obstructive pulmonary disease with (acute) exacerbation: Principal | ICD-10-CM | POA: Diagnosis present

## 2021-09-07 DIAGNOSIS — Z79899 Other long term (current) drug therapy: Secondary | ICD-10-CM | POA: Insufficient documentation

## 2021-09-07 DIAGNOSIS — J4541 Moderate persistent asthma with (acute) exacerbation: Secondary | ICD-10-CM

## 2021-09-07 DIAGNOSIS — J45909 Unspecified asthma, uncomplicated: Secondary | ICD-10-CM | POA: Diagnosis present

## 2021-09-07 DIAGNOSIS — F17200 Nicotine dependence, unspecified, uncomplicated: Secondary | ICD-10-CM | POA: Diagnosis present

## 2021-09-07 DIAGNOSIS — F17210 Nicotine dependence, cigarettes, uncomplicated: Secondary | ICD-10-CM | POA: Insufficient documentation

## 2021-09-07 DIAGNOSIS — R0902 Hypoxemia: Secondary | ICD-10-CM

## 2021-09-07 DIAGNOSIS — I10 Essential (primary) hypertension: Secondary | ICD-10-CM | POA: Insufficient documentation

## 2021-09-07 DIAGNOSIS — Z20822 Contact with and (suspected) exposure to covid-19: Secondary | ICD-10-CM | POA: Insufficient documentation

## 2021-09-07 LAB — BLOOD GAS, VENOUS
Acid-Base Excess: 4.4 mmol/L — ABNORMAL HIGH (ref 0.0–2.0)
Bicarbonate: 30.5 mmol/L — ABNORMAL HIGH (ref 20.0–28.0)
FIO2: 21
O2 Saturation: 39.2 %
Patient temperature: 37
pCO2, Ven: 65 mmHg — ABNORMAL HIGH (ref 44.0–60.0)
pH, Ven: 7.293 (ref 7.250–7.430)
pO2, Ven: <31 mmHg — CL (ref 32.0–45.0)

## 2021-09-07 LAB — CBC WITH DIFFERENTIAL/PLATELET
Abs Immature Granulocytes: 0.04 10*3/uL (ref 0.00–0.07)
Basophils Absolute: 0 10*3/uL (ref 0.0–0.1)
Basophils Relative: 0 %
Eosinophils Absolute: 0.1 10*3/uL (ref 0.0–0.5)
Eosinophils Relative: 1 %
HCT: 45.5 % (ref 39.0–52.0)
Hemoglobin: 14.4 g/dL (ref 13.0–17.0)
Immature Granulocytes: 0 %
Lymphocytes Relative: 9 %
Lymphs Abs: 0.8 10*3/uL (ref 0.7–4.0)
MCH: 22.3 pg — ABNORMAL LOW (ref 26.0–34.0)
MCHC: 31.6 g/dL (ref 30.0–36.0)
MCV: 70.4 fL — ABNORMAL LOW (ref 80.0–100.0)
Monocytes Absolute: 0.4 10*3/uL (ref 0.1–1.0)
Monocytes Relative: 4 %
Neutro Abs: 8.3 10*3/uL — ABNORMAL HIGH (ref 1.7–7.7)
Neutrophils Relative %: 86 %
Platelets: 364 10*3/uL (ref 150–400)
RBC: 6.46 MIL/uL — ABNORMAL HIGH (ref 4.22–5.81)
RDW: 17.7 % — ABNORMAL HIGH (ref 11.5–15.5)
WBC: 9.8 10*3/uL (ref 4.0–10.5)
nRBC: 0 % (ref 0.0–0.2)

## 2021-09-07 LAB — BASIC METABOLIC PANEL
Anion gap: 10 (ref 5–15)
BUN: 11 mg/dL (ref 8–23)
CO2: 29 mmol/L (ref 22–32)
Calcium: 9.3 mg/dL (ref 8.9–10.3)
Chloride: 97 mmol/L — ABNORMAL LOW (ref 98–111)
Creatinine, Ser: 1.06 mg/dL (ref 0.61–1.24)
GFR, Estimated: >60 mL/min (ref >60)
Glucose, Bld: 150 mg/dL — ABNORMAL HIGH (ref 70–99)
Potassium: 3.8 mmol/L (ref 3.5–5.1)
Sodium: 136 mmol/L (ref 135–145)

## 2021-09-07 LAB — RESP PANEL BY RT-PCR (FLU A&B, COVID) ARPGX2
Influenza A by PCR: NEGATIVE
Influenza B by PCR: NEGATIVE
SARS Coronavirus 2 by RT PCR: NEGATIVE

## 2021-09-07 LAB — BRAIN NATRIURETIC PEPTIDE: B Natriuretic Peptide: 15.3 pg/mL (ref 0.0–100.0)

## 2021-09-07 LAB — TROPONIN I (HIGH SENSITIVITY)
Troponin I (High Sensitivity): 9 ng/L (ref <18)
Troponin I (High Sensitivity): 9 ng/L (ref <18)

## 2021-09-07 LAB — MAGNESIUM: Magnesium: 2.9 mg/dL — ABNORMAL HIGH (ref 1.7–2.4)

## 2021-09-07 LAB — D-DIMER, QUANTITATIVE: D-Dimer, Quant: <0.27 ug/mL-FEU (ref 0.00–0.50)

## 2021-09-07 MED ORDER — ACETAMINOPHEN 325 MG PO TABS: 650.0000 mg | ORAL_TABLET | Freq: Four times a day (QID) | ORAL | Status: DC | PRN

## 2021-09-07 MED ORDER — ENOXAPARIN SODIUM 40 MG/0.4ML IJ SOSY
40.0000 mg | PREFILLED_SYRINGE | INTRAMUSCULAR | Status: DC
  Administered 2021-09-07: 40 mg via SUBCUTANEOUS
  Filled 2021-09-07: qty 0.4

## 2021-09-07 MED ORDER — METHYLPREDNISOLONE SODIUM SUCC 125 MG IJ SOLR
125.0000 mg | Freq: Once | INTRAMUSCULAR | Status: AC
  Administered 2021-09-07: 125 mg via INTRAVENOUS
  Filled 2021-09-07: qty 2

## 2021-09-07 MED ORDER — IPRATROPIUM-ALBUTEROL 0.5-2.5 (3) MG/3ML IN SOLN
3.0000 mL | RESPIRATORY_TRACT | Status: DC
  Administered 2021-09-07 – 2021-09-08 (×5): 3 mL via RESPIRATORY_TRACT
  Filled 2021-09-07 (×5): qty 3

## 2021-09-07 MED ORDER — ALBUTEROL SULFATE (2.5 MG/3ML) 0.083% IN NEBU
5.0000 mg | INHALATION_SOLUTION | Freq: Once | RESPIRATORY_TRACT | Status: AC
  Administered 2021-09-07: 5 mg via RESPIRATORY_TRACT
  Filled 2021-09-07: qty 6

## 2021-09-07 MED ORDER — SENNA 8.6 MG PO TABS: 1.0000 | ORAL_TABLET | Freq: Every evening | ORAL | Status: DC | PRN

## 2021-09-07 MED ORDER — IPRATROPIUM-ALBUTEROL 0.5-2.5 (3) MG/3ML IN SOLN
3.0000 mL | RESPIRATORY_TRACT | Status: AC
  Administered 2021-09-07: 3 mL via RESPIRATORY_TRACT
  Filled 2021-09-07: qty 3

## 2021-09-07 MED ORDER — MAGNESIUM SULFATE 2 GM/50ML IV SOLN
2.0000 g | Freq: Once | INTRAVENOUS | Status: AC
  Administered 2021-09-07: 2 g via INTRAVENOUS
  Filled 2021-09-07: qty 50

## 2021-09-07 MED ORDER — MOMETASONE FURO-FORMOTEROL FUM 200-5 MCG/ACT IN AERO
2.0000 | INHALATION_SPRAY | Freq: Two times a day (BID) | RESPIRATORY_TRACT | Status: DC
  Administered 2021-09-07: 2 via RESPIRATORY_TRACT
  Filled 2021-09-07 (×2): qty 8.8

## 2021-09-07 MED ORDER — ALBUTEROL SULFATE (2.5 MG/3ML) 0.083% IN NEBU
3.0000 mL | INHALATION_SOLUTION | Freq: Four times a day (QID) | RESPIRATORY_TRACT | Status: DC | PRN
  Administered 2021-09-08: 3 mL via RESPIRATORY_TRACT
  Filled 2021-09-07: qty 3

## 2021-09-07 MED ORDER — PREDNISONE 20 MG PO TABS
40.0000 mg | ORAL_TABLET | Freq: Every day | ORAL | Status: DC
  Administered 2021-09-08: 40 mg via ORAL
  Filled 2021-09-07: qty 2

## 2021-09-07 NOTE — Hospital Course (Signed)
Smoking since he was 12. Quit smoking about 2 weeks. Previously smoking half a pack a day. 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

## 2021-09-07 NOTE — H&P (Addendum)
Date: 09/07/2021               Patient Name:  Colton Ford MRN: [id number]  DOB: 12-23-1960 Age / Sex: 60 y.o., male   PCP: Nyoka Cowden, MD         Medical Service: Internal Medicine Teaching Service         Attending Physician: Dr. Oswaldo Done, Marquita Palms, *    First Contact: Adron Bene, MD Pager: [place] [phone]  Second Contact: Quincy Simmonds, MD Pager: Eustaquio Maize [phone]       After Hours (After 5p/  First Contact Pager: [phone]  weekends / holidays): Second Contact Pager: [phone]   SUBJECTIVE   Chief Complaint: Shortness of breath  History of Present Illness:   Colton Ford is a 60 year old male with a PMH of COPD, Asthma, and HTN who is presenting with a 1 week history of shortness of breath. He has inhalers that he uses for his COPD/Asthma but he has been out of his symbicort for about two months. He has been using his albuterol inhaler a lot in the last week. Nothing changed a week ago, he has had no sick contacts, no fevers, chills, cough, or sore throat. He says he has been in his normal state of health other than some shortness of breath, wheezing, and congestion. He is otherwise healthy to his knowledge. He came in the hospital today because he says he could just not catch his breath. He was hospitalized in the past requiring 3 days of intubation for his lung problems but this was about 7 years ago. Says that he really hasn't had to come to the hospital since. Normally it could be 2-3 months where he would not need to use his inhaler at all and he is not sure why it has been bad this past week. He follows with his lung doctor regularly but his PCP retired. Does not use oxygen at baseline  Meds:  No outpatient medications have been marked as taking for the 09/07/21 encounter [facility] Encounter).   No current facility-administered medications on file prior to encounter.   Current Outpatient Medications on File Prior to Encounter  Medication Sig Dispense Refill   albuterol  (PROAIR HFA) 108 (90 Base) MCG/ACT inhaler Inhale 2 puffs into the lungs every 4 (four) hours as needed for wheezing or shortness of breath. 2 puffs every 4 hours as needed only  if your can't catch your breath (Patient taking differently: Inhale 2 puffs into the lungs every 4 (four) hours as needed for wheezing or shortness of breath.) 18 g 11   Budeson-Glycopyrrol-Formoterol (BREZTRI AEROSPHERE) 160-9-4.8 MCG/ACT AERO Inhale 2 puffs into the lungs daily. (Patient not taking: Reported on 09/07/2021) 21.4 g 0   Budeson-Glycopyrrol-Formoterol (BREZTRI AEROSPHERE) 160-9-4.8 MCG/ACT AERO Inhale 2 puffs into the lungs in the morning and at bedtime. (Patient not taking: Reported on 09/07/2021) 3 g 0   Budeson-Glycopyrrol-Formoterol (BREZTRI AEROSPHERE) 160-9-4.8 MCG/ACT AERO Inhale 2 puffs into the lungs in the morning and at bedtime. (Patient not taking: Reported on 09/07/2021) 11.8 g 0   budesonide-formoterol (SYMBICORT) 160-4.5 MCG/ACT inhaler Inhale 2 puffs into the lungs 2 (two) times daily. (Patient not taking: Reported on 09/07/2021) 1 Inhaler 0   budesonide-formoterol (SYMBICORT) 160-4.5 MCG/ACT inhaler Inhale 2 puffs into the lungs 2 (two) times daily. (Patient not taking: Reported on 09/07/2021) 3 Inhaler 3     Past Medical History:  Diagnosis Date   Agitation    Asthma    Mild anemia  No past surgical history on file.  Social:  Lives With: Wife Occupation: Holiday representative PCP: Substances: quit smoking 2-3 weeks ago, previously had about a 50 pack year smoking history  Family History:  COPD in his sister Family History  Problem Relation Age of Onset   Asthma Sister    Heart disease Sister    Heart disease Brother     Allergies: Allergies as of 09/07/2021   (No Known Allergies)    Review of Systems: A complete ROS was negative except as per HPI.   OBJECTIVE:   Physical Exam: Blood pressure (!) 167/85, pulse (!) 102, temperature (!) 97.5 F (36.4 C), temperature source Oral,  resp. rate (!) 28, height 6' (1.829 m), weight 73.5 kg, SpO2 91 %.  Constitutional: middle aged man resting comfortably in bed, in no acute distress Cardiovascular: tachycardic, no m/r/g Pulmonary/Chest: normal work of breathing on 2 L, mild wheezing in the bilateral lung bases Abdominal: soft, non-tender, non-distended MSK: normal bulk and tone Neurological: alert & oriented x 3, answering questions appropriately Skin: warm and dry Psych: normal affect  Labs: CBC    Component Value Date/Time   WBC 9.8 09/07/2021 1432   RBC 6.46 (H) 09/07/2021 1432   HGB 14.4 09/07/2021 1432   HCT 45.5 09/07/2021 1432   PLT 364 09/07/2021 1432   MCV 70.4 (L) 09/07/2021 1432   MCH 22.3 (L) 09/07/2021 1432   MCHC 31.6 09/07/2021 1432   RDW 17.7 (H) 09/07/2021 1432   LYMPHSABS 0.8 09/07/2021 1432   MONOABS 0.4 09/07/2021 1432   EOSABS 0.1 09/07/2021 1432   BASOSABS 0.0 09/07/2021 1432     CMP     Component Value Date/Time   NA 136 09/07/2021 1432   K 3.8 09/07/2021 1432   CL 97 (L) 09/07/2021 1432   CO2 29 09/07/2021 1432   GLUCOSE 150 (H) 09/07/2021 1432   BUN 11 09/07/2021 1432   CREATININE 1.06 09/07/2021 1432   CALCIUM 9.3 09/07/2021 1432   GFRNONAA >60 09/07/2021 1432   GFRAA >60 04/04/2011 0508    Imaging: DG Chest Port 1 View  Result Date: 09/07/2021 CLINICAL DATA:  Respiratory distress EXAM: PORTABLE CHEST 1 VIEW COMPARISON:  January 2020 FINDINGS: The heart size and mediastinal contours are within normal limits. Hyperinflation. Both lungs are clear. No pleural effusion or pneumothorax. The visualized skeletal structures are unremarkable. IMPRESSION: No acute process in the chest. Electronically Signed   By: Guadlupe Spanish M.D.   On: 09/07/2021 15:30    EKG: personally reviewed my interpretation is sinus tachycardia, regular axis regular interval   ASSESSMENT & PLAN:    Assessment & Plan by Problem: Active Problems:   * No active hospital problems. *   Colton Ford  is a 60 y.o. with a PMH of COPD, Asthma, and HTN who is presenting with a 1 week history of shortness of breath  #Asthma/COPD exacerbation in the setting of medication non-adherence #COPD gold class II Patient came in acutely short of breath with new oxygen requirement that improved but did not resolve after breathing treatments and solumedrol. Patient was hypercarbic to 65 with a bicarb of 30.5 but mentating well with only mild wheezing. Low suspicion for infection at this time, WBC normal, patient afebrile with no cough. Suspect patient will not need to stay in the hospital too long and will continue to improve with the breathing treatments and steroids. Will make sure patient has access to inhalers when ready for discharge. - s/p solumedrol; 5 days  of prednisone 40 - duonebs Q4 - albuterol PRN - wean oxygen as able  #HTN Patient has history of hypertension not currently on medications - recommend initiating therapy in the outpatient setting  #Tobacco Use disorder Patient recently quit smoking cigarettes "because I don't want to die". He is interested in starting a medication once he leaves the hospital to help him continue to stop smoking. - discuss medication options and follow up with PCP  Diet: Normal VTE: Enoxaparin IVF: None,None Code: Full  Dispo: Admit patient to Observation with expected length of stay less than 2 midnights.  Signed: Ilene Qua, MD Internal Medicine Resident PGY-1 Pager: [phone]  09/07/2021, 5:11 PM

## 2021-09-07 NOTE — ED Provider Notes (Signed)
[facility] EMERGENCY DEPARTMENT Provider Note   CSN: [id number] Arrival date & time: 09/07/21  1422     History Chief Complaint  Patient presents with   Respiratory Distress    Colton Ford is a 61 y.o. male.  This is a 60 y.o. male with significant medical history as below, including asthma (prior intubation 2/2 asthma) who presents to the ED with complaint of dib. Reports malaise, worsening cough, dib for approx 6 days. No fevers or chills.  Patient feels as though his breathing is gotten progressively worse over the past few days.  He was attempting to ambulate from his vehicle to going to a restaurant this afternoon but was unable to get out of his car secondary to respiratory distress.  Called EMS.  Patient given steroids, nebulized breathing treatments, magnesium, placed on nonrebreather by EMS.  Patient has been using his home inhaler at home without significant improvement of symptoms.  He has been having mild chest discomfort associated with his dyspnea that is worsened with exertion.  He has been having productive cough with clear/sometimes white sputum.  No longer smoking cigarettes.  No home oxygen use.  No recent travel or sick contacts.  He has received COVID-19 immunizations.  The history is provided by the patient. No language interpreter was used.      Past Medical History:  Diagnosis Date   Agitation    Asthma    Mild anemia     Patient Active Problem List   Diagnosis Date Noted   Essential hypertension 04/12/2017   Asthma 01/15/2014   Tobacco use disorder 01/15/2014   COPD exacerbation ([place]) 04/10/2011   Inguinal hernia 03/20/2011    No past surgical history on file.     Family History  Problem Relation Age of Onset   Asthma Sister    Heart disease Sister    Heart disease Brother     Social History   Tobacco Use   Smoking status: Some Days    Packs/day: 0.20    Years: 30.00    Pack years: 6.00    Types: Cigarettes    Smokeless tobacco: Never  Substance Use Topics   Alcohol use: No    Alcohol/week: 0.0 standard drinks   Drug use: No    Home Medications Prior to Admission medications   Medication Sig Start Date End Date Taking? Authorizing Provider  budesonide-formoterol (SYMBICORT) 160-4.5 MCG/ACT inhaler Inhale 2 puffs into the lungs 2 (two) times daily. 09/08/21  Yes [name], [name], MD  Varenicline Tartrate, Starter, (CHANTIX STARTING MONTH PAK) 0.5 MG X 11 & 1 MG X 42 TBPK Take 0.5 mg by mouth daily for 3 days, THEN 0.5 mg 2 (two) times daily for 4 days, THEN 1 mg 2 (two) times daily for 23 days. 09/08/21 10/08/21 Yes [name], MD  Varenicline Tartrate, Starter, (CHANTIX STARTING MONTH PAK) 0.5 MG X 11 & 1 MG X 42 TBPK Take 0.5 mg by mouth daily. Please take 0.5 mg daily for 3 day, then 0.5 mg twice daily for 4 days. Then 1 mg twice daily thereafter 09/08/21  Yes [name], MD  albuterol (PROVENTIL) (2.5 MG/3ML) 0.083% nebulizer solution use 1 vial via nebulizer every 6 (six) hours as needed for wheezing or shortness of breath. 09/08/21   [name], [name], MD  predniSONE (DELTASONE) 20 MG tablet Take 2 tablets (40 mg total) by mouth daily with breakfast for 4 days. 09/09/21 09/13/21  Scarlett Presto, MD    Allergies  Patient has no known allergies.  Review of Systems   Review of Systems  Constitutional:  Positive for fatigue. Negative for chills and fever.  HENT:  Negative for facial swelling and trouble swallowing.   Eyes:  Negative for photophobia and visual disturbance.  Respiratory:  Positive for cough, chest tightness and shortness of breath.   Cardiovascular:  Positive for chest pain. Negative for palpitations.  Gastrointestinal:  Negative for abdominal pain, nausea and vomiting.  Endocrine: Negative for polydipsia and polyuria.  Genitourinary:  Negative for difficulty urinating and hematuria.  Musculoskeletal:  Negative for gait problem and joint swelling.  Skin:  Negative for pallor and  rash.  Neurological:  Negative for syncope and headaches.  Psychiatric/Behavioral:  Negative for agitation and confusion.    Physical Exam Updated Vital Signs BP (!) 121/57   Pulse 92   Temp 98.6 F (37 C)   Resp (!) 24   Ht 6' (1.829 m)   Wt 73.5 kg   SpO2 96%   BMI 21.97 kg/m   Physical Exam Vitals and nursing note reviewed.  Constitutional:      General: He is not in acute distress.    Appearance: He is well-developed.  HENT:     Head: Normocephalic and atraumatic.     Right Ear: External ear normal.     Left Ear: External ear normal.     Mouth/Throat:     Mouth: Mucous membranes are moist.  Eyes:     General: No scleral icterus. Cardiovascular:     Rate and Rhythm: Normal rate and regular rhythm.     Pulses: Normal pulses.     Heart sounds: Normal heart sounds.  Pulmonary:     Effort: Pulmonary effort is normal. Tachypnea present. No respiratory distress.     Breath sounds: Decreased air movement present. Decreased breath sounds and wheezing present.  Abdominal:     General: Abdomen is flat.     Palpations: Abdomen is soft.     Tenderness: There is no abdominal tenderness.  Musculoskeletal:        General: Normal range of motion.     Cervical back: Normal range of motion.     Right lower leg: No edema.     Left lower leg: No edema.  Skin:    General: Skin is warm and dry.     Capillary Refill: Capillary refill takes less than 2 seconds.  Neurological:     Mental Status: He is alert and oriented to person, place, and time.  Psychiatric:        Mood and Affect: Mood normal.        Behavior: Behavior normal.    ED Results / Procedures / Treatments   Labs (all labs ordered are listed, but only abnormal results are displayed) Labs Reviewed  BASIC METABOLIC PANEL - Abnormal; Notable for the following components:      Result Value   Chloride 97 (*)    Glucose, Bld 150 (*)    All other components within normal limits  CBC WITH DIFFERENTIAL/PLATELET -  Abnormal; Notable for the following components:   RBC 6.46 (*)    MCV 70.4 (*)    MCH 22.3 (*)    RDW 17.7 (*)    Neutro Abs 8.3 (*)    All other components within normal limits  BLOOD GAS, VENOUS - Abnormal; Notable for the following components:   pCO2, Ven 65.0 (*)    pO2, Ven <31.0 (*)    Bicarbonate 30.5 (*)  Acid-Base Excess 4.4 (*)    All other components within normal limits  MAGNESIUM - Abnormal; Notable for the following components:   Magnesium 2.9 (*)    All other components within normal limits  CBC - Abnormal; Notable for the following components:   RBC 5.84 (*)    Hemoglobin 12.8 (*)    MCV 69.3 (*)    MCH 21.9 (*)    RDW 16.4 (*)    All other components within normal limits  BASIC METABOLIC PANEL - Abnormal; Notable for the following components:   Sodium 133 (*)    Glucose, Bld 112 (*)    Calcium 8.8 (*)    All other components within normal limits  RESP PANEL BY RT-PCR (FLU A&B, COVID) ARPGX2  BRAIN NATRIURETIC PEPTIDE  D-DIMER, QUANTITATIVE  HIV ANTIBODY (ROUTINE TESTING W REFLEX)  TROPONIN I (HIGH SENSITIVITY)  TROPONIN I (HIGH SENSITIVITY)    EKG EKG Interpretation  Date/Time:  Wednesday September 07 2021 14:25:57 EST Ventricular Rate:  102 PR Interval:  141 QRS Duration: 78 QT Interval:  346 QTC Calculation: 453 R Axis:   80 Text Interpretation: Sinus tachycardia Anterior infarct, old Confirmed by Wynona Dove ([id number]) on 09/07/2021 3:17:58 PM  Radiology DG Chest Port 1 View  Result Date: 09/07/2021 CLINICAL DATA:  Respiratory distress EXAM: PORTABLE CHEST 1 VIEW COMPARISON:  January 2020 FINDINGS: The heart size and mediastinal contours are within normal limits. Hyperinflation. Both lungs are clear. No pleural effusion or pneumothorax. The visualized skeletal structures are unremarkable. IMPRESSION: No acute process in the chest. Electronically Signed   By: Macy Mis M.D.   On: 09/07/2021 15:30    Procedures .Critical Care Performed by:  Jeanell Sparrow, DO Authorized by: Jeanell Sparrow, DO   Critical care provider statement:    Critical care time (minutes):  42   Critical care time was exclusive of:  Separately billable procedures and treating other patients   Critical care was necessary to treat or prevent imminent or life-threatening deterioration of the following conditions:  Respiratory failure   Critical care was time spent personally by me on the following activities:  Development of treatment plan with patient or surrogate, discussions with consultants, evaluation of patient's response to treatment, examination of patient, ordering and review of laboratory studies, ordering and review of radiographic studies, ordering and performing treatments and interventions, pulse oximetry, re-evaluation of patient's condition and review of old charts   Care discussed with: admitting provider     Medications Ordered in ED Medications  ipratropium-albuterol (DUONEB) 0.5-2.5 (3) MG/3ML nebulizer solution 3 mL (3 mLs Nebulization Not Given 09/07/21 1817)  enoxaparin (LOVENOX) injection 40 mg (40 mg Subcutaneous Given 09/07/21 2114)  predniSONE (DELTASONE) tablet 40 mg (40 mg Oral Given 09/08/21 0757)  mometasone-formoterol (DULERA) 200-5 MCG/ACT inhaler 2 puff (0 puffs Inhalation Hold 09/08/21 1039)  ipratropium-albuterol (DUONEB) 0.5-2.5 (3) MG/3ML nebulizer solution 3 mL (3 mLs Nebulization Given 09/08/21 1233)  albuterol (PROVENTIL) (2.5 MG/3ML) 0.083% nebulizer solution 3 mL (3 mLs Inhalation Given 09/08/21 0303)  acetaminophen (TYLENOL) tablet 650 mg (has no administration in time range)  senna (SENOKOT) tablet 8.6 mg (has no administration in time range)  alum & mag hydroxide-simeth (MAALOX/MYLANTA) 200-200-20 MG/5ML suspension 30 mL (30 mLs Oral Given 09/08/21 0428)  magnesium sulfate IVPB 2 g 50 mL (0 g Intravenous Stopped 09/07/21 1703)  methylPREDNISolone sodium succinate (SOLU-MEDROL) 125 mg/2 mL injection 125 mg (125 mg Intravenous  Given 09/07/21 1530)  albuterol (PROVENTIL) (2.5 MG/3ML) 0.083% nebulizer solution  5 mg (5 mg Nebulization Given 09/07/21 1620)    ED Course  I have reviewed the triage vital signs and the nursing notes.  Pertinent labs & imaging results that were available during my care of the patient were reviewed by me and considered in my medical decision making (see chart for details).  Clinical Course as of 09/08/21 1543  Wed Sep 07, 2021  1555 Pt with mild hypoxia on 3L [place], he has no conversational dyspnea and does report feeling better after neb treatment. Given continued requirement for [place] will give further nebs, if not improved would recommend admission for continued observation, neb tx [SG]    Clinical Course User Index [SG] Sloan Leiter, DO   MDM Rules/Calculators/A&P                           CC: dib  This patient complains of dib; this involves an extensive number of treatment options and is a complaint that carries with it a high risk of complications and morbidity. Vital signs were reviewed. Serious etiologies considered.  Diffuse wheezing on exam.  Give further nebulized breathing treatments, another 2 g of mag sulfate.  IV fluids.  Record review:   Previous records obtained and reviewed   Work up as above, notable for:  Labs & imaging results that were available during my care of the patient were reviewed by me and considered in my medical decision making.   I ordered imaging studies which included CXR and I independently visualized and interpreted imaging which showed no acute process  Management: Given multiple nebs, mag, steroids, [place]; he is still hypoxic on 3L [place], will give further nebs.  Reassessment:  Given continued oxygen requirement/ hypoxia recommend admission for acute asthma exacerbation.  Patient and family bedside are agreeable.         This chart was dictated using voice recognition software.  Despite best efforts to proofread,  errors can occur which  can change the documentation meaning.  Final Clinical Impression(s) / ED Diagnoses Final diagnoses:  Moderate persistent asthma with exacerbation  Hypoxia    Rx / DC Orders ED Discharge Orders          Ordered    predniSONE (DELTASONE) 20 MG tablet  Daily with breakfast        09/08/21 0930    albuterol (PROVENTIL) (2.5 MG/3ML) 0.083% nebulizer solution  Every 6 hours PRN        09/08/21 0930    budesonide-formoterol (SYMBICORT) 160-4.5 MCG/ACT inhaler  2 times daily        09/08/21 0930    Increase activity slowly        09/08/21 1308    Diet - low sodium heart healthy        09/08/21 1308    Call MD for:  difficulty breathing, headache or visual disturbances        09/08/21 1308    Varenicline Tartrate, Starter, (CHANTIX STARTING MONTH PAK) 0.5 MG X 11 & 1 MG X 42 TBPK        09/08/21 1314    Varenicline Tartrate, Starter, (CHANTIX STARTING MONTH PAK) 0.5 MG X 11 & 1 MG X 42 TBPK  Daily        09/08/21 1405             Sloan Leiter, DO 09/08/21 1543

## 2021-09-07 NOTE — ED Triage Notes (Addendum)
Pt BIB ems DUE TO RESP DITRESS. Pt was getting hibachi and felt SOB and headed home. Pt did 2 neb tx at home and then called 911. EMS gave 125 of solu-medrol, 2 nebs, and mag. Pt came in NRB. RN placed pt on 2L and pts O2 is at 100%. Pt is in no current distress. Pt axox4. Pt has a hx of asthma

## 2021-09-08 ENCOUNTER — Other Ambulatory Visit (HOSPITAL_COMMUNITY): Payer: Self-pay

## 2021-09-08 DIAGNOSIS — R0902 Hypoxemia: Secondary | ICD-10-CM

## 2021-09-08 DIAGNOSIS — J4541 Moderate persistent asthma with (acute) exacerbation: Secondary | ICD-10-CM

## 2021-09-08 LAB — BASIC METABOLIC PANEL
Anion gap: 6 (ref 5–15)
BUN: 15 mg/dL (ref 8–23)
CO2: 26 mmol/L (ref 22–32)
Calcium: 8.8 mg/dL — ABNORMAL LOW (ref 8.9–10.3)
Chloride: 101 mmol/L (ref 98–111)
Creatinine, Ser: 0.97 mg/dL (ref 0.61–1.24)
GFR, Estimated: >60 mL/min (ref >60)
Glucose, Bld: 112 mg/dL — ABNORMAL HIGH (ref 70–99)
Potassium: 4.2 mmol/L (ref 3.5–5.1)
Sodium: 133 mmol/L — ABNORMAL LOW (ref 135–145)

## 2021-09-08 LAB — CBC
HCT: 40.5 % (ref 39.0–52.0)
Hemoglobin: 12.8 g/dL — ABNORMAL LOW (ref 13.0–17.0)
MCH: 21.9 pg — ABNORMAL LOW (ref 26.0–34.0)
MCHC: 31.6 g/dL (ref 30.0–36.0)
MCV: 69.3 fL — ABNORMAL LOW (ref 80.0–100.0)
Platelets: 308 10*3/uL (ref 150–400)
RBC: 5.84 MIL/uL — ABNORMAL HIGH (ref 4.22–5.81)
RDW: 16.4 % — ABNORMAL HIGH (ref 11.5–15.5)
WBC: 7.2 10*3/uL (ref 4.0–10.5)
nRBC: 0 % (ref 0.0–0.2)

## 2021-09-08 LAB — HIV ANTIBODY (ROUTINE TESTING W REFLEX): HIV Screen 4th Generation wRfx: NONREACTIVE

## 2021-09-08 MED ORDER — PREDNISONE 20 MG PO TABS
40.0000 mg | ORAL_TABLET | Freq: Every day | ORAL | 0 refills | Status: AC
  Filled 2021-09-08: qty 8, 4d supply, fill #0

## 2021-09-08 MED ORDER — BUDESONIDE-FORMOTEROL FUMARATE 160-4.5 MCG/ACT IN AERO
2.0000 | INHALATION_SPRAY | Freq: Two times a day (BID) | RESPIRATORY_TRACT | 12 refills | Status: DC
  Filled 2021-09-08: qty 10.2, 30d supply, fill #0

## 2021-09-08 MED ORDER — CHANTIX STARTING MONTH PAK 0.5 MG X 11 & 1 MG X 42 PO TBPK
ORAL_TABLET | ORAL | 0 refills | Status: AC
  Filled 2021-09-08: qty 53, 30d supply, fill #0

## 2021-09-08 MED ORDER — ALUM & MAG HYDROXIDE-SIMETH 200-200-20 MG/5ML PO SUSP
30.0000 mL | ORAL | Status: DC | PRN
  Administered 2021-09-08: 30 mL via ORAL
  Filled 2021-09-08 (×2): qty 30

## 2021-09-08 MED ORDER — CHANTIX STARTING MONTH PAK 0.5 MG X 11 & 1 MG X 42 PO TBPK: 0.5000 mg | ORAL_TABLET | Freq: Every day | ORAL | 0 refills | Status: DC

## 2021-09-08 MED ORDER — ALBUTEROL SULFATE (2.5 MG/3ML) 0.083% IN NEBU
3.0000 mL | INHALATION_SOLUTION | Freq: Four times a day (QID) | RESPIRATORY_TRACT | 12 refills | Status: DC | PRN
  Filled 2021-09-08: qty 90, 8d supply, fill #0

## 2021-09-08 NOTE — Discharge Summary (Signed)
Name: Colton Ford MRN: [id number] DOB: 09/30/1961 60 y.o. PCP: Nyoka Cowden, MD  Date of Admission: 09/07/2021  2:22 PM Date of Discharge:   09/08/21  Attending Physician: Tyson Alias, *  Discharge Diagnosis: 1. Asthma/COPD exacerbation in the setting of medication non-adherence; COPD gold class II 2. HTN 3. Tobacco Use disorder  Discharge Medications: Allergies as of 09/08/2021   No Known Allergies      Medication List     STOP taking these medications    albuterol 108 (90 Base) MCG/ACT inhaler Commonly known as: ProAir HFA Replaced by: albuterol (2.5 MG/3ML) 0.083% nebulizer solution   Breztri Aerosphere 160-9-4.8 MCG/ACT Aero Generic drug: Budeson-Glycopyrrol-Formoterol       TAKE these medications    albuterol (2.5 MG/3ML) 0.083% nebulizer solution Commonly known as: PROVENTIL use 1 vial via nebulizer every 6 (six) hours as needed for wheezing or shortness of breath. Replaces: albuterol 108 (90 Base) MCG/ACT inhaler   budesonide-formoterol 160-4.5 MCG/ACT inhaler Commonly known as: SYMBICORT Inhale 2 puffs into the lungs 2 (two) times daily. What changed: Another medication with the same name was removed. Continue taking this medication, and follow the directions you see here.   Chantix Starting Month Pak 0.5 MG X 11 & 1 MG X 42 Tbpk Generic drug: Varenicline Tartrate (Starter) Take 0.5 mg by mouth daily for 3 days, THEN 0.5 mg 2 (two) times daily for 4 days, THEN 1 mg 2 (two) times daily for 23 days. Start taking on: September 08, 2021   predniSONE 20 MG tablet Commonly known as: DELTASONE Take 2 tablets (40 mg total) by mouth daily with breakfast for 4 days. Start taking on: September 09, 2021        Disposition and follow-up:   [name] was discharged from [facility] in Stable condition.  At the hospital follow up visit please address:  1.  Asthma/COPD- make sure patient has all of his inhalers. Running  out of medication is likely one of his biggest flare triggers Hypertension: Please evaluate his BP, has a history but not on medication, may need to be started on antihypertensives  2.  Labs / imaging needed at time of follow-up: none  3.  Pending labs/ test needing follow-up: none  Follow-up Appointments:  Follow-up Information     [facility] Follow up on 09/15/2021.   Why: You have an appointment at 2:45 pm with Dr. Gorden Harms Course by problem list: 1. Asthma/COPD exacerbation in the setting of medication non-adherence; COPD gold class II Patient came in acutely short of breath with a new oxygen requirement after a week of shortness of breath and running out of his medications several months go. Patient was given breathing treatments, solumedrol and placed on a course of steroids. By day of discharge patient was saturating well on room air, had less wheezing on exam, and was able to ambulate without desaturations. He was given prescriptions for the inhalers that he had run out of and scheduled for follow up in clinic. 09/15/2021 with Dr. Marchia Bond.  2. HTN Patient has a history of HTN though not currently on treatment. No medications started in the setting of acute illness/inpatient setting but will recommend starting medications in the outpatient setting.  3. Tobacco Use disorder Patient says that he recently quit smoking a 2-3 weeks ago. He is interested in continued cessation. Upon discharge will prescribe him  chantix and have him follow up as an outpatient.   Discharge Exam:   BP (!) 124/113   Pulse 95   Temp 98 F (36.7 C) (Oral)   Resp (!) 23   Ht 6' (1.829 m)   Wt 73.5 kg   SpO2 93%   BMI 21.97 kg/m  Constitutional: middle aged man resting comfortably in bed, in no acute distress  Cardiovascular: tachycardic, no m/r/g Pulmonary/Chest: normal work of breathing on 2 L, mild wheezing in the bilateral lung bases Abdominal: soft, non-tender,  non-distended MSK: normal bulk and tone Neurological: alert & oriented x 3, answering questions appropriately Skin: warm and dry Psych: normal affect  Pertinent Labs, Studies, and Procedures:   DG Chest Port 1 View  Result Date: 09/07/2021 CLINICAL DATA:  Respiratory distress EXAM: PORTABLE CHEST 1 VIEW COMPARISON:  January 2020 FINDINGS: The heart size and mediastinal contours are within normal limits. Hyperinflation. Both lungs are clear. No pleural effusion or pneumothorax. The visualized skeletal structures are unremarkable. IMPRESSION: No acute process in the chest. Electronically Signed   By: Macy Mis M.D.   On: 09/07/2021 15:30    CBC Latest Ref Rng & Units 09/08/2021 09/07/2021 11/17/2011  WBC 4.0 - 10.5 K/uL 7.2 9.8 -  Hemoglobin 13.0 - 17.0 g/dL 12.8(L) 14.4 17.0  Hematocrit 39.0 - 52.0 % 40.5 45.5 50.0  Platelets 150 - 400 K/uL 308 364 -    BMP Latest Ref Rng & Units 09/08/2021 09/07/2021 11/17/2011  Glucose 70 - 99 mg/dL 112(H) 150(H) 103(H)  BUN 8 - 23 mg/dL 15 11 21   Creatinine 0.61 - 1.24 mg/dL 0.97 1.06 1.50(H)  Sodium 135 - 145 mmol/L 133(L) 136 138  Potassium 3.5 - 5.1 mmol/L 4.2 3.8 4.1  Chloride 98 - 111 mmol/L 101 97(L) 104  CO2 22 - 32 mmol/L 26 29 -  Calcium 8.9 - 10.3 mg/dL 8.8(L) 9.3 -    Discharge Instructions: Discharge Instructions     Call MD for:  difficulty breathing, headache or visual disturbances   Complete by: As directed    Diet - low sodium heart healthy   Complete by: As directed    Increase activity slowly   Complete by: As directed        Signed: [name], MD 09/08/2021, 1:16 PM   Pager: [phone]

## 2021-09-08 NOTE — Progress Notes (Incomplete)
HD#0 Subjective:  Overnight Events: ***   ***  Objective:  Vital signs in last 24 hours: Vitals:   09/08/21 0530 09/08/21 0545 09/08/21 0600 09/08/21 0615  BP: (!) 140/91 134/86 132/81 133/76  Pulse: 89 79 75 75  Resp: 15 16 17 18   Temp:      TempSrc:      SpO2: 100% 100% 100% 100%  Weight:      Height:       Supplemental O2: Room Air SpO2: 100 % O2 Flow Rate (L/min): 2 L/min   Physical Exam:  Constitutional: middle aged man resting comfortably in bed, in no acute distress *** Cardiovascular: tachycardic, no m/r/g Pulmonary/Chest: normal work of breathing on 2 L, mild wheezing in the bilateral lung bases Abdominal: soft, non-tender, non-distended MSK: normal bulk and tone Neurological: alert & oriented x 3, answering questions appropriately Skin: warm and dry Psych: normal affect    Filed Weights   09/07/21 1426  Weight: 73.5 kg     Intake/Output Summary (Last 24 hours) at 09/08/2021 0700 Last data filed at 09/08/2021 0419 Gross per 24 hour  Intake --  Output 500 ml  Net -500 ml   Net IO Since Admission: -500 mL [09/08/21 0700]  Pertinent Labs: CBC Latest Ref Rng & Units 09/08/2021 09/07/2021 11/17/2011  WBC 4.0 - 10.5 K/uL 7.2 9.8 -  Hemoglobin 13.0 - 17.0 g/dL 12.8(L) 14.4 17.0  Hematocrit 39.0 - 52.0 % 40.5 45.5 50.0  Platelets 150 - 400 K/uL 308 364 -    CMP Latest Ref Rng & Units 09/08/2021 09/07/2021 11/17/2011  Glucose 70 - 99 mg/dL 112(H) 150(H) 103(H)  BUN 8 - 23 mg/dL 15 11 21   Creatinine 0.61 - 1.24 mg/dL 0.97 1.06 1.50(H)  Sodium 135 - 145 mmol/L 133(L) 136 138  Potassium 3.5 - 5.1 mmol/L 4.2 3.8 4.1  Chloride 98 - 111 mmol/L 101 97(L) 104  CO2 22 - 32 mmol/L 26 29 -  Calcium 8.9 - 10.3 mg/dL 8.8(L) 9.3 -    Imaging: DG Chest Port 1 View  Result Date: 09/07/2021 CLINICAL DATA:  Respiratory distress EXAM: PORTABLE CHEST 1 VIEW COMPARISON:  January 2020 FINDINGS: The heart size and mediastinal contours are within normal limits.  Hyperinflation. Both lungs are clear. No pleural effusion or pneumothorax. The visualized skeletal structures are unremarkable. IMPRESSION: No acute process in the chest. Electronically Signed   By: Macy Mis M.D.   On: 09/07/2021 15:30    Assessment/Plan:   Active Problems:   * No active hospital problems. *   Patient Summary: HERVEY ESCARENO is a 60 y.o. with a PMH of COPD, Asthma, and HTN who is presenting with a 1 week history of shortness of breath   #Asthma/COPD exacerbation in the setting of medication non-adherence #COPD gold class II Patient's oxygen saturations have improved overnight and he is not requiring supplemental oxygen at rest. Will check an ambulatory pulse ox. Suspect patient may be able to go home today with inhalers and good follow up. *** - s/p solumedrol; 5 days of prednisone 40 - duonebs Q4 - albuterol PRN - wean oxygen as able   #HTN Patient has history of hypertension not currently on medications - recommend initiating therapy in the outpatient setting   #Tobacco Use disorder Patient recently quit smoking cigarettes "because I don't want to die". He is interested in starting a medication once he leaves the hospital to help him continue to stop smoking. - discuss medication options and follow up  with PCP  Ilene Qua, MD Internal Medicine Resident PGY-1 Pager [phone] Please contact the on call pager after 5 pm and on weekends at [phone].

## 2021-09-08 NOTE — Discharge Instructions (Addendum)
Colton Ford  You were recently admitted to [facility] for shortness of breath due to your asthma/COPD and running out of your medications. We gave you breathing treatments as well as supplemental oxygen while you were in the hospital until you were breathing better and feeling less short of breath.  Continue taking your home medications with the following changes  Continue taking predisone 40 mg for a total of 5 days Continue using your albuterol inhaler every 4-6 hours as needed for shortness of breath Continue using your symbicort inhaler twice a day Please start chantix to help with smoking craving. You will take 0.5 mg daily for 3 days, then 0.5 mg twice daily for 4 days, and then 1 mg twice daily. You will likely need this for at least 12 weeks   You should seek further medical care if you develop worsening shortness of breath, fevers, chills, or develop a cough with a lot of mucous production.  We recommend that you see your primary care doctor in about a week to make sure that you continue to improve. We have made an appointment on [facility] at 2:45 where you can establish care. We are so glad that you are feeling better.  Sincerely, Ilene Qua, MD

## 2021-09-08 NOTE — Progress Notes (Signed)
   09/08/21 1042  TOC ED Mini Assessment  TOC Time spent with patient (minutes): 60  PING Used in TOC Assessment No  Admission or Readmission Diverted Yes  Interventions which prevented an admission or readmission Other (must enter comment) (medication assistance)  What brought you to the Emergency Department?  "I had a hard time breathing"  Barriers to Discharge ED Medication assistance  Barrier interventions MATCH with override  Means of departure Car

## 2021-09-08 NOTE — ED Notes (Signed)
Pt ambulated around room. Oxygen saturation remained between 89 and 95 %

## 2021-09-15 ENCOUNTER — Encounter: Payer: Self-pay | Admitting: Internal Medicine

## 2021-09-15 ENCOUNTER — Telehealth: Payer: Self-pay | Admitting: *Deleted

## 2022-02-13 ENCOUNTER — Emergency Department (HOSPITAL_COMMUNITY)
Admission: EM | Admit: 2022-02-13 | Discharge: 2022-02-13 | Disposition: A | Discharge status: Home / Self Care | Payer: Self-pay | Attending: Emergency Medicine | Admitting: Emergency Medicine

## 2022-02-13 ENCOUNTER — Emergency Department (HOSPITAL_COMMUNITY): Payer: Self-pay

## 2022-02-13 DIAGNOSIS — Z20822 Contact with and (suspected) exposure to covid-19: Secondary | ICD-10-CM | POA: Insufficient documentation

## 2022-02-13 DIAGNOSIS — J45901 Unspecified asthma with (acute) exacerbation: Secondary | ICD-10-CM

## 2022-02-13 DIAGNOSIS — Z7951 Long term (current) use of inhaled steroids: Secondary | ICD-10-CM | POA: Insufficient documentation

## 2022-02-13 DIAGNOSIS — R0602 Shortness of breath: Secondary | ICD-10-CM | POA: Insufficient documentation

## 2022-02-13 DIAGNOSIS — J45909 Unspecified asthma, uncomplicated: Secondary | ICD-10-CM | POA: Insufficient documentation

## 2022-02-13 DIAGNOSIS — R000 Tachycardia, unspecified: Secondary | ICD-10-CM | POA: Insufficient documentation

## 2022-02-13 LAB — COMPREHENSIVE METABOLIC PANEL
ALT: 16 U/L (ref 0–44)
AST: 23 U/L (ref 15–41)
Albumin: 3.8 g/dL (ref 3.5–5.0)
Alkaline Phosphatase: 52 U/L (ref 38–126)
Anion gap: 10 (ref 5–15)
BUN: 11 mg/dL (ref 8–23)
CO2: 26 mmol/L (ref 22–32)
Calcium: 8.9 mg/dL (ref 8.9–10.3)
Chloride: 101 mmol/L (ref 98–111)
Creatinine, Ser: 1.02 mg/dL (ref 0.61–1.24)
GFR, Estimated: >60 mL/min (ref >60)
Glucose, Bld: 167 mg/dL — ABNORMAL HIGH (ref 70–99)
Potassium: 4 mmol/L (ref 3.5–5.1)
Sodium: 137 mmol/L (ref 135–145)
Total Bilirubin: 0.4 mg/dL (ref 0.3–1.2)
Total Protein: 7 g/dL (ref 6.5–8.1)

## 2022-02-13 LAB — BRAIN NATRIURETIC PEPTIDE: B Natriuretic Peptide: 62.7 pg/mL (ref 0.0–100.0)

## 2022-02-13 LAB — CBC WITH DIFFERENTIAL/PLATELET
Abs Immature Granulocytes: 0.03 10*3/uL (ref 0.00–0.07)
Basophils Absolute: 0 10*3/uL (ref 0.0–0.1)
Basophils Relative: 1 %
Eosinophils Absolute: 0.1 10*3/uL (ref 0.0–0.5)
Eosinophils Relative: 1 %
HCT: 41.5 % (ref 39.0–52.0)
Hemoglobin: 13.3 g/dL (ref 13.0–17.0)
Immature Granulocytes: 0 %
Lymphocytes Relative: 10 %
Lymphs Abs: 0.8 10*3/uL (ref 0.7–4.0)
MCH: 22.4 pg — ABNORMAL LOW (ref 26.0–34.0)
MCHC: 32 g/dL (ref 30.0–36.0)
MCV: 69.7 fL — ABNORMAL LOW (ref 80.0–100.0)
Monocytes Absolute: 0.5 10*3/uL (ref 0.1–1.0)
Monocytes Relative: 6 %
Neutro Abs: 6.6 10*3/uL (ref 1.7–7.7)
Neutrophils Relative %: 82 %
Platelets: 362 10*3/uL (ref 150–400)
RBC: 5.95 MIL/uL — ABNORMAL HIGH (ref 4.22–5.81)
RDW: 17.2 % — ABNORMAL HIGH (ref 11.5–15.5)
WBC: 8 10*3/uL (ref 4.0–10.5)
nRBC: 0 % (ref 0.0–0.2)

## 2022-02-13 LAB — RESP PANEL BY RT-PCR (FLU A&B, COVID) ARPGX2
Influenza A by PCR: NEGATIVE
Influenza B by PCR: NEGATIVE
SARS Coronavirus 2 by RT PCR: NEGATIVE

## 2022-02-13 MED ORDER — METHYLPREDNISOLONE SODIUM SUCC 125 MG IJ SOLR
125.0000 mg | INTRAMUSCULAR | Status: AC
  Administered 2022-02-13: 125 mg via INTRAVENOUS
  Filled 2022-02-13: qty 2

## 2022-02-13 MED ORDER — PREDNISONE 10 MG (21) PO TBPK: ORAL_TABLET | Freq: Every day | ORAL | 0 refills | Status: DC

## 2022-02-13 MED ORDER — ALBUTEROL SULFATE (2.5 MG/3ML) 0.083% IN NEBU
10.0000 mg/h | INHALATION_SOLUTION | Freq: Once | RESPIRATORY_TRACT | Status: AC
  Administered 2022-02-13: 10 mg/h via RESPIRATORY_TRACT
  Filled 2022-02-13: qty 12

## 2022-02-13 MED ORDER — IPRATROPIUM BROMIDE HFA 17 MCG/ACT IN AERS
2.0000 | INHALATION_SPRAY | Freq: Once | RESPIRATORY_TRACT | Status: AC
  Administered 2022-02-13: 2 via RESPIRATORY_TRACT
  Filled 2022-02-13: qty 12.9

## 2022-02-13 MED ORDER — ALBUTEROL SULFATE HFA 108 (90 BASE) MCG/ACT IN AERS
2.0000 | INHALATION_SPRAY | RESPIRATORY_TRACT | Status: DC
  Administered 2022-02-13: 2 via RESPIRATORY_TRACT
  Filled 2022-02-13: qty 6.7

## 2022-02-13 MED ORDER — ALBUTEROL SULFATE HFA 108 (90 BASE) MCG/ACT IN AERS
1.0000 | INHALATION_SPRAY | Freq: Four times a day (QID) | RESPIRATORY_TRACT | 2 refills | Status: DC | PRN
Start: 2022-02-13 — End: 2022-03-16

## 2022-02-13 MED ORDER — LACTATED RINGERS IV SOLN: INTRAVENOUS | Status: DC

## 2022-02-13 NOTE — ED Triage Notes (Signed)
Ems brings pt in from home. Pt reports difficulty breathing for about 1 week but worse today. Denies sick contacts. ?

## 2022-02-13 NOTE — ED Provider Notes (Addendum)
?[facility]-EMERGENCY DEPT ?Provider Note ? ? ?CSN: [id number] ?Arrival date & time: 02/13/22  1356 ? ?  ? ?History ? ?Chief Complaint  ?Patient presents with  ? Respiratory Distress  ? ? ?Colton Ford is a 61 y.o. male. ? ?61 year old male with history of asthma presents with shortness of breath times several days.  Has had nonproductive cough without fever or chills.  Using his home nebulizer with limited relief.  Denies any vomiting or diarrhea.  Called EMS was given an albuterol treatment, oxygen and feels much better at this time.  Patient does not normally require oxygen at home ? ? ?  ? ?Home Medications ?Prior to Admission medications   ?Medication Sig Start Date End Date Taking? Authorizing Provider  ?albuterol (PROVENTIL) (2.5 MG/3ML) 0.083% nebulizer solution use 1 vial via nebulizer every 6 (six) hours as needed for wheezing or shortness of breath. 09/08/21   [name], Tyrone Apple, MD  ?budesonide-formoterol (SYMBICORT) 160-4.5 MCG/ACT inhaler Inhale 2 puffs into the lungs 2 (two) times daily. 09/08/21   Ilene Qua, MD  ?Varenicline Tartrate, Starter, (CHANTIX STARTING MONTH PAK) 0.5 MG X 11 & 1 MG X 42 TBPK Take 0.5 mg by mouth daily. Please take 0.5 mg daily for 3 day, then 0.5 mg twice daily for 4 days. Then 1 mg twice daily thereafter 09/08/21   Quincy Simmonds, MD  ?   ? ?Allergies    ?Patient has no known allergies.   ? ?Review of Systems   ?Review of Systems  ?All other systems reviewed and are negative. ? ?Physical Exam ?Updated Vital Signs ?BP (!) 199/126 (BP Location: Right Arm)   Pulse (!) 120   Temp 97.9 ?F (36.6 ?C) (Axillary)   Resp (!) 27   Ht 1.829 m (6')   Wt 65.8 kg   SpO2 100%   BMI 19.67 kg/m?  ?Physical Exam ?Vitals and nursing note reviewed.  ?Constitutional:   ?   General: He is not in acute distress. ?   Appearance: Normal appearance. He is well-developed. He is not toxic-appearing.  ?HENT:  ?   Head: Normocephalic and atraumatic.  ?Eyes:  ?   General: Lids are  normal.  ?   Conjunctiva/sclera: Conjunctivae normal.  ?   Pupils: Pupils are equal, round, and reactive to light.  ?Neck:  ?   Thyroid: No thyroid mass.  ?   Trachea: No tracheal deviation.  ?Cardiovascular:  ?   Rate and Rhythm: Regular rhythm. Tachycardia present.  ?   Heart sounds: Normal heart sounds. No murmur heard. ?  No gallop.  ?Pulmonary:  ?   Effort: Tachypnea present. No respiratory distress.  ?   Breath sounds: No stridor. Decreased breath sounds and wheezing present. No rhonchi or rales.  ?Abdominal:  ?   General: There is no distension.  ?   Palpations: Abdomen is soft.  ?   Tenderness: There is no abdominal tenderness. There is no rebound.  ?Musculoskeletal:     ?   General: No tenderness. Normal range of motion.  ?   Cervical back: Normal range of motion and neck supple.  ?Skin: ?   General: Skin is warm and dry.  ?   Findings: No abrasion or rash.  ?Neurological:  ?   Mental Status: He is alert and oriented to person, place, and time. Mental status is at baseline.  ?   GCS: GCS eye subscore is 4. GCS verbal subscore is 5. GCS motor subscore is 6.  ?  Cranial Nerves: No cranial nerve deficit.  ?   Sensory: No sensory deficit.  ?   Motor: Motor function is intact.  ?Psychiatric:     ?   Attention and Perception: Attention normal.     ?   Speech: Speech normal.     ?   Behavior: Behavior normal.  ? ? ?ED Results / Procedures / Treatments   ?Labs ?(all labs ordered are listed, but only abnormal results are displayed) ?Labs Reviewed  ?RESP PANEL BY RT-PCR (FLU A&B, COVID) ARPGX2  ?BRAIN NATRIURETIC PEPTIDE  ?CBC WITH DIFFERENTIAL/PLATELET  ?COMPREHENSIVE METABOLIC PANEL  ? ? ?EKG ?None ? ?Radiology ?No results found. ? ?Procedures ?Procedures  ? ? ?Medications Ordered in ED ?Medications  ?lactated ringers infusion (has no administration in time range)  ?albuterol (PROVENTIL) (2.5 MG/3ML) 0.083% nebulizer solution (has no administration in time range)  ?methylPREDNISolone sodium succinate (SOLU-MEDROL)  125 mg/2 mL injection 125 mg (has no administration in time range)  ? ? ?ED Course/ Medical Decision Making/ A&P ?  ?                        ?Medical Decision Making ?Amount and/or Complexity of Data Reviewed ?Labs: ordered. ?Radiology: ordered. ?ECG/medicine tests: ordered. ? ?Risk ?Prescription drug management. ? ? ?Patient presented with symptoms concerning for asthma exacerbation.  Had wheezing noted here.  Given albuterol treatments x2 with improvement of his symptoms.  Also medicated with Solu-Medrol as well.  Chest x-ray per my interpretation shows no acute findings.  Consider possibility of CHF but BNP was normal here.  Considered pneumonia but chest x-ray is negative.  Considered PE but feel less likely.  Patient sitting in bed eating a meal and feels well enough to go home.  Wife is at bedside and agrees with this.  No indication for admission at this time ? ?CRITICAL CARE ?Performed by: Toy Baker ?Total critical care time: 50 minutes ?Critical care time was exclusive of separately billable procedures and treating other patients. ?Critical care was necessary to treat or prevent imminent or life-threatening deterioration. ?Critical care was time spent personally by me on the following activities: development of treatment plan with patient and/or surrogate as well as nursing, discussions with consultants, evaluation of patient's response to treatment, examination of patient, obtaining history from patient or surrogate, ordering and performing treatments and interventions, ordering and review of laboratory studies, ordering and review of radiographic studies, pulse oximetry and re-evaluation of patient's condition. ? ? ? ? ? ? ? ? ?Final Clinical Impression(s) / ED Diagnoses ?Final diagnoses:  ?None  ? ? ?Rx / DC Orders ?ED Discharge Orders   ? ? None  ? ?  ? ? ?  ?Lorre Nick, MD ?02/13/22 1740 ? ?  ?Lorre Nick, MD ?02/13/22 1740 ? ?

## 2022-03-16 ENCOUNTER — Encounter: Payer: Self-pay | Admitting: Nurse Practitioner

## 2022-03-16 ENCOUNTER — Ambulatory Visit (INDEPENDENT_AMBULATORY_CARE_PROVIDER_SITE_OTHER): Payer: Self-pay | Admitting: Nurse Practitioner

## 2022-03-16 VITALS — BP 150/80 | HR 99 | Temp 98.2°F | Ht 72.0 in | Wt 147.2 lb

## 2022-03-16 DIAGNOSIS — J4550 Severe persistent asthma, uncomplicated: Secondary | ICD-10-CM | POA: Insufficient documentation

## 2022-03-16 DIAGNOSIS — F17200 Nicotine dependence, unspecified, uncomplicated: Secondary | ICD-10-CM

## 2022-03-16 MED ORDER — BREZTRI AEROSPHERE 160-9-4.8 MCG/ACT IN AERO: 2.0000 | INHALATION_SPRAY | Freq: Two times a day (BID) | RESPIRATORY_TRACT | 0 refills | Status: DC

## 2022-03-16 MED ORDER — ALBUTEROL SULFATE HFA 108 (90 BASE) MCG/ACT IN AERS: 2.0000 | INHALATION_SPRAY | Freq: Four times a day (QID) | RESPIRATORY_TRACT | 2 refills | Status: DC | PRN

## 2022-03-16 NOTE — Patient Instructions (Addendum)
Stop Symbicort. Start Breztri 2 puffs Twice daily. Brush tongue and rinse mouth afterwards  Continue Albuterol inhaler 2 puffs or 3 mL neb every 6 hours as needed for shortness of breath or wheezing. Notify if symptoms persist despite rescue inhaler/neb use.  Complete patient assistance applications and return   Follow up in 3 months with Dr. Sherene Sires. If symptoms do not improve or worsen, please contact office for sooner follow up or seek emergency care.

## 2022-03-16 NOTE — Assessment & Plan Note (Signed)
Smoking cessation encouraged. He has cut back and is trying to quit. Unable to afford pharmacologic therapy at this time. Quit Now line recommended.

## 2022-03-16 NOTE — Assessment & Plan Note (Addendum)
Stable today without bronchospasm on exam. Recommended holding off on oral steroids. Step up to triple therapy regimen given recent exacerbations and COPD overlap. Provided with samples of Breztri today and [employer] paperwork for patient assistance. I refilled his albuterol and provided him with GoodRx coupon so he could get this filled at an affordable price.    I also provided him with [facility] Patient Assistance application which he is supposed to complete and return.  Patient Instructions  Stop Symbicort. Start Breztri 2 puffs Twice daily. Brush tongue and rinse mouth afterwards  Continue Albuterol inhaler 2 puffs or 3 mL neb every 6 hours as needed for shortness of breath or wheezing. Notify if symptoms persist despite rescue inhaler/neb use.  Complete patient assistance applications and return   Follow up in 3 months with Dr. Sherene Sires. If symptoms do not improve or worsen, please contact office for sooner follow up or seek emergency care.

## 2022-03-16 NOTE — Progress Notes (Signed)
@Patient  ID: Colton Ford, male    DOB: 12-Aug-1961, 61 y.o.   MRN: [id number]  Chief Complaint  Patient presents with   Follow-up    Follow up. Patient says he's been wheezing a little this morning.     Referring provider: Tanda Rockers, MD  HPI: 61 year old male, current smoker followed for asthma/COPD overlap. He is a patient of Dr. Gustavus Bryant and last seen in office 12/01/2019. Past medical history significant for HTN and GERD.  TEST/EVENTS:  05/02/2012 PFTs: FVC 64, FEV1 42, ratio 51, DLCO 67. Severe obstruction with significant bronchodilator response (34% change)  12/01/2019: OV with Dr. Melvyn Novas. Breathing doing well as long as using Symbicort. Rare use of SABA. Has difficulties with cost of meds so is sometimes without Symbicort.   03/16/2022: Today-follow-up Patient presents today for overdue follow-up.  He has been hospitalized twice over the last 6 months for asthma exacerbation, which usually correspond to him running out of his inhalers. He has trouble affording these as he doesn't have insurance and he has been out of them for the last two weeks. He feels like his breathing has been okay over the last few weeks, not great, and he has had some occasional wheezing. Denies cough, hemoptysis, weight loss, anorexia, lower extremity edema. He does continue to smoke daily but has cut back and is only smoking one or two a day.  No Known Allergies  Immunization History  Administered Date(s) Administered   Pneumococcal Conjugate-13 05/26/2014    Past Medical History:  Diagnosis Date   Agitation    Asthma    Mild anemia     Tobacco History: Social History   Tobacco Use  Smoking Status Some Days   Packs/day: 0.20   Years: 30.00   Total pack years: 6.00   Types: Cigarettes  Smokeless Tobacco Never   Ready to quit: Not Answered Counseling given: Not Answered   Outpatient Medications Prior to Visit  Medication Sig Dispense Refill   albuterol (PROVENTIL) (2.5 MG/3ML) 0.083%  nebulizer solution use 1 vial via nebulizer every 6 (six) hours as needed for wheezing or shortness of breath. 90 mL 12   predniSONE (STERAPRED UNI-PAK 21 TAB) 10 MG (21) TBPK tablet Take by mouth daily. Take 6 tabs by mouth daily  for 2 days, then 5 tabs for 2 days, then 4 tabs for 2 days, then 3 tabs for 2 days, 2 tabs for 2 days, then 1 tab by mouth daily for 2 days 42 tablet 0   raNITIdine HCl (ACID REDUCER PO) Take 1 tablet by mouth daily as needed (acid reflux).     albuterol (VENTOLIN HFA) 108 (90 Base) MCG/ACT inhaler Inhale 1-2 puffs into the lungs every 6 (six) hours as needed for wheezing or shortness of breath. 1 each 2   budesonide-formoterol (SYMBICORT) 160-4.5 MCG/ACT inhaler Inhale 2 puffs into the lungs 2 (two) times daily. 10.2 g 12   Varenicline Tartrate, Starter, (CHANTIX STARTING MONTH PAK) 0.5 MG X 11 & 1 MG X 42 TBPK Take 0.5 mg by mouth daily. Please take 0.5 mg daily for 3 day, then 0.5 mg twice daily for 4 days. Then 1 mg twice daily thereafter (Patient not taking: Reported on 02/13/2022) 53 each 0   No facility-administered medications prior to visit.     Review of Systems:   Constitutional: No weight loss or gain, night sweats, fevers, chills, fatigue, or lassitude. HEENT: No headaches, difficulty swallowing, tooth/dental problems, or sore throat. No sneezing, itching,  ear ache, nasal congestion, or post nasal drip CV:  No chest pain, orthopnea, PND, swelling in lower extremities, anasarca, dizziness, palpitations, syncope Resp: +shortness of breath with exertion; occasional wheeze. No excess mucus or change in color of mucus. No productive or non-productive. No hemoptysis. No chest wall deformity Skin: No rash, lesions, ulcerations MSK:  No joint pain or swelling.  No decreased range of motion.  No back pain. Neuro: No dizziness or lightheadedness.  Psych: No depression or anxiety. Mood stable.     Physical Exam:  BP (!) 150/80 (BP Location: Right Arm, Patient  Position: Sitting, Cuff Size: Normal)   Pulse 99   Temp 98.2 F (36.8 C) (Oral)   Ht 6' (1.829 m)   Wt 147 lb 3.2 oz (66.8 kg)   SpO2 97%   BMI 19.96 kg/m   GEN: Pleasant, interactive, well-appearing; in no acute distress. HEENT:  Normocephalic and atraumatic. PERRLA. Sclera white. Nasal turbinates pink, moist and patent bilaterally. No rhinorrhea present. Oropharynx pink and moist, without exudate or edema. No lesions, ulcerations, or postnasal drip.  NECK:  Supple w/ fair ROM. No JVD present. Normal carotid impulses w/o bruits. Thyroid symmetrical with no goiter or nodules palpated. No lymphadenopathy.   CV: RRR, no m/r/g, no peripheral edema. Pulses intact, +2 bilaterally. No cyanosis, pallor or clubbing. PULMONARY:  Unlabored, regular breathing. Clear bilaterally A&P w/o wheezes/rales/rhonchi. No accessory muscle use. No dullness to percussion. GI: BS present and normoactive. Soft, non-tender to palpation. No organomegaly or masses detected. No CVA tenderness. MSK: No erythema, warmth or tenderness. Cap refil <2 sec all extrem. No deformities or joint swelling noted.  Neuro: A/Ox3. No focal deficits noted.   Skin: Warm, no lesions or rashe Psych: Normal affect and behavior. Judgement and thought content appropriate.     Lab Results:  CBC    Component Value Date/Time   WBC 8.0 02/13/2022 1646   RBC 5.95 (H) 02/13/2022 1646   HGB 13.3 02/13/2022 1646   HCT 41.5 02/13/2022 1646   PLT 362 02/13/2022 1646   MCV 69.7 (L) 02/13/2022 1646   MCH 22.4 (L) 02/13/2022 1646   MCHC 32.0 02/13/2022 1646   RDW 17.2 (H) 02/13/2022 1646   LYMPHSABS 0.8 02/13/2022 1646   MONOABS 0.5 02/13/2022 1646   EOSABS 0.1 02/13/2022 1646   BASOSABS 0.0 02/13/2022 1646    BMET    Component Value Date/Time   NA 137 02/13/2022 1646   K 4.0 02/13/2022 1646   CL 101 02/13/2022 1646   CO2 26 02/13/2022 1646   GLUCOSE 167 (H) 02/13/2022 1646   BUN 11 02/13/2022 1646   CREATININE 1.02 02/13/2022  1646   CALCIUM 8.9 02/13/2022 1646   GFRNONAA >60 02/13/2022 1646   GFRAA >60 04/04/2011 0508    BNP    Component Value Date/Time   BNP 62.7 02/13/2022 1646     Imaging:  No results found.        No data to display          No results found for: "NITRICOXIDE"      Assessment & Plan:   Poorly controlled severe persistent asthma without complication Stable today without bronchospasm on exam. Recommended holding off on oral steroids. Step up to triple therapy regimen given recent exacerbations and COPD overlap. Provided with samples of Breztri today and [employer] paperwork for patient assistance. I refilled his albuterol and provided him with GoodRx coupon so he could get this filled at an affordable price.    I  also provided him with [facility] Patient Assistance application which he is supposed to complete and return.  Patient Instructions  Stop Symbicort. Start Breztri 2 puffs Twice daily. Brush tongue and rinse mouth afterwards  Continue Albuterol inhaler 2 puffs or 3 mL neb every 6 hours as needed for shortness of breath or wheezing. Notify if symptoms persist despite rescue inhaler/neb use.  Complete patient assistance applications and return   Follow up in 3 months with Dr. Sherene Sires. If symptoms do not improve or worsen, please contact office for sooner follow up or seek emergency care.     Tobacco use disorder Smoking cessation encouraged. He has cut back and is trying to quit. Unable to afford pharmacologic therapy at this time. Quit Now line recommended.    I spent 32 minutes of dedicated to the care of this patient on the date of this encounter to include pre-visit review of records, face-to-face time with the patient discussing conditions above, post visit ordering of testing, clinical documentation with the electronic health record, making appropriate referrals as documented, and communicating necessary findings to members of the patients care team.  Noemi Chapel, NP 03/16/2022  Pt aware and understands NP's role.

## 2022-05-01 ENCOUNTER — Other Ambulatory Visit (HOSPITAL_COMMUNITY): Payer: Self-pay

## 2022-06-13 ENCOUNTER — Telehealth: Payer: Self-pay | Admitting: Nurse Practitioner

## 2022-06-13 NOTE — Telephone Encounter (Signed)
Patient called to request some samples of his medication, Ventolin or Symbicort.  He stated that he is all out of the medication and he does not have any money or insurance to get a refill.  Please advise.

## 2022-06-13 NOTE — Telephone Encounter (Signed)
ATC x2.  LVM to return call.  According to last OV, Florentina Addison stated: Patient Instructions  Stop Symbicort. Start Breztri 2 puffs Twice daily.

## 2022-06-13 NOTE — Telephone Encounter (Signed)
We do not carry samples of either Ventolin or Symbicort. Attempted to call pt to let him know this but unable to reach. Left message for him to return call.

## 2022-06-14 ENCOUNTER — Other Ambulatory Visit: Payer: Self-pay

## 2022-06-14 MED ORDER — ALBUTEROL SULFATE HFA 108 (90 BASE) MCG/ACT IN AERS: 2.0000 | INHALATION_SPRAY | Freq: Four times a day (QID) | RESPIRATORY_TRACT | 2 refills | Status: DC | PRN

## 2022-06-14 MED ORDER — FLUTICASONE FUROATE-VILANTEROL 100-25 MCG/ACT IN AEPB: 1.0000 | INHALATION_SPRAY | Freq: Every day | RESPIRATORY_TRACT | 0 refills | Status: DC

## 2022-06-14 NOTE — Telephone Encounter (Signed)
Called and spoke with patient, he states that he had to go to the ED yesterday, he was given a breathing treatment and was some better.  He did not have an albuterol inhaler, however did have albuterol nebulizer solution.  I advised him that I would have 2 samples put up front for him to pick up while he is awaiting patient assistance (he is trying to get a copy of his W-2 for patient assistance).  He said he could not afford the albuterol inhaler.  I found that he could get an albuterol inhaler at [facility] for $17.44 using Good Rx.  I advised him I would send the Good Rx coupon to the [facility] on [address] and [address].  He verbalized understanding.

## 2022-06-14 NOTE — Telephone Encounter (Signed)
Patient is calling again regarding samples.  He stated that someone had called but he missed the call.  Patient would like a call back

## 2022-06-14 NOTE — Progress Notes (Signed)
Breztri sample

## 2022-06-14 NOTE — Telephone Encounter (Signed)
Patient called back- states he was not taking Breztri- he has not taken anything for a while due to trying to get assistance. States he had to go to the hospital yesterday. Please advise if patient can get Breztri samples.

## 2022-06-14 NOTE — Telephone Encounter (Signed)
Patient calling back regarding samples- states he does not want to end up in the hospital, so he really needs medication.

## 2022-08-07 ENCOUNTER — Other Ambulatory Visit: Payer: Self-pay

## 2022-08-07 ENCOUNTER — Encounter (HOSPITAL_COMMUNITY): Payer: Self-pay | Admitting: Radiology

## 2022-08-07 ENCOUNTER — Emergency Department (HOSPITAL_COMMUNITY): Payer: Self-pay

## 2022-08-07 ENCOUNTER — Inpatient Hospital Stay (HOSPITAL_COMMUNITY)
Admission: EM | Admit: 2022-08-07 | Discharge: 2022-08-08 | DRG: 202 | Disposition: A | Discharge status: Home / Self Care | Payer: Self-pay | Attending: Family Medicine | Admitting: Family Medicine

## 2022-08-07 DIAGNOSIS — F17210 Nicotine dependence, cigarettes, uncomplicated: Secondary | ICD-10-CM | POA: Diagnosis present

## 2022-08-07 DIAGNOSIS — Z7951 Long term (current) use of inhaled steroids: Secondary | ICD-10-CM

## 2022-08-07 DIAGNOSIS — Z8249 Family history of ischemic heart disease and other diseases of the circulatory system: Secondary | ICD-10-CM

## 2022-08-07 DIAGNOSIS — R7303 Prediabetes: Secondary | ICD-10-CM | POA: Diagnosis present

## 2022-08-07 DIAGNOSIS — J4541 Moderate persistent asthma with (acute) exacerbation: Principal | ICD-10-CM | POA: Diagnosis present

## 2022-08-07 DIAGNOSIS — Z1152 Encounter for screening for COVID-19: Secondary | ICD-10-CM

## 2022-08-07 DIAGNOSIS — Z825 Family history of asthma and other chronic lower respiratory diseases: Secondary | ICD-10-CM

## 2022-08-07 DIAGNOSIS — J310 Chronic rhinitis: Secondary | ICD-10-CM | POA: Diagnosis present

## 2022-08-07 DIAGNOSIS — R634 Abnormal weight loss: Secondary | ICD-10-CM | POA: Diagnosis present

## 2022-08-07 DIAGNOSIS — E8729 Other acidosis: Secondary | ICD-10-CM | POA: Diagnosis present

## 2022-08-07 DIAGNOSIS — R0603 Acute respiratory distress: Principal | ICD-10-CM

## 2022-08-07 DIAGNOSIS — F17200 Nicotine dependence, unspecified, uncomplicated: Secondary | ICD-10-CM | POA: Diagnosis present

## 2022-08-07 DIAGNOSIS — J45901 Unspecified asthma with (acute) exacerbation: Principal | ICD-10-CM | POA: Diagnosis present

## 2022-08-07 DIAGNOSIS — Z79899 Other long term (current) drug therapy: Secondary | ICD-10-CM

## 2022-08-07 LAB — CBC WITH DIFFERENTIAL/PLATELET
Abs Immature Granulocytes: 0.01 10*3/uL (ref 0.00–0.07)
Basophils Absolute: 0 10*3/uL (ref 0.0–0.1)
Basophils Relative: 0 %
Eosinophils Absolute: 0.2 10*3/uL (ref 0.0–0.5)
Eosinophils Relative: 2 %
HCT: 44.7 % (ref 39.0–52.0)
Hemoglobin: 13.9 g/dL (ref 13.0–17.0)
Immature Granulocytes: 0 %
Lymphocytes Relative: 28 %
Lymphs Abs: 2.3 10*3/uL (ref 0.7–4.0)
MCH: 21.5 pg — ABNORMAL LOW (ref 26.0–34.0)
MCHC: 31.1 g/dL (ref 30.0–36.0)
MCV: 69.1 fL — ABNORMAL LOW (ref 80.0–100.0)
Monocytes Absolute: 0.9 10*3/uL (ref 0.1–1.0)
Monocytes Relative: 11 %
Neutro Abs: 4.8 10*3/uL (ref 1.7–7.7)
Neutrophils Relative %: 59 %
Platelets: 313 10*3/uL (ref 150–400)
RBC: 6.47 MIL/uL — ABNORMAL HIGH (ref 4.22–5.81)
RDW: 18.4 % — ABNORMAL HIGH (ref 11.5–15.5)
WBC: 8.2 10*3/uL (ref 4.0–10.5)
nRBC: 0 % (ref 0.0–0.2)

## 2022-08-07 LAB — I-STAT VENOUS BLOOD GAS, ED
Acid-base deficit: 2 mmol/L (ref 0.0–2.0)
Bicarbonate: 27.6 mmol/L (ref 20.0–28.0)
Calcium, Ion: 1.27 mmol/L (ref 1.15–1.40)
HCT: 48 % (ref 39.0–52.0)
Hemoglobin: 16.3 g/dL (ref 13.0–17.0)
O2 Saturation: 95 %
Potassium: 3.8 mmol/L (ref 3.5–5.1)
Sodium: 136 mmol/L (ref 135–145)
TCO2: 30 mmol/L (ref 22–32)
pCO2, Ven: 68.9 mmHg — ABNORMAL HIGH (ref 44–60)
pH, Ven: 7.211 — ABNORMAL LOW (ref 7.25–7.43)
pO2, Ven: 94 mmHg — ABNORMAL HIGH (ref 32–45)

## 2022-08-07 LAB — I-STAT CHEM 8, ED
BUN: 12 mg/dL (ref 8–23)
Calcium, Ion: 1.27 mmol/L (ref 1.15–1.40)
Chloride: 99 mmol/L (ref 98–111)
Creatinine, Ser: 1 mg/dL (ref 0.61–1.24)
Glucose, Bld: 164 mg/dL — ABNORMAL HIGH (ref 70–99)
HCT: 49 % (ref 39.0–52.0)
Hemoglobin: 16.7 g/dL (ref 13.0–17.0)
Potassium: 3.8 mmol/L (ref 3.5–5.1)
Sodium: 137 mmol/L (ref 135–145)
TCO2: 28 mmol/L (ref 22–32)

## 2022-08-07 LAB — BASIC METABOLIC PANEL
Anion gap: 9 (ref 5–15)
BUN: 12 mg/dL (ref 8–23)
CO2: 26 mmol/L (ref 22–32)
Calcium: 9.3 mg/dL (ref 8.9–10.3)
Chloride: 100 mmol/L (ref 98–111)
Creatinine, Ser: 1.07 mg/dL (ref 0.61–1.24)
GFR, Estimated: >60 mL/min (ref >60)
Glucose, Bld: 161 mg/dL — ABNORMAL HIGH (ref 70–99)
Potassium: 3.7 mmol/L (ref 3.5–5.1)
Sodium: 135 mmol/L (ref 135–145)

## 2022-08-07 LAB — RESP PANEL BY RT-PCR (FLU A&B, COVID) ARPGX2
Influenza A by PCR: NEGATIVE
Influenza B by PCR: NEGATIVE
SARS Coronavirus 2 by RT PCR: NEGATIVE

## 2022-08-07 LAB — TROPONIN I (HIGH SENSITIVITY): Troponin I (High Sensitivity): 8 ng/L (ref <18)

## 2022-08-07 MED ORDER — LORAZEPAM 2 MG/ML IJ SOLN: 1.0000 mg | Freq: Once | INTRAMUSCULAR | Status: AC

## 2022-08-07 MED ORDER — IPRATROPIUM-ALBUTEROL 0.5-2.5 (3) MG/3ML IN SOLN
3.0000 mL | Freq: Once | RESPIRATORY_TRACT | Status: AC
  Administered 2022-08-07: 3 mL via RESPIRATORY_TRACT
  Filled 2022-08-07: qty 3

## 2022-08-07 MED ORDER — LORAZEPAM 2 MG/ML IJ SOLN
INTRAMUSCULAR | Status: AC
  Administered 2022-08-07: 1 mg via INTRAVENOUS
  Filled 2022-08-07: qty 1

## 2022-08-07 NOTE — ED Triage Notes (Signed)
PT has had shortness of breath all day. Tried 3 breathing treatments without relief. Pt drenched in swt  on arrival.

## 2022-08-07 NOTE — ED Provider Notes (Signed)
[facility] EMERGENCY DEPARTMENT Provider Note   CSN: [id number] Arrival date & time: 08/07/22  2219     History {Add pertinent medical, surgical, social history, OB history to HPI:1} Chief Complaint  Patient presents with   Respiratory Distress    Colton Ford is a 61 y.o. male.  61 year old male with prior medical history as detailed below presents for evaluation.  Patient arrives by EMS.  He comes from home.  Patient with gradually worsening shortness of breath of 2 to 3 days.  Patient reports increased shortness of breath this evening.  EMS reports tripoding and heavy sweating during transport.  Patient given Solu-Medrol 125 mg, 2 DuoNeb treatments, and magnesium.  Patient denies chest pain.  Patient appears to be in significant respiratory distress.  BiPAP placed upon patient upon arrival in the room.  The history is provided by the patient and medical records.       Home Medications Prior to Admission medications   Medication Sig Start Date End Date Taking? Authorizing Provider  albuterol (PROVENTIL) (2.5 MG/3ML) 0.083% nebulizer solution use 1 vial via nebulizer every 6 (six) hours as needed for wheezing or shortness of breath. 09/08/21   [name], [name], MD  albuterol (VENTOLIN HFA) 108 (90 Base) MCG/ACT inhaler Inhale 2 puffs into the lungs every 6 (six) hours as needed for wheezing or shortness of breath. 03/16/22   Cobb, [name], NP  albuterol (VENTOLIN HFA) 108 (90 Base) MCG/ACT inhaler Inhale 2 puffs into the lungs every 6 (six) hours as needed for wheezing or shortness of breath. 06/14/22   Cobb, [name], NP  Budeson-Glycopyrrol-Formoterol (BREZTRI AEROSPHERE) 160-9-4.8 MCG/ACT AERO Inhale 2 puffs into the lungs in the morning and at bedtime. 03/16/22   Cobb, [name], NP  fluticasone furoate-vilanterol (BREO ELLIPTA) 100-25 MCG/ACT AEPB Inhale 1 puff into the lungs daily. 06/14/22   Cobb, [name], NP  predniSONE (STERAPRED UNI-PAK 21  TAB) 10 MG (21) TBPK tablet Take by mouth daily. Take 6 tabs by mouth daily  for 2 days, then 5 tabs for 2 days, then 4 tabs for 2 days, then 3 tabs for 2 days, 2 tabs for 2 days, then 1 tab by mouth daily for 2 days 02/13/22   [name], MD  raNITIdine HCl (ACID REDUCER PO) Take 1 tablet by mouth daily as needed (acid reflux).    [provider]  Varenicline Tartrate, Starter, (CHANTIX STARTING MONTH PAK) 0.5 MG X 11 & 1 MG X 42 TBPK Take 0.5 mg by mouth daily. Please take 0.5 mg daily for 3 day, then 0.5 mg twice daily for 4 days. Then 1 mg twice daily thereafter Patient not taking: Reported on 02/13/2022 09/08/21   [name], MD      Allergies    Patient has no known allergies.    Review of Systems   Review of Systems  All other systems reviewed and are negative.   Physical Exam Updated Vital Signs Pulse (!) 122   Temp 98 F (36.7 C) (Axillary)   Resp (!) 23   Ht 6' (1.829 m)   Wt 66.7 kg   SpO2 96%   BMI 19.94 kg/m  Physical Exam Vitals and nursing note reviewed.  Constitutional:      General: He is not in acute distress.    Appearance: He is well-developed.     Comments: Alert, in visible respiratory distress, diaphoretic, tripoding.  HENT:     Head: Normocephalic and atraumatic.  Eyes:  Conjunctiva/sclera: Conjunctivae normal.     Pupils: Pupils are equal, round, and reactive to light.  Cardiovascular:     Rate and Rhythm: Regular rhythm. Tachycardia present.     Heart sounds: Normal heart sounds.  Pulmonary:     Effort: Respiratory distress present.     Comments: Significant decrease in bilateral breath sounds, significant respiratory distress present with tripoding and diaphoresis. Abdominal:     General: There is no distension.     Palpations: Abdomen is soft.     Tenderness: There is no abdominal tenderness.  Musculoskeletal:        General: No deformity. Normal range of motion.     Cervical back: Normal range of motion and neck supple.   Skin:    General: Skin is warm and dry.  Neurological:     General: No focal deficit present.     Mental Status: He is alert and oriented to person, place, and time.     ED Results / Procedures / Treatments   Labs (all labs ordered are listed, but only abnormal results are displayed) Labs Reviewed  I-STAT VENOUS BLOOD GAS, ED - Abnormal; Notable for the following components:      Result Value   pH, Ven 7.211 (*)    pCO2, Ven 68.9 (*)    pO2, Ven 94 (*)    All other components within normal limits  RESP PANEL BY RT-PCR (FLU A&B, COVID) ARPGX2  CBC WITH DIFFERENTIAL/PLATELET  BASIC METABOLIC PANEL  BRAIN NATRIURETIC PEPTIDE  I-STAT CHEM 8, ED  TROPONIN I (HIGH SENSITIVITY)    EKG EKG Interpretation  Date/Time:  Monday August 07 2022 22:47:14 EST Ventricular Rate:  111 PR Interval:  160 QRS Duration: 73 QT Interval:  320 QTC Calculation: 427 R Axis:   74 Text Interpretation: Sinus tachycardia Biatrial enlargement Anterior infarct, old Confirmed by Dene Gentry [phone]) on 08/07/2022 10:49:55 PM  Radiology DG Chest Port 1 View  Result Date: 08/07/2022 CLINICAL DATA:  Shortness of breath. EXAM: PORTABLE CHEST 1 VIEW COMPARISON:  Feb 13, 2022 FINDINGS: The heart size and mediastinal contours are within normal limits. There is moderate severity calcification of the aortic arch. Both lungs are clear. Multilevel degenerative changes are seen within the mid and lower thoracic spine. IMPRESSION: No active cardiopulmonary disease. Electronically Signed   By: Virgina Norfolk M.D.   On: 08/07/2022 22:43    Procedures Procedures  {Document cardiac monitor, telemetry assessment procedure when appropriate:1}  Medications Ordered in ED Medications  LORazepam (ATIVAN) injection 1 mg (1 mg Intravenous Given 08/07/22 2249)    ED Course/ Medical Decision Making/ A&P                           Medical Decision Making Amount and/or Complexity of Data Reviewed Labs:  ordered. Radiology: ordered.  Risk Prescription drug management.    Medical Screen Complete  This patient presented to the ED with complaint of respiratory distress.  This complaint involves an extensive number of treatment options. The initial differential diagnosis includes, but is not limited to, asthma exacerbation, CHF, metabolic abnormality, etc.  This presentation is: Acute, Chronic, Self-Limited, Previously Undiagnosed, Uncertain Prognosis, Complicated, Systemic Symptoms, and Threat to Life/Bodily Function  Patient with longstanding history of asthma presents with asthma exacerbation.  Patient with significant respiratory distress on arrival.  BiPAP initiated on arrival.  Patient is reporting prior history of intubation.  Patient appears to be initially comfortable responding well to BiPAP intervention.  Initial VBG is remarkable for pH of 7.2, PCO2 of 68.9, PO2 of 94.   Additional history obtained:  Additional history obtained from EMS and Spouse External records from outside sources obtained and reviewed including prior ED visits and prior Inpatient records.    Lab Tests:  I ordered and personally interpreted labs.  The pertinent results include: CBC, BMP, troponin, BNP, COVID, flu, i-STAT Chem-8, VBG   Imaging Studies ordered:  I ordered imaging studies including chest x-ray I independently visualized and interpreted obtained imaging which showed NAD I agree with the radiologist interpretation.   Cardiac Monitoring:  The patient was maintained on a cardiac monitor.  I personally viewed and interpreted the cardiac monitor which showed an underlying rhythm of: Sinus tach   Medicines ordered:  I ordered medication including Ativan for anxiety Reevaluation of the patient after these medicines showed that the patient: improved   Problem List / ED Course:  Respiratory distress, asthma exacerbation   Reevaluation:  After the interventions noted  above, I reevaluated the patient and found that they have: improved   Disposition:  After consideration of the diagnostic results and the patients response to treatment, I feel that the patent would benefit from ***.    {Document critical care time when appropriate:1} {Document review of labs and clinical decision tools ie heart score, Chads2Vasc2 etc:1}  {Document your independent review of radiology images, and any outside records:1} {Document your discussion with family members, caretakers, and with consultants:1} {Document social determinants of health affecting pt's care:1} {Document your decision making why or why not admission, treatments were needed:1} Final Clinical Impression(s) / ED Diagnoses Final diagnoses:  None    Rx / DC Orders ED Discharge Orders     None

## 2022-08-08 ENCOUNTER — Other Ambulatory Visit (HOSPITAL_COMMUNITY): Payer: Self-pay

## 2022-08-08 ENCOUNTER — Encounter (HOSPITAL_COMMUNITY): Payer: Self-pay | Admitting: Internal Medicine

## 2022-08-08 DIAGNOSIS — J45901 Unspecified asthma with (acute) exacerbation: Principal | ICD-10-CM | POA: Diagnosis present

## 2022-08-08 DIAGNOSIS — F17200 Nicotine dependence, unspecified, uncomplicated: Secondary | ICD-10-CM

## 2022-08-08 DIAGNOSIS — J4541 Moderate persistent asthma with (acute) exacerbation: Secondary | ICD-10-CM

## 2022-08-08 LAB — BRAIN NATRIURETIC PEPTIDE: B Natriuretic Peptide: 19.5 pg/mL (ref 0.0–100.0)

## 2022-08-08 LAB — I-STAT VENOUS BLOOD GAS, ED
Acid-base deficit: 1 mmol/L (ref 0.0–2.0)
Bicarbonate: 25.9 mmol/L (ref 20.0–28.0)
Calcium, Ion: 1.22 mmol/L (ref 1.15–1.40)
HCT: 46 % (ref 39.0–52.0)
Hemoglobin: 15.6 g/dL (ref 13.0–17.0)
O2 Saturation: 72 %
Potassium: 4.2 mmol/L (ref 3.5–5.1)
Sodium: 136 mmol/L (ref 135–145)
TCO2: 27 mmol/L (ref 22–32)
pCO2, Ven: 52 mmHg (ref 44–60)
pH, Ven: 7.305 (ref 7.25–7.43)
pO2, Ven: 43 mmHg (ref 32–45)

## 2022-08-08 LAB — COMPREHENSIVE METABOLIC PANEL
ALT: 14 U/L (ref 0–44)
AST: 21 U/L (ref 15–41)
Albumin: 3.8 g/dL (ref 3.5–5.0)
Alkaline Phosphatase: 53 U/L (ref 38–126)
Anion gap: 15 (ref 5–15)
BUN: 14 mg/dL (ref 8–23)
CO2: 25 mmol/L (ref 22–32)
Calcium: 9.7 mg/dL (ref 8.9–10.3)
Chloride: 98 mmol/L (ref 98–111)
Creatinine, Ser: 1.25 mg/dL — ABNORMAL HIGH (ref 0.61–1.24)
GFR, Estimated: >60 mL/min (ref >60)
Glucose, Bld: 195 mg/dL — ABNORMAL HIGH (ref 70–99)
Potassium: 4.3 mmol/L (ref 3.5–5.1)
Sodium: 138 mmol/L (ref 135–145)
Total Bilirubin: 0.6 mg/dL (ref 0.3–1.2)
Total Protein: 7.1 g/dL (ref 6.5–8.1)

## 2022-08-08 LAB — CBC WITH DIFFERENTIAL/PLATELET
Abs Immature Granulocytes: 0.02 10*3/uL (ref 0.00–0.07)
Basophils Absolute: 0 10*3/uL (ref 0.0–0.1)
Basophils Relative: 0 %
Eosinophils Absolute: 0 10*3/uL (ref 0.0–0.5)
Eosinophils Relative: 0 %
HCT: 42.3 % (ref 39.0–52.0)
Hemoglobin: 13.2 g/dL (ref 13.0–17.0)
Immature Granulocytes: 0 %
Lymphocytes Relative: 7 %
Lymphs Abs: 0.4 10*3/uL — ABNORMAL LOW (ref 0.7–4.0)
MCH: 21.4 pg — ABNORMAL LOW (ref 26.0–34.0)
MCHC: 31.2 g/dL (ref 30.0–36.0)
MCV: 68.7 fL — ABNORMAL LOW (ref 80.0–100.0)
Monocytes Absolute: 0.1 10*3/uL (ref 0.1–1.0)
Monocytes Relative: 1 %
Neutro Abs: 5.7 10*3/uL (ref 1.7–7.7)
Neutrophils Relative %: 92 %
Platelets: 301 10*3/uL (ref 150–400)
RBC: 6.16 MIL/uL — ABNORMAL HIGH (ref 4.22–5.81)
RDW: 18.3 % — ABNORMAL HIGH (ref 11.5–15.5)
WBC: 6.2 10*3/uL (ref 4.0–10.5)
nRBC: 0 % (ref 0.0–0.2)

## 2022-08-08 LAB — MAGNESIUM
Magnesium: 2.3 mg/dL (ref 1.7–2.4)
Magnesium: 2.5 mg/dL — ABNORMAL HIGH (ref 1.7–2.4)

## 2022-08-08 LAB — HEMOGLOBIN A1C
Hgb A1c MFr Bld: 5.9 % — ABNORMAL HIGH (ref 4.8–5.6)
Mean Plasma Glucose: 122.63 mg/dL

## 2022-08-08 LAB — TROPONIN I (HIGH SENSITIVITY): Troponin I (High Sensitivity): 11 ng/L (ref <18)

## 2022-08-08 LAB — PHOSPHORUS: Phosphorus: 3.5 mg/dL (ref 2.5–4.6)

## 2022-08-08 LAB — PROCALCITONIN: Procalcitonin: <0.1 ng/mL

## 2022-08-08 MED ORDER — METHYLPREDNISOLONE SODIUM SUCC 125 MG IJ SOLR
80.0000 mg | Freq: Two times a day (BID) | INTRAMUSCULAR | Status: DC
  Administered 2022-08-08: 80 mg via INTRAVENOUS
  Filled 2022-08-08: qty 2

## 2022-08-08 MED ORDER — ALBUTEROL SULFATE (2.5 MG/3ML) 0.083% IN NEBU
3.0000 mL | INHALATION_SOLUTION | Freq: Four times a day (QID) | RESPIRATORY_TRACT | 1 refills | Status: DC | PRN
  Filled 2022-08-08: qty 90, 8d supply, fill #0
  Filled 2022-10-10: qty 90, 8d supply, fill #1

## 2022-08-08 MED ORDER — ALBUTEROL SULFATE (2.5 MG/3ML) 0.083% IN NEBU: 2.5000 mg | INHALATION_SOLUTION | RESPIRATORY_TRACT | Status: DC | PRN

## 2022-08-08 MED ORDER — IPRATROPIUM-ALBUTEROL 0.5-2.5 (3) MG/3ML IN SOLN
3.0000 mL | Freq: Four times a day (QID) | RESPIRATORY_TRACT | Status: DC
  Administered 2022-08-08 (×2): 3 mL via RESPIRATORY_TRACT
  Filled 2022-08-08 (×2): qty 3

## 2022-08-08 MED ORDER — PREDNISONE 20 MG PO TABS
40.0000 mg | ORAL_TABLET | Freq: Every day | ORAL | 0 refills | Status: AC
  Filled 2022-08-08: qty 10, 5d supply, fill #0

## 2022-08-08 MED ORDER — ALBUTEROL SULFATE HFA 108 (90 BASE) MCG/ACT IN AERS
2.0000 | INHALATION_SPRAY | Freq: Four times a day (QID) | RESPIRATORY_TRACT | 2 refills | Status: DC | PRN
  Filled 2022-08-08: qty 6.7, 25d supply, fill #0
  Filled 2022-10-10: qty 6.7, 25d supply, fill #1

## 2022-08-08 MED ORDER — ACETAMINOPHEN 650 MG RE SUPP: 650.0000 mg | Freq: Four times a day (QID) | RECTAL | Status: DC | PRN

## 2022-08-08 MED ORDER — NICOTINE 14 MG/24HR TD PT24: 14.0000 mg | MEDICATED_PATCH | Freq: Every day | TRANSDERMAL | Status: DC | PRN

## 2022-08-08 MED ORDER — NICOTINE 14 MG/24HR TD PT24
14.0000 mg | MEDICATED_PATCH | Freq: Every day | TRANSDERMAL | 0 refills | Status: DC | PRN
  Filled 2022-08-08: qty 28, 28d supply, fill #0

## 2022-08-08 MED ORDER — ACETAMINOPHEN 325 MG PO TABS: 650.0000 mg | ORAL_TABLET | Freq: Four times a day (QID) | ORAL | Status: DC | PRN

## 2022-08-08 MED ORDER — FLUTICASONE FUROATE-VILANTEROL 100-25 MCG/ACT IN AEPB
1.0000 | INHALATION_SPRAY | Freq: Every day | RESPIRATORY_TRACT | 1 refills | Status: AC
  Filled 2022-08-08: qty 60, 30d supply, fill #0
  Filled 2023-02-27 – 2023-07-26 (×2): qty 60, 30d supply, fill #1

## 2022-08-08 MED ORDER — ALUM & MAG HYDROXIDE-SIMETH 200-200-20 MG/5ML PO SUSP
30.0000 mL | ORAL | Status: DC | PRN
  Administered 2022-08-08: 30 mL via ORAL
  Filled 2022-08-08: qty 30

## 2022-08-08 NOTE — H&P (Signed)
History and Physical    PLEASE NOTE THAT DRAGON DICTATION SOFTWARE WAS USED IN THE CONSTRUCTION OF THIS NOTE.   Colton Ford FAO:[id number] DOB: [birth year] DOA: 08/07/2022  PCP: Colton Cowden, MD  Patient coming from: home   I have personally briefly reviewed patient's old medical records in [facility] Link  Chief Complaint: Shortness of breath  HPI: Colton Ford is a 61 y.o. male with medical history significant for moderate persistent asthma who is admitted to [facility] on 08/07/2022 with acute asthma exacerbation after presenting from home to [facility] ED complaining of shortness of breath.  The patient reports to 3 days of progressive shortness of breath associated with wheezing, in the absence of any associated orthopnea, PND, or worsening of peripheral edema.  He also denies any associated chest pain, palpitations, diaphoresis, nausea, vomiting, dizziness, presyncope, or syncope.  Denies any recent subjective fever, chills, rigors, or generalized myalgias. Procedure rhinitis, rhinorrhea, vestige drip.  No recent calf tenderness, no lower extremity erythema, hemoptysis, trauma or travel.  Confirms a underlying history of moderate persistent asthma, with outpatient respiratory regimen that includes scheduled Breo Ellipta as well as as needed albuterol.  This good compliance with this home respiratory regimen.  Over the last 2 to 3 days, he notes increased frequency use of his outpatient rescue albuterol inhaler, with no significant ensuing improvement in his respiratory status with this measure, ultimately culminating in him contact EMS earlier today, at which time he was brought to [facility] emergency department for further evaluation and management.  In route to the hospital, he received Solu-Medrol as well as IV magnesium via EMS.     ED Course:  Vital signs in the ED were notable for the following: Upon arrival at [facility] emergency department, he was started empirically on  BiPAP due to increased work of breathing, without any objective hypoxia at noted at any point during the ED course; following multiple nebulizer treatments in the ED, his work of breathing improved, and he was subsequently transitioned off of BiPAP with ensuing oxygen saturation is noted to be in the mid to high 90s on room air.  Additional vital signs notable for afebrile; initial heart rates in the 120s, subsequent proving into the low 100s; blood pressure 140/88; respiratory rate 23-27;  Labs were notable for the following: I-STAT VBG showed the following: 7.211/68.9.  BMP notable for the following: Sodium 135, potassium 3.7, prn 26, creatinine 1.07, glucose 161.  BNP 19.5.  High-sensitivity troponin I found to be 8.  CBC notable for the following: White blood count 8200 with 59% neutrophils, hemoglobin 30.9.  COVID-19/influenza PCR negative.  Imaging and additional notable ED work-up: EKG, comparison to most recent prior from December 2022 shows sinus tachycardia with heart rate 111, normal intervals, nonspecific T wave inversion in aVL, no evidence of ST changes, including no evidence of ST elevation, also demonstrating Q waves in V1/V2, which were also noted to be present in most recent prior EKG from December 2022.  Chest x-ray shows no evidence of acute cardiopulmonary process, including no evidence of infiltrate, edema, effusion, or pneumothorax.  While in the ED, the following were administered: Duo nebulizer treatment, Ativan 1 mg IV x1.  Subsequently, the patient was admitted for further evaluation and management of acute asthma exacerbation. Diet   Review of Systems: As per HPI otherwise 10 point review of systems negative.   Past Medical History:  Diagnosis Date   Agitation    Asthma  Mild anemia     History reviewed. No pertinent surgical history.  Social History:  reports that he has been smoking cigarettes. He has a 6.00 pack-year smoking history. He has never used  smokeless tobacco. He reports that he does not drink alcohol and does not use drugs.   No Known Allergies  Family History  Problem Relation Age of Onset   Asthma Sister    Heart disease Sister    Heart disease Brother     Family history reviewed and not pertinent    Prior to Admission medications   Medication Sig Start Date End Date Taking? Authorizing Provider  albuterol (PROVENTIL) (2.5 MG/3ML) 0.083% nebulizer solution use 1 vial via nebulizer every 6 (six) hours as needed for wheezing or shortness of breath. 09/08/21   [name], [name], MD  albuterol (VENTOLIN HFA) 108 (90 Base) MCG/ACT inhaler Inhale 2 puffs into the lungs every 6 (six) hours as needed for wheezing or shortness of breath. 03/16/22   Cobb, Colton Schwalbe, NP  albuterol (VENTOLIN HFA) 108 (90 Base) MCG/ACT inhaler Inhale 2 puffs into the lungs every 6 (six) hours as needed for wheezing or shortness of breath. 06/14/22   Cobb, Colton Schwalbe, NP  Budeson-Glycopyrrol-Formoterol (BREZTRI AEROSPHERE) 160-9-4.8 MCG/ACT AERO Inhale 2 puffs into the lungs in the morning and at bedtime. 03/16/22   Cobb, Colton Schwalbe, NP  fluticasone furoate-vilanterol (BREO ELLIPTA) 100-25 MCG/ACT AEPB Inhale 1 puff into the lungs daily. 06/14/22   Cobb, Colton Schwalbe, NP  predniSONE (STERAPRED UNI-PAK 21 TAB) 10 MG (21) TBPK tablet Take by mouth daily. Take 6 tabs by mouth daily  for 2 days, then 5 tabs for 2 days, then 4 tabs for 2 days, then 3 tabs for 2 days, 2 tabs for 2 days, then 1 tab by mouth daily for 2 days 02/13/22   Colton Leigh, MD  raNITIdine HCl (ACID REDUCER PO) Take 1 tablet by mouth daily as needed (acid reflux).    [provider]  Varenicline Tartrate, Starter, (CHANTIX STARTING MONTH PAK) 0.5 MG X 11 & 1 MG X 42 TBPK Take 0.5 mg by mouth daily. Please take 0.5 mg daily for 3 day, then 0.5 mg twice daily for 4 days. Then 1 mg twice daily thereafter Patient not taking: Reported on 02/13/2022 09/08/21   Colton Beard, MD      Objective    Physical Exam: Vitals:   08/07/22 2225 08/07/22 2226 08/07/22 2229 08/08/22 0030  BP:    (!) 140/88  Pulse: (!) 124  (!) 122 (!) 102  Resp: (!) 27  (!) 23 (!) 25  Temp: 98 F (36.7 C)     TempSrc: Axillary     SpO2: 96%  96% 97%  Weight:  66.7 kg    Height:  6' (1.829 m)      General: appears to be stated age; alert, oriented; mildly increased work of breathing noted Skin: warm, dry, no rash Head:  AT/[place] Mouth:  Oral mucosa membranes appear moist, normal dentition Neck: supple; trachea midline Heart:  RRR; did not appreciate any M/R/G Lungs: CTAB, mild bilateral expiratory wheezes noted; did not appreciate any rales, or rhonchi Abdomen: + BS; soft, ND, NT Vascular: 2+ pedal pulses b/l; 2+ radial pulses b/l Extremities: no peripheral edema, no muscle wasting Neuro: strength and sensation intact in upper and lower extremities b/l    Labs on Admission: I have personally reviewed following labs and imaging studies  CBC: Recent Labs  Lab 08/07/22 2235 08/07/22 2248 08/07/22  2249  WBC 8.2  --   --   NEUTROABS 4.8  --   --   HGB 13.9 16.3 16.7  HCT 44.7 48.0 49.0  MCV 69.1*  --   --   PLT 313  --   --    Basic Metabolic Panel: Recent Labs  Lab 08/07/22 2235 08/07/22 2248 08/07/22 2249  NA 135 136 137  K 3.7 3.8 3.8  CL 100  --  99  CO2 26  --   --   GLUCOSE 161*  --  164*  BUN 12  --  12  CREATININE 1.07  --  1.00  CALCIUM 9.3  --   --    GFR: Estimated Creatinine Clearance: 71.3 mL/min (by C-G formula based on SCr of 1 mg/dL). Liver Function Tests: No results for input(s): "AST", "ALT", "ALKPHOS", "BILITOT", "PROT", "ALBUMIN" in the last 168 hours. No results for input(s): "LIPASE", "AMYLASE" in the last 168 hours. No results for input(s): "AMMONIA" in the last 168 hours. Coagulation Profile: No results for input(s): "INR", "PROTIME" in the last 168 hours. Cardiac Enzymes: No results for input(s): "CKTOTAL", "CKMB", "CKMBINDEX",  "TROPONINI" in the last 168 hours. BNP (last 3 results) No results for input(s): "PROBNP" in the last 8760 hours. HbA1C: No results for input(s): "HGBA1C" in the last 72 hours. CBG: No results for input(s): "GLUCAP" in the last 168 hours. Lipid Profile: No results for input(s): "CHOL", "HDL", "LDLCALC", "TRIG", "CHOLHDL", "LDLDIRECT" in the last 72 hours. Thyroid Function Tests: No results for input(s): "TSH", "T4TOTAL", "FREET4", "T3FREE", "THYROIDAB" in the last 72 hours. Anemia Panel: No results for input(s): "VITAMINB12", "FOLATE", "FERRITIN", "TIBC", "IRON", "RETICCTPCT" in the last 72 hours. Urine analysis:    Component Value Date/Time   COLORURINE STRAW (A) 04/02/2011 0603   APPEARANCEUR CLEAR 04/02/2011 0603   LABSPEC 1.011 04/02/2011 0603   PHURINE 6.0 04/02/2011 0603   GLUCOSEU NEGATIVE 04/02/2011 0603   HGBUR NEGATIVE 04/02/2011 0603   BILIRUBINUR NEGATIVE 04/02/2011 0603   KETONESUR NEGATIVE 04/02/2011 0603   PROTEINUR NEGATIVE 04/02/2011 0603   UROBILINOGEN 0.2 04/02/2011 0603   NITRITE NEGATIVE 04/02/2011 0603   LEUKOCYTESUR TRACE (A) 04/02/2011 0603    Radiological Exams on Admission: DG Chest Port 1 View  Result Date: 08/07/2022 CLINICAL DATA:  Shortness of breath. EXAM: PORTABLE CHEST 1 VIEW COMPARISON:  Feb 13, 2022 FINDINGS: The heart size and mediastinal contours are within normal limits. There is moderate severity calcification of the aortic arch. Both lungs are clear. Multilevel degenerative changes are seen within the mid and lower thoracic spine. IMPRESSION: No active cardiopulmonary disease. Electronically Signed   By: Aram Candela M.D.   On: 08/07/2022 22:43     EKG: Independently reviewed, with result as described above.    Assessment/Plan   Principal Problem:   Acute asthma exacerbation Active Problems:   Tobacco use disorder      #) Acute asthma exacerbation: In the context of documented history of moderate persistent asthma, the  patient presents with 2-3 days of progressive shortness of breath associated with new onset expiratory wheezing, increased work of breathing, with chest x-ray showing no evidence of acute cardiopulmonary process, including no evidence of infiltrate or edema..  Without clear preceding exacerbating factors, including no overt evidence of underlying infection, will also noting COVID-19/influenza PCR negative.  Overall, no clinical or radiographic evidence to suggest acutely decompensated heart failure.  Patient notes good compliance with his outpatient respiratory regimen, as detailed above.  He does continue to smoke  cigarettes, and is down approximately 1/5 pack/day.  The only blood gas result is available at this time was an i-STAT VBG showing very mild respiratory acidosis.  We will repeat blood gas in the morning with a noni-STAT VBG, and closely monitor his ensuing respiratory status including response to scheduled prn breathing treatments as well as additional systemic corticosteroids, as further detailed below.    Plan: Scheduled duo nebulizer treatments, prn albuterol, Solu-Medrol.  Check serum magnesium and phosphorus levels.  Repeat VBG in the morning to evaluate for any interval evidence to suggest respiratory fatigue.  Monitor continuous pulse oximetry.  Add on procalcitonin level.  Repeat CMP and CBC in the morning.             #) Chronic tobacco abuse: Patient conveys that they are a current smoker, having smoked for the better part of 30 years.  Has attempted to cut down, and is now down to approximately 1/5 pack/day.   Plan: Counseled the patient for less than 2 minutes on the importance of complete smoking discontinuation.  Order placed for prn nicotine patch for use during this hospitalization.     DVT prophylaxis: SCD's   Code Status: Full code Family Communication: none Disposition Plan: Per Rounding Team Consults called: none;  Admission status: Inpatient    PLEASE  NOTE THAT DRAGON DICTATION SOFTWARE WAS USED IN THE CONSTRUCTION OF THIS NOTE.   Chaney Born Karry Causer DO [facility]  From 7PM - 7AM   08/08/2022, 1:24 AM

## 2022-08-08 NOTE — Discharge Summary (Addendum)
This is Colton Ford Ship broker note, see discharge summary from 11/7, 12:50 PM Fayrene Helper, MD  Discharge [place]: [facility] [phone]) Admit date: 08/07/22  Discharge Date: 08/08/22 Service: Hospitalist  Attending: MD Florene Glen  Resident/APP: Polly Cobia Student AGACNP     Discharge Diagnoses: Acute Asthma Exacerbation   Key Points for Follow Up:  14 lb weight loss over Kamaya Keckler few months, follow up with Pulmonologist MD Melvyn Novas for possible low dose lung CT for lung Cancer screening.  Follow up with PCP with other recommended cancer screenings  Follow up with PCP in regards to Hgb A1C being 5.9 and 14lb wt loss  Follow up Labs: CBC/BMET  Pending results: n/Colton Ford   Condition at Discharge: Medically stable   Hospital Course: MB 61 yr old male with significant past medical history of moderate persistent asthma with approximately [name] 30 pack year smoking history admitted to [facility] on 08/07/22 with complaints of shortness of breath and found to have an acute asthma exacerbation. Patient was started on BIPAP, nebulizer treatments, and IV steroids. Patient did improve from those interventions and was transitioned off BIPAP to nasal cannula and eventually room air. On 08/08/22, patient's vital signs stable and had no presence of increased work of breathing, and inspiratory or expiratory wheezing.  Assessment and Plan:  Acute Asthma Exacerbation  Shortness of breath, wheezing improved with nebulizer treatments and IV steroids. Patient no longer needing BIPAP or supplemental oxygen. Covid/Flu Negative, Chest X-ray negative for cardiopulmonary abnormality  -Patient adequate for discharge, vital signs stable -Continue rescue inhaler: albuterol 4-6hrs for the next couple days  -Continue controller medication daily: Breo Elipta  -Will prescribe oral prednisone 40mg /day PO for 5 days   Weight loss: 14lb weight loss over last few months Follow up with Pulmonologist and PCP  outpatient   Smoking Hx: ~ 30 pack year past hx, occasionally smokes [name] 1 to 2 cigarettes/ 2 weeks- patient working on quitting   -Will prescribe Nicotine Transdermal Patches      Procedures Performed: n/Dajae Kizer    Subjective at Discharge: Patient states, "breathing is much better."   Objective at Discharge: 1404   VS:  BP: 162/99    Pulse: 102    Resp: 19    Temp: 97.7  F oral    SpO2: 95 RA    ? Physical Exam  General: Alert and in no distres HEENT: Head Normocephalic. PERRLA and EOM intact. Ears: No hearing loss. Nose/Throat: Mucous Membranes pink, moist  Cardiovascular: No murmurs, rubs, gallops. S1, S2, regular rate auscultated. No bilateral edema. Radial and Pedal Pulses 2 + bilaterally  Pulmonary: Breath sounds clear in upper bases, slightly diminished in lower bases anteriorly and posteriorly. No accessory muscle use or increased WOB.  GI: Bowel sounds active in all quadrants. No masses or hernias present.  GU: Deferred  Msk: ROM intact, 5/5 extremity strength bilaterally in upper and lower extremities  Neuro: Oriented x4, CN II-XII intact. Psych: No depression, anxiety, or thoughts of self harm present   Lab Results: 11/6 Covid/Flu PCR Negative   VBG  pH 7.305 pCO2 52.0  pO2 43  Bicarb 25.9   CMET: Na 138, K 4.3, CL 98, CO2 25, Glucose 195, BUN 14, Cr 1.25, Ca 9.7, AG 15, Phos 3.5, Mag 2.5, ALP 53, AST 3.8, ALT 14, Total Protein 7.1, Total Bilirubin 0.6, GFR > 60  CBC WBC 6.2, RBC 6.16, Hgb 13.2, HCT 42.3, MCV 68.7, MCH 21.4, RDW 18.3, Plt 301  Neutrophil  count 5.7   HbA1c: 5.9   Imaging Results:   11/6 Chest X-ray: Negative for cardiopulmonary abnormality     Discharge Medication List:  Nicotine 14mg  Transdermal Daily PRN  Prednisone 40mg  PO daily  Albuterol 2.5 mg/76ml nebulizer solution  Albuterol 108 MCG/ACT Inhale 2 puffs-every 4 to 6hrs PRN  Breo Elipta 100-25 MCG/ACT- 1 puff/day          Disposition:  Home    Follow Up: PCP, Pulmonology

## 2022-08-08 NOTE — ED Provider Notes (Signed)
Patient signed out to me by Dr. Francia Greaves.  Patient seen for shortness of breath secondary to asthma exacerbation.  Patient comfortable on BiPAP.  Work-up has been completed.  Chest x-ray without pneumonia.  COVID, flu negative.  No evidence of congestive heart failure.  Initial blood gas with mild respiratory acidosis.  Will require hospitalization for further management.   Orpah Greek, MD 08/08/22 0004

## 2022-08-08 NOTE — ED Notes (Signed)
Pt in room awaiting meds from [place].

## 2022-08-08 NOTE — ED Notes (Signed)
This NT walked pt around pod. Pt did great while walking no issues with breathing. Pt was connected back to monitor with O2 stats at 93.

## 2022-08-08 NOTE — Discharge Summary (Signed)
Physician Discharge Summary  Colton Ford JZP:[id number] DOB: [birth year] DOA: 08/07/2022  PCP: Colton Cowden, MD  Admit date: 08/07/2022 Discharge date: 08/08/2022  Time spent: 40 minutes  Recommendations for Outpatient Follow-up:  Follow outpatient CBC/CMP  Follow asthma medications, ensure he's able to afford these/obtain this outpatient ([facility] program he says he's enrolled in?) Weight loss unintentional needs outpatient follow up -> low dose CT scan reasonable given smoking history Needs additional work up for weight loss with PCP, needs to be up to date with routine cancer screening, needs general work up for weight loss as well    Discharge Diagnoses:  Principal Problem:   Acute asthma exacerbation Active Problems:   Tobacco use disorder   Discharge Condition: stable  Diet recommendation: heart healthy  Filed Weights   08/07/22 2226  Weight: 66.7 kg    History of present illness:   Colton Ford is Colton Ford 61 y.o. male with medical history significant for moderate persistent asthma who is admitted to [facility] on 08/07/2022 with acute asthma exacerbation after presenting from home to [facility] ED complaining of shortness of breath.   He was admitted with an asthma exam.  Improved on my evaluation after treatment, eager to discharge home.  Ambulated without desaturating on RA.  Discharged with refills provided.  See below for additional details  Hospital Course:  Assessment and Plan: Acute Asthma Exacerbation Negative CXR Initially wheezing on my exam, but since improved Will send meds from [facility] pharmacy to bedside, discharge with albuterol, breo ellipta, steroids Instructed to follow up with Colton Ford as an outpatient to follow up regarding meds and cost (some issues for him regarding cost, but apparently now in [facility] program)  Unintentional Weight Loss Needs workup outpatient Given smoking hx, would follow low dose CT scan with Colton Ford Also, follow  with PCP to ensure up to date with routine cancer screening as well as general workup for unintentional weight loss  Prediabetes Follow with PCP     Procedures: none   Consultations: none  Discharge Exam: Vitals:   08/08/22 0758 08/08/22 0805  BP: (!) 145/90 (!) 146/81  Pulse: 82 83  Resp: 14 14  Temp:  97.7 F (36.5 C)  SpO2: 92% 92%   Eager to discharge Says he wants to go home  Colton Ford with ED provider and did not desat  General: No acute distress. Cardiovascular: Heart sounds show Colton Ford regular rate, and rhythm.  Lungs: diffuse moderate wheezing on my initial exam, this cleared up on reevaluation by by student  Abdomen: Soft, nontender, nondistended  Neurological: Alert and oriented 3. Moves all extremities 4 . Cranial nerves II through XII grossly intact. Skin: Warm and dry. No rashes or lesions. Extremities: No clubbing or cyanosis. No edema.  Discharge Instructions   Discharge Instructions     Call MD for:  difficulty breathing, headache or visual disturbances   Complete by: As directed    Call MD for:  extreme fatigue   Complete by: As directed    Call MD for:  hives   Complete by: As directed    Call MD for:  persistant dizziness or light-headedness   Complete by: As directed    Call MD for:  persistant nausea and vomiting   Complete by: As directed    Call MD for:  redness, tenderness, or signs of infection (pain, swelling, redness, odor or green/yellow discharge around incision site)   Complete by: As directed    Call MD  for:  severe uncontrolled pain   Complete by: As directed    Call MD for:  temperature >100.4   Complete by: As directed    Diet - low sodium heart healthy   Complete by: As directed    Discharge instructions   Complete by: As directed    You were seen for an asthma exacerbation.  You've improved with steroids and breathing treatments.  We'll prescribe you breathing treatments and steroids to take at home.  Continue your  controller medicine daily.  Use the albuterol every 4-6 hours for 1-2 days when you get home, then use as needed for shortness of breath.  You've had 14 lbs of weight loss in Colton Ford few months.  Please ask Colton Ford whether your Colton Ford candidate for Colton Ford low dose lung CT scan to screen for lung cancer.  Ask your PCP to look into your weight loss to see if there's anything else they recommend.  You should make sure you're up to date with your cancer screening.  Return for new, recurrent, or worsening symptoms.  Please ask your PCP to request records from this hospitalization so they know what was done and what the next steps will be.   Increase activity slowly   Complete by: As directed       Allergies as of 08/08/2022   No Known Allergies      Medication List     STOP taking these medications    Breztri Aerosphere 160-9-4.8 MCG/ACT Aero Generic drug: Budeson-Glycopyrrol-Formoterol   budesonide-formoterol 160-4.5 MCG/ACT inhaler Commonly known as: SYMBICORT   Chantix Starting Month Pak 0.5 MG X 11 & 1 MG X 42 Tbpk Generic drug: Varenicline Tartrate (Starter)   predniSONE 10 MG (21) Tbpk tablet Commonly known as: STERAPRED UNI-PAK 21 TAB Replaced by: predniSONE 20 MG tablet       TAKE these medications    ACID REDUCER PO Take 2 tablets by mouth daily as needed (acid reflux).   albuterol (2.5 MG/3ML) 0.083% nebulizer solution Commonly known as: PROVENTIL use 1 vial via nebulizer every 6 (six) hours as needed for wheezing or shortness of breath.   albuterol 108 (90 Base) MCG/ACT inhaler Commonly known as: VENTOLIN HFA Inhale 2 puffs into the lungs every 6 (six) hours as needed for wheezing or shortness of breath.   fluticasone furoate-vilanterol 100-25 MCG/ACT Aepb Commonly known as: Breo Ellipta Inhale 1 puff into the lungs daily. What changed:  how much to take when to take this reasons to take this   multivitamin tablet Take 1 tablet by mouth daily.   nicotine 14 mg/24hr  patch Commonly known as: NICODERM CQ - dosed in mg/24 hours Place 1 patch (14 mg total) onto the skin daily as needed (smoking cessation).   predniSONE 20 MG tablet Commonly known as: DELTASONE Take 2 tablets (40 mg total) by mouth daily for 5 days. Replaces: predniSONE 10 MG (21) Tbpk tablet       No Known Allergies    The results of significant diagnostics from this hospitalization (including imaging, microbiology, ancillary and laboratory) are listed below for reference.    Significant Diagnostic Studies: DG Chest Port 1 View  Result Date: 08/07/2022 CLINICAL DATA:  Shortness of breath. EXAM: PORTABLE CHEST 1 VIEW COMPARISON:  Feb 13, 2022 FINDINGS: The heart size and mediastinal contours are within normal limits. There is moderate severity calcification of the aortic arch. Both lungs are clear. Multilevel degenerative changes are seen within the mid and lower thoracic spine. IMPRESSION: No  active cardiopulmonary disease. Electronically Signed   By: Aram Candela M.D.   On: 08/07/2022 22:43    Microbiology: Recent Results (from the past 240 hour(s))  Resp Panel by RT-PCR (Flu Amarachi Kotz&B, Covid) Anterior Nasal Swab     Status: None   Collection Time: 08/07/22 10:29 PM   Specimen: Anterior Nasal Swab  Result Value Ref Range Status   SARS Coronavirus 2 by RT PCR NEGATIVE NEGATIVE Final    Comment: (NOTE) SARS-CoV-2 target nucleic acids are NOT DETECTED.  The SARS-CoV-2 RNA is generally detectable in upper respiratory specimens during the acute phase of infection. The lowest concentration of SARS-CoV-2 viral copies this assay can detect is 138 copies/mL. Jazier Mcglamery negative result does not preclude SARS-Cov-2 infection and should not be used as the sole basis for treatment or other patient management decisions. Menelik Mcfarren negative result may occur with  improper specimen collection/handling, submission of specimen other than nasopharyngeal swab, presence of viral mutation(s) within the areas  targeted by this assay, and inadequate number of viral copies(<138 copies/mL). Shanece Cochrane negative result must be combined with clinical observations, patient history, and epidemiological information. The expected result is Negative.  Fact Sheet for Patients:  BloggerCourse.com  Fact Sheet for Healthcare Providers:  SeriousBroker.it  This test is no t yet approved or cleared by the [place] FDA and  has been authorized for detection and/or diagnosis of SARS-CoV-2 by FDA under an Emergency Use Authorization (EUA). This EUA will remain  in effect (meaning this test can be used) for the duration of the COVID-19 declaration under Section 564(b)(1) of the Act, 21 U.S.C.section 360bbb-3(b)(1), unless the authorization is terminated  or revoked sooner.       Influenza Anissa Abbs by PCR NEGATIVE NEGATIVE Final   Influenza B by PCR NEGATIVE NEGATIVE Final    Comment: (NOTE) The Xpert Xpress SARS-CoV-2/FLU/RSV plus assay is intended as an aid in the diagnosis of influenza from Nasopharyngeal swab specimens and should not be used as [place] sole basis for treatment. Nasal washings and aspirates are unacceptable for Xpert Xpress SARS-CoV-2/FLU/RSV testing.  Fact Sheet for Patients: BloggerCourse.com  Fact Sheet for Healthcare Providers: SeriousBroker.it  This test is not yet approved or cleared by the [place] FDA and has been authorized for detection and/or diagnosis of SARS-CoV-2 by FDA under an Emergency Use Authorization (EUA). This EUA will remain in effect (meaning this test can be used) for the duration of the COVID-19 declaration under Section 564(b)(1) of the Act, 21 U.S.C. section 360bbb-3(b)(1), unless the authorization is terminated or revoked.  Performed at [facility], [address] [place], [place] [address]      Labs: Basic Metabolic Panel: Recent Labs  Lab  08/07/22 2235 08/07/22 2248 08/07/22 2249 08/08/22 0130 08/08/22 0240 08/08/22 0257  NA 135 136 137  --  138 136  K 3.7 3.8 3.8  --  4.3 4.2  CL 100  --  99  --  98  --   CO2 26  --   --   --  25  --   GLUCOSE 161*  --  164*  --  195*  --   BUN 12  --  12  --  14  --   CREATININE 1.07  --  1.00  --  1.25*  --   CALCIUM 9.3  --   --   --  9.7  --   MG  --   --   --  2.3 2.5*  --   PHOS  --   --   --   --  3.5  --    Liver Function Tests: Recent Labs  Lab 08/08/22 0240  AST 21  ALT 14  ALKPHOS 53  BILITOT 0.6  PROT 7.1  ALBUMIN 3.8   No results for input(s): "LIPASE", "AMYLASE" in the last 168 hours. No results for input(s): "AMMONIA" in the last 168 hours. CBC: Recent Labs  Lab 08/07/22 2235 08/07/22 2248 08/07/22 2249 08/08/22 0240 08/08/22 0257  WBC 8.2  --   --  6.2  --   NEUTROABS 4.8  --   --  5.7  --   HGB 13.9 16.3 16.7 13.2 15.6  HCT 44.7 48.0 49.0 42.3 46.0  MCV 69.1*  --   --  68.7*  --   PLT 313  --   --  301  --    Cardiac Enzymes: No results for input(s): "CKTOTAL", "CKMB", "CKMBINDEX", "TROPONINI" in the last 168 hours. BNP: BNP (last 3 results) Recent Labs    09/07/21 1432 02/13/22 1646 08/07/22 2235  BNP 15.3 62.7 19.5    ProBNP (last 3 results) No results for input(s): "PROBNP" in the last 8760 hours.  CBG: No results for input(s): "GLUCAP" in the last 168 hours.     Signed:  Lacretia Nicks MD.  [facility] 08/08/2022, 12:50 PM

## 2022-08-08 NOTE — Progress Notes (Signed)
Patient resting comfortably without any respiratory distress noted.  Bipap currently not indicated at this time.  Will continue to monitor.

## 2022-08-08 NOTE — Discharge Planning (Signed)
RNCM consulted for medication assistance. RNCM reviewed chart and spoke with the pt about [facility] program ($3 co pay for each Rx through MATCH program, does not include refills, 7 day expiration of MATCH letter and choice of pharmacies). Pt is eligible for [facility] program (unable to find pt listed in [facility] per cardholder name inquiry) and has agreed to accept MATCH with co-pay override. [facility] information entered; [facility] to fill and deliver to pt at bedside prior to discharge home today. RNCM updated EDP and ED RN.     

## 2022-08-08 NOTE — ED Notes (Signed)
Pt off of BiPap and currently on 5L nasal canula.

## 2022-08-22 IMAGING — DX DG CHEST 1V PORT
2 series · 2 of 2 positions shown · non-contrast
Comparison: September 07, 2021.

CLINICAL DATA: A 60-year-old male stents for evaluation of cough.

EXAM:
PORTABLE CHEST 1 VIEW

[chest ap (1 of 2)]
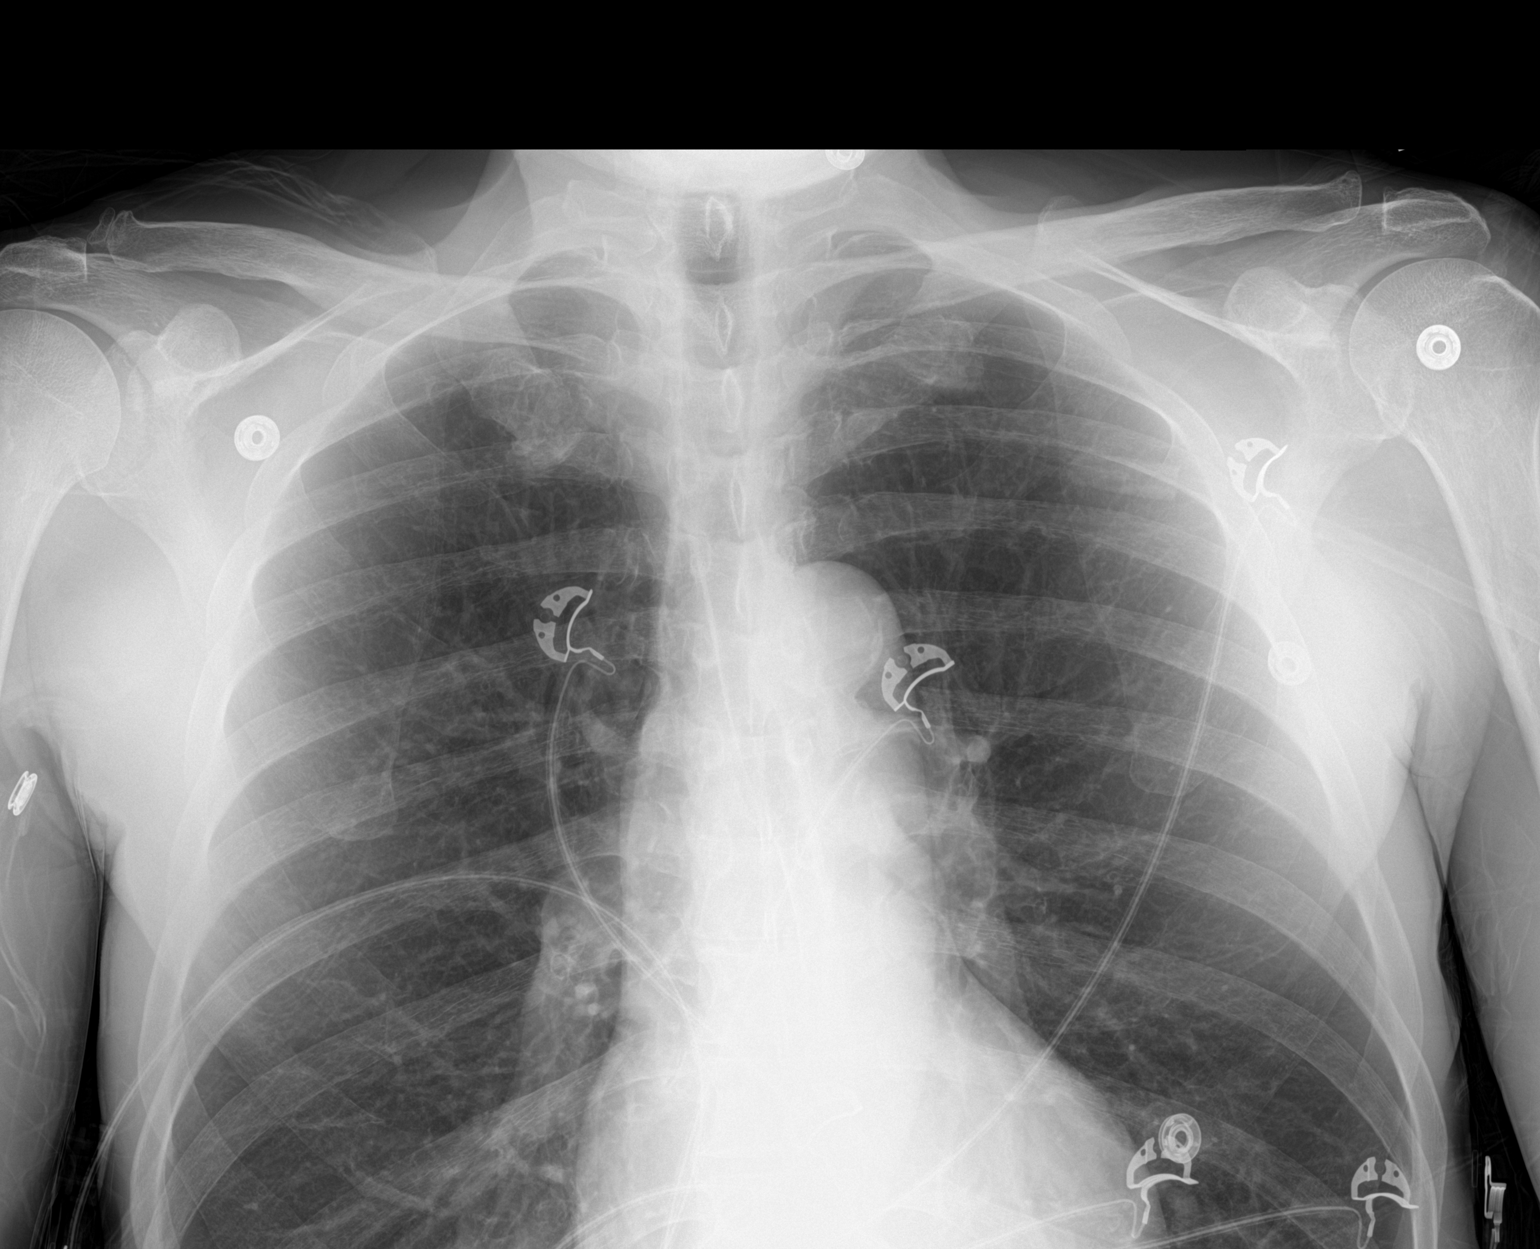

[chest ap (2 of 2)]
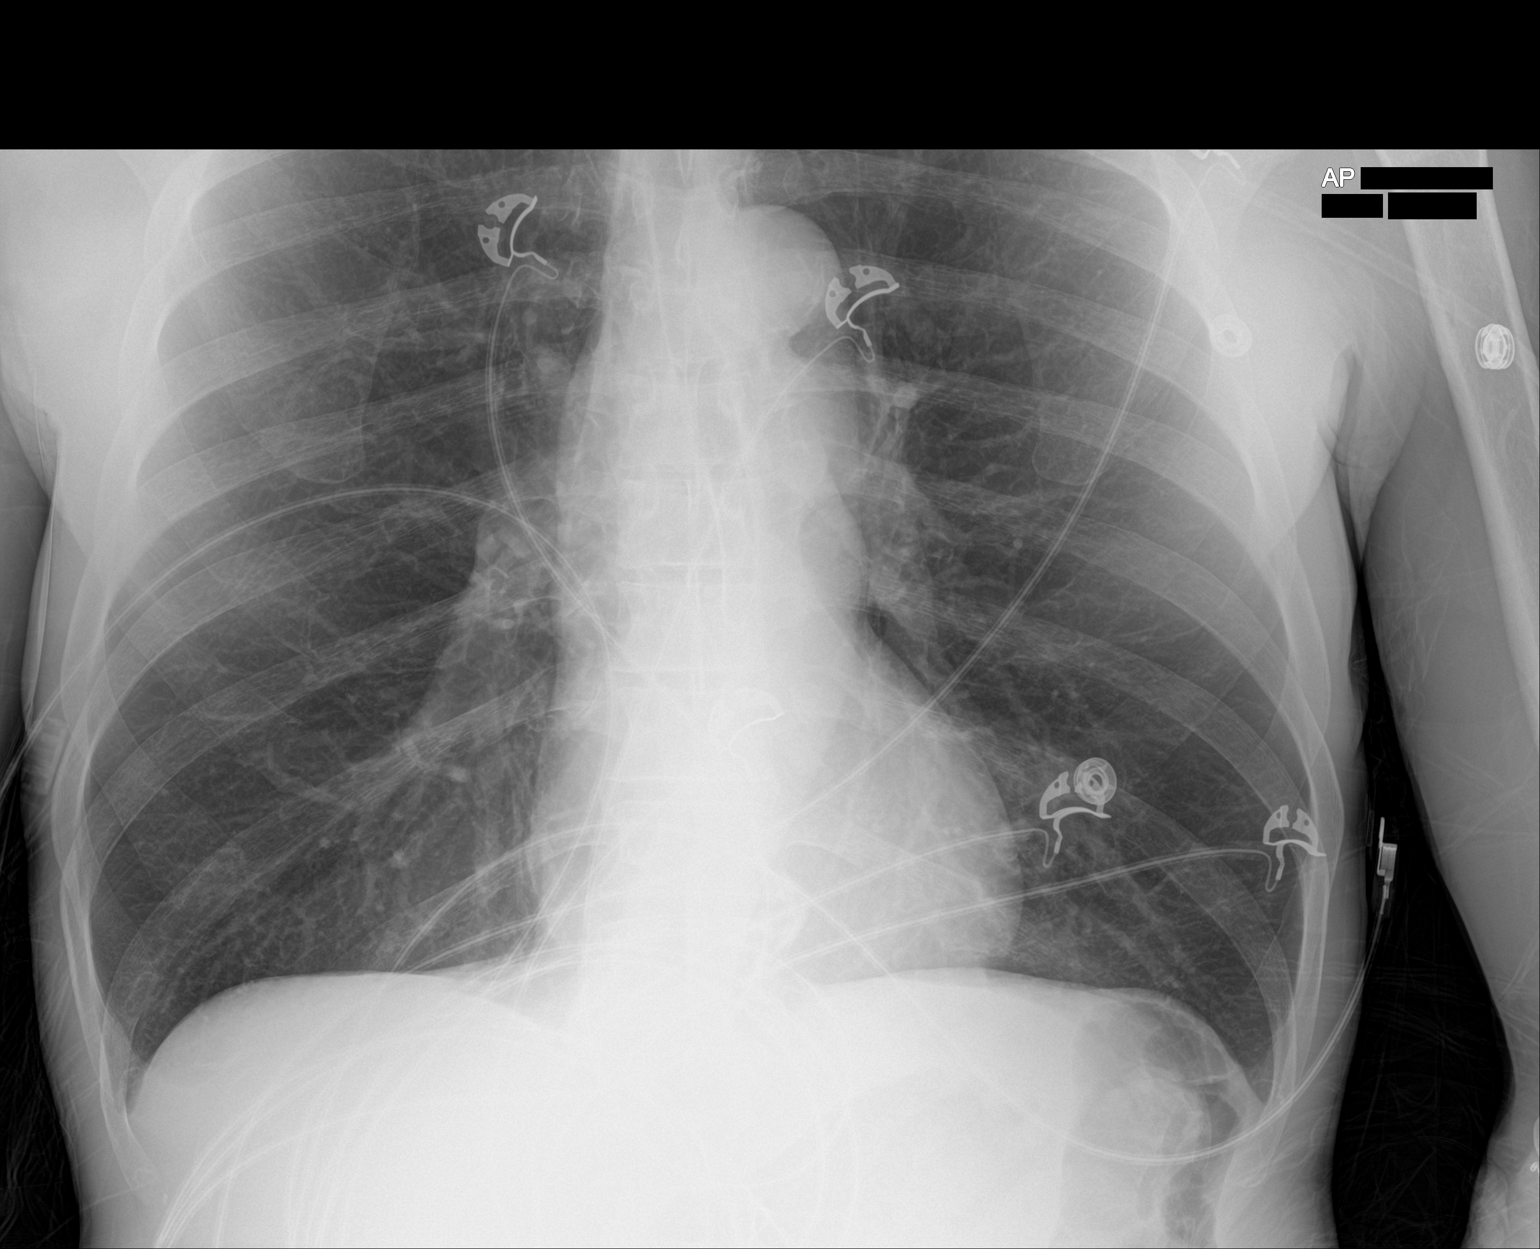

[2 of 2 positions shown; findings below may reference images not displayed]

FINDINGS: EKG leads project over the chest.

Lungs are clear.

Cardiomediastinal contours and hilar structures are normal.

On limited assessment there is no acute skeletal process.
IMPRESSION: No acute cardiopulmonary disease.

## 2022-10-10 ENCOUNTER — Other Ambulatory Visit (HOSPITAL_COMMUNITY): Payer: Self-pay

## 2022-11-02 ENCOUNTER — Encounter (HOSPITAL_COMMUNITY): Payer: Self-pay

## 2022-11-02 ENCOUNTER — Emergency Department (HOSPITAL_COMMUNITY): Payer: Medicaid Other

## 2022-11-02 ENCOUNTER — Observation Stay (HOSPITAL_COMMUNITY)
Admission: EM | Admit: 2022-11-02 | Discharge: 2022-11-03 | DRG: 190 | Disposition: A | Discharge status: Home / Self Care | Payer: Medicaid Other | Attending: Internal Medicine | Admitting: Internal Medicine

## 2022-11-02 ENCOUNTER — Other Ambulatory Visit: Payer: Self-pay

## 2022-11-02 DIAGNOSIS — I444 Left anterior fascicular block: Secondary | ICD-10-CM | POA: Diagnosis not present

## 2022-11-02 DIAGNOSIS — I10 Essential (primary) hypertension: Secondary | ICD-10-CM | POA: Diagnosis present

## 2022-11-02 DIAGNOSIS — Z1152 Encounter for screening for COVID-19: Secondary | ICD-10-CM

## 2022-11-02 DIAGNOSIS — R0902 Hypoxemia: Secondary | ICD-10-CM

## 2022-11-02 DIAGNOSIS — K219 Gastro-esophageal reflux disease without esophagitis: Secondary | ICD-10-CM | POA: Diagnosis not present

## 2022-11-02 DIAGNOSIS — Z825 Family history of asthma and other chronic lower respiratory diseases: Secondary | ICD-10-CM | POA: Diagnosis not present

## 2022-11-02 DIAGNOSIS — J440 Chronic obstructive pulmonary disease with acute lower respiratory infection: Secondary | ICD-10-CM | POA: Diagnosis not present

## 2022-11-02 DIAGNOSIS — J9601 Acute respiratory failure with hypoxia: Secondary | ICD-10-CM | POA: Diagnosis present

## 2022-11-02 DIAGNOSIS — Z720 Tobacco use: Secondary | ICD-10-CM | POA: Insufficient documentation

## 2022-11-02 DIAGNOSIS — F17210 Nicotine dependence, cigarettes, uncomplicated: Secondary | ICD-10-CM | POA: Diagnosis present

## 2022-11-02 DIAGNOSIS — E871 Hypo-osmolality and hyponatremia: Secondary | ICD-10-CM | POA: Diagnosis present

## 2022-11-02 DIAGNOSIS — J441 Chronic obstructive pulmonary disease with (acute) exacerbation: Principal | ICD-10-CM | POA: Diagnosis present

## 2022-11-02 DIAGNOSIS — Z8249 Family history of ischemic heart disease and other diseases of the circulatory system: Secondary | ICD-10-CM | POA: Diagnosis not present

## 2022-11-02 DIAGNOSIS — J189 Pneumonia, unspecified organism: Secondary | ICD-10-CM | POA: Diagnosis present

## 2022-11-02 DIAGNOSIS — A419 Sepsis, unspecified organism: Secondary | ICD-10-CM | POA: Diagnosis present

## 2022-11-02 LAB — COMPREHENSIVE METABOLIC PANEL
ALT: 17 U/L (ref 0–44)
AST: 28 U/L (ref 15–41)
Albumin: 3.9 g/dL (ref 3.5–5.0)
Alkaline Phosphatase: 64 U/L (ref 38–126)
Anion gap: 13 (ref 5–15)
BUN: 13 mg/dL (ref 8–23)
CO2: 28 mmol/L (ref 22–32)
Calcium: 9.3 mg/dL (ref 8.9–10.3)
Chloride: 97 mmol/L — ABNORMAL LOW (ref 98–111)
Creatinine, Ser: 1.11 mg/dL (ref 0.61–1.24)
GFR, Estimated: >60 mL/min (ref >60)
Glucose, Bld: 91 mg/dL (ref 70–99)
Potassium: 3.8 mmol/L (ref 3.5–5.1)
Sodium: 138 mmol/L (ref 135–145)
Total Bilirubin: 0.3 mg/dL (ref 0.3–1.2)
Total Protein: 7.3 g/dL (ref 6.5–8.1)

## 2022-11-02 LAB — CBC WITH DIFFERENTIAL/PLATELET
Abs Immature Granulocytes: 0.02 10*3/uL (ref 0.00–0.07)
Basophils Absolute: 0 10*3/uL (ref 0.0–0.1)
Basophils Relative: 1 %
Eosinophils Absolute: 0.2 10*3/uL (ref 0.0–0.5)
Eosinophils Relative: 2 %
HCT: 45 % (ref 39.0–52.0)
Hemoglobin: 14.2 g/dL (ref 13.0–17.0)
Immature Granulocytes: 0 %
Lymphocytes Relative: 24 %
Lymphs Abs: 1.7 10*3/uL (ref 0.7–4.0)
MCH: 21.3 pg — ABNORMAL LOW (ref 26.0–34.0)
MCHC: 31.6 g/dL (ref 30.0–36.0)
MCV: 67.6 fL — ABNORMAL LOW (ref 80.0–100.0)
Monocytes Absolute: 0.7 10*3/uL (ref 0.1–1.0)
Monocytes Relative: 10 %
Neutro Abs: 4.5 10*3/uL (ref 1.7–7.7)
Neutrophils Relative %: 63 %
Platelets: 401 10*3/uL — ABNORMAL HIGH (ref 150–400)
RBC: 6.66 MIL/uL — ABNORMAL HIGH (ref 4.22–5.81)
RDW: 18.4 % — ABNORMAL HIGH (ref 11.5–15.5)
WBC: 7.1 10*3/uL (ref 4.0–10.5)
nRBC: 0 % (ref 0.0–0.2)

## 2022-11-02 LAB — RESP PANEL BY RT-PCR (RSV, FLU A&B, COVID)  RVPGX2
Influenza A by PCR: NEGATIVE
Influenza B by PCR: NEGATIVE
Resp Syncytial Virus by PCR: NEGATIVE
SARS Coronavirus 2 by RT PCR: NEGATIVE

## 2022-11-02 LAB — TROPONIN I (HIGH SENSITIVITY): Troponin I (High Sensitivity): 9 ng/L (ref <18)

## 2022-11-02 MED ORDER — MAGNESIUM HYDROXIDE 400 MG/5ML PO SUSP: 30.0000 mL | Freq: Every day | ORAL | Status: DC | PRN

## 2022-11-02 MED ORDER — IPRATROPIUM-ALBUTEROL 0.5-2.5 (3) MG/3ML IN SOLN
3.0000 mL | RESPIRATORY_TRACT | Status: DC | PRN
  Administered 2022-11-02 – 2022-11-03 (×2): 3 mL via RESPIRATORY_TRACT
  Filled 2022-11-02 (×2): qty 3

## 2022-11-02 MED ORDER — SODIUM CHLORIDE 0.9 % IV SOLN: 500.0000 mg | INTRAVENOUS | Status: DC

## 2022-11-02 MED ORDER — PREDNISONE 20 MG PO TABS: 40.0000 mg | ORAL_TABLET | Freq: Every day | ORAL | Status: DC

## 2022-11-02 MED ORDER — ONDANSETRON HCL 4 MG/2ML IJ SOLN: 4.0000 mg | Freq: Four times a day (QID) | INTRAMUSCULAR | Status: DC | PRN

## 2022-11-02 MED ORDER — SODIUM CHLORIDE 0.9 % IV SOLN
2.0000 g | INTRAVENOUS | Status: DC
  Administered 2022-11-02: 2 g via INTRAVENOUS
  Filled 2022-11-02: qty 20

## 2022-11-02 MED ORDER — ADULT MULTIVITAMIN W/MINERALS CH: 1.0000 | ORAL_TABLET | Freq: Every day | ORAL | Status: DC

## 2022-11-02 MED ORDER — TRAZODONE HCL 50 MG PO TABS
25.0000 mg | ORAL_TABLET | Freq: Every evening | ORAL | Status: DC | PRN
  Filled 2022-11-02: qty 1

## 2022-11-02 MED ORDER — GUAIFENESIN ER 600 MG PO TB12
600.0000 mg | ORAL_TABLET | Freq: Two times a day (BID) | ORAL | Status: DC
  Administered 2022-11-03: 600 mg via ORAL
  Filled 2022-11-02: qty 1

## 2022-11-02 MED ORDER — SODIUM CHLORIDE 0.9 % IV SOLN
500.0000 mg | INTRAVENOUS | Status: DC
  Administered 2022-11-02: 500 mg via INTRAVENOUS
  Filled 2022-11-02: qty 5

## 2022-11-02 MED ORDER — SODIUM CHLORIDE 0.9 % IV BOLUS (SEPSIS)
1000.0000 mL | Freq: Once | INTRAVENOUS | Status: AC
  Administered 2022-11-03: 1000 mL via INTRAVENOUS

## 2022-11-02 MED ORDER — IPRATROPIUM-ALBUTEROL 0.5-2.5 (3) MG/3ML IN SOLN: 3.0000 mL | Freq: Four times a day (QID) | RESPIRATORY_TRACT | Status: DC

## 2022-11-02 MED ORDER — IOHEXOL 350 MG/ML SOLN
75.0000 mL | Freq: Once | INTRAVENOUS | Status: AC | PRN
  Administered 2022-11-02: 75 mL via INTRAVENOUS

## 2022-11-02 MED ORDER — SODIUM CHLORIDE 0.9 % IV SOLN: 2.0000 g | INTRAVENOUS | Status: DC

## 2022-11-02 MED ORDER — HYDROCOD POLI-CHLORPHE POLI ER 10-8 MG/5ML PO SUER: 5.0000 mL | Freq: Two times a day (BID) | ORAL | Status: DC | PRN

## 2022-11-02 MED ORDER — METHYLPREDNISOLONE SODIUM SUCC 40 MG IJ SOLR
40.0000 mg | Freq: Two times a day (BID) | INTRAMUSCULAR | Status: DC
  Administered 2022-11-03: 40 mg via INTRAVENOUS
  Filled 2022-11-02: qty 1

## 2022-11-02 MED ORDER — FAMOTIDINE 20 MG PO TABS: 20.0000 mg | ORAL_TABLET | Freq: Every day | ORAL | Status: DC

## 2022-11-02 MED ORDER — ACETAMINOPHEN 325 MG PO TABS: 650.0000 mg | ORAL_TABLET | Freq: Four times a day (QID) | ORAL | Status: DC | PRN

## 2022-11-02 MED ORDER — ONDANSETRON HCL 4 MG PO TABS: 4.0000 mg | ORAL_TABLET | Freq: Four times a day (QID) | ORAL | Status: DC | PRN

## 2022-11-02 MED ORDER — ACETAMINOPHEN 650 MG RE SUPP: 650.0000 mg | Freq: Four times a day (QID) | RECTAL | Status: DC | PRN

## 2022-11-02 MED ORDER — SODIUM CHLORIDE 0.9 % IV SOLN: INTRAVENOUS | Status: DC

## 2022-11-02 MED ORDER — ENOXAPARIN SODIUM 40 MG/0.4ML IJ SOSY: 40.0000 mg | PREFILLED_SYRINGE | INTRAMUSCULAR | Status: DC

## 2022-11-02 MED ORDER — SODIUM CHLORIDE 0.9 % IV BOLUS (SEPSIS)
250.0000 mL | Freq: Once | INTRAVENOUS | Status: AC
  Administered 2022-11-03: 250 mL via INTRAVENOUS

## 2022-11-02 NOTE — H&P (Signed)
[place]   PATIENT NAME: Colton Ford    MR#:  [id number]  DATE OF BIRTH:  12-30-1960  DATE OF ADMISSION:  11/02/2022  PRIMARY CARE PHYSICIAN: Tanda Rockers, MD   Patient is coming from: Home  REQUESTING/REFERRING PHYSICIAN: Linus Galas, MD   CHIEF COMPLAINT:   Chief Complaint  Patient presents with   Shortness of Breath    HISTORY OF PRESENT ILLNESS:  BRON SNELLINGS is a 62 y.o. African-American male with medical history significant for asthma/COPD and tobacco abuse, who presented to the ER with acute onset of worsening dyspnea and cough over the last 2 to 3 weeks.  He has been expectorating greenish sputum and later on clear sputum.  He admitted to associated wheezing.  Denies any fever or chills.  No nausea or vomiting or abdominal pain.  No chest pain or palpitations.  Continues to smoke a couple cigarettes per day.  He was noted to be hypoxemic with pulse currently of 89% on room air.  He denies any dysuria, oliguria or hematuria or flank pain.  ED Course: When he came to the ER BP was 165/93 with a pulse symmetry of 97% on 2 L O2 by nasal cannula and later on respiratory rate was up to 25 and heart rate has been up to 103.  Labs revealed mild hyponatremia 133 and CBC showed hemoglobin 11.8 hematocrit 36.7 with platelets of 395.  Influenza antigens, COVID 19 PCR came back negative as well as RSV PCR. EKG as reviewed by me : EKG showed normal sinus rhythm with a rate of 92 with left axis deviation, left anterior fascicular block and Q waves anteroseptally. Imaging: Portable x-ray showed no acute cardiopulmonary disease.  Chest CTA revealed no evidence for pulmonary embolism however showed patchy opacity in the lingula likely infectious/inflammatory and minimal secretion in the right mainstem bronchus.  The patient was given IV Rocephin and Zithromax as well as DuoNeb here.  He was also given IV Solu-Medrol and IV magnesium sulfate in addition to albuterol  nebulizer by EMS en route to the hospital.  He will be admitted to a medical telemetry bed for further evaluation and management. PAST MEDICAL HISTORY:   Past Medical History:  Diagnosis Date   Agitation    Asthma    Mild anemia   COPD Ongoing tobacco abuse  PAST SURGICAL HISTORY:  History reviewed. No pertinent surgical history.  SOCIAL HISTORY:   Social History   Tobacco Use   Smoking status: Some Days    Packs/day: 0.20    Years: 30.00    Total pack years: 6.00    Types: Cigarettes   Smokeless tobacco: Never  Substance Use Topics   Alcohol use: No    Alcohol/week: 0.0 standard drinks of alcohol    FAMILY HISTORY:   Family History  Problem Relation Age of Onset   Asthma Sister    Heart disease Sister    Heart disease Brother     DRUG ALLERGIES:  No Known Allergies  REVIEW OF SYSTEMS:   ROS As per history of present illness. All pertinent systems were reviewed above. Constitutional, HEENT, cardiovascular, respiratory, GI, GU, musculoskeletal, neuro, psychiatric, endocrine, integumentary and hematologic systems were reviewed and are otherwise negative/unremarkable except for positive findings mentioned above in the HPI.   MEDICATIONS AT HOME:   Prior to Admission medications   Medication Sig Start Date End Date Taking? Authorizing Provider  albuterol (PROVENTIL) (2.5 MG/3ML) 0.083% nebulizer solution Use 1 vial via  nebulizer every 6 (six) hours as needed for wheezing or shortness of breath. 08/08/22 10/19/22  Zigmund Daniel., MD  albuterol (VENTOLIN HFA) 108 (90 Base) MCG/ACT inhaler Inhale 2 puffs into the lungs every 6 (six) hours as needed for wheezing or shortness of breath. 08/08/22   Zigmund Daniel., MD  Multiple Vitamin (MULTIVITAMIN) tablet Take 1 tablet by mouth daily.    [provider]  nicotine (NICODERM CQ - DOSED IN MG/24 HOURS) 14 mg/24hr patch Place 1 patch (14 mg total) onto the skin daily as needed (smoking cessation).  08/08/22   Zigmund Daniel., MD  raNITIdine HCl (ACID REDUCER PO) Take 2 tablets by mouth daily as needed (acid reflux).    [provider]      VITAL SIGNS:  Blood pressure (!) 144/85, pulse 75, temperature 97.9 F (36.6 C), temperature source Oral, resp. rate 19, height 6' (1.829 m), weight 70.3 kg, SpO2 100 %.  PHYSICAL EXAMINATION:  Physical Exam  GENERAL:  62 y.o.-year-old patient lying in the bed with no acute distress.  EYES: Pupils equal, round, reactive to light and accommodation. No scleral icterus. Extraocular muscles intact.  HEENT: Head atraumatic, normocephalic. Oropharynx and nasopharynx clear.  NECK:  Supple, no jugular venous distention. No thyroid enlargement, no tenderness.  LUNGS: Diffuse expiratory wheezes with tight expiratory airflow and harsh vesicular breathing as well as left midlung zone crackles.. No use of accessory muscles of respiration.  CARDIOVASCULAR: Regular rate and rhythm, S1, S2 normal. No murmurs, rubs, or gallops.  ABDOMEN: Soft, nondistended, nontender. Bowel sounds present. No organomegaly or mass.  EXTREMITIES: No pedal edema, cyanosis, or clubbing.  NEUROLOGIC: Cranial nerves II through XII are intact. Muscle strength 5/5 in all extremities. Sensation intact. Gait not checked.  PSYCHIATRIC: The patient is alert and oriented x 3.  Normal affect and good eye contact. SKIN: No obvious rash, lesion, or ulcer.   LABORATORY PANEL:   CBC Recent Labs  Lab 11/03/22 0049  WBC 4.3  HGB 11.8*  HCT 36.7*  PLT 395   ------------------------------------------------------------------------------------------------------------------  Chemistries  Recent Labs  Lab 11/02/22 1700 11/03/22 0049  NA 138 133*  K 3.8 4.3  CL 97* 102  CO2 28 23  GLUCOSE 91 136*  BUN 13 12  CREATININE 1.11 0.91  CALCIUM 9.3 8.1*  AST 28  --   ALT 17  --   ALKPHOS 64  --   BILITOT 0.3  --     ------------------------------------------------------------------------------------------------------------------  Cardiac Enzymes No results for input(s): "TROPONINI" in the last 168 hours. ------------------------------------------------------------------------------------------------------------------  RADIOLOGY:  CT Angio Chest PE W and/or Wo Contrast  Result Date: 11/02/2022 CLINICAL DATA:  Shortness of breath EXAM: CT ANGIOGRAPHY CHEST WITH CONTRAST TECHNIQUE: Multidetector CT imaging of the chest was performed using the standard protocol during bolus administration of intravenous contrast. Multiplanar CT image reconstructions and MIPs were obtained to evaluate the vascular anatomy. RADIATION DOSE REDUCTION: This exam was performed according to the departmental dose-optimization program which includes automated exposure control, adjustment of the mA and/or kV according to patient size and/or use of iterative reconstruction technique. CONTRAST:  83mL OMNIPAQUE IOHEXOL 350 MG/ML SOLN COMPARISON:  Chest x-ray same day. FINDINGS: Cardiovascular: Satisfactory opacification of the pulmonary arteries to the segmental level. No evidence of pulmonary embolism. Normal heart size. No pericardial effusion. Mediastinum/Nodes: No enlarged mediastinal, hilar, or axillary lymph nodes. Thyroid gland, trachea, and esophagus demonstrate no significant findings. Lungs/Pleura: Minimal emphysematous changes are present. There is some minimal strandy  and patchy opacities in the lingula. Lungs are otherwise clear. No pleural effusion or pneumothorax. There are minimal secretions in the right mainstem bronchus. Upper Abdomen: Rounded right lobe of the liver is too small to characterize, likely a cyst or hemangioma. No acute findings. Musculoskeletal: No chest wall abnormality. No acute or significant osseous findings. Review of the MIP images confirms the above findings. IMPRESSION: 1. No evidence for pulmonary embolism.  2. Minimal strandy and patchy opacities in the lingula, likely infectious/inflammatory. 3. Minimal secretions in the right mainstem bronchus. Electronically Signed   By: Ronney Asters M.D.   On: 11/02/2022 21:50   DG CHEST PORT 1 VIEW  Result Date: 11/02/2022 CLINICAL DATA:  Shortness of breath EXAM: PORTABLE CHEST 1 VIEW COMPARISON:  Chest x-ray 10/16/2018 FINDINGS: The heart size and mediastinal contours are within normal limits. Both lungs are clear. The visualized skeletal structures are unremarkable. IMPRESSION: No active disease. Electronically Signed   By: Ronney Asters M.D.   On: 11/02/2022 19:34      IMPRESSION AND PLAN:  Assessment and Plan: * Sepsis due to pneumonia [facility]) - This is secondary to left lingular pneumonia. - The patient will be admitted to a medical telemetry bed. - Will continue antibiotic therapy with IV Rocephin and Zithromax. - We will follow blood cultures. - He will be given bronchodilator therapy as well as mucolytic therapy.  COPD with acute exacerbation ([place]) - This associated with asthma exacerbation. - Will be continued on steroid therapy with IV Solu-Medrol. - We will continue bronchodilator therapy with DuoNebs 4 times daily and every 4 hours as needed pain - Antibiotic therapy will be provided as above. - Mucolytic therapy will be provided as above.  Hypoxia - This is secondary to his COPD exacerbation and pneumonia. - O2 protocol will be followed.  Uncontrolled hypertension - Will be placed on as needed IV labetalol. - We will add 12.5 mg of p.o. HCTZ.  GERD without esophagitis - We will continue H2 blocker therapy.  Tobacco abuse I counseled him for smoking cessation and he will receive further counseling here.     DVT prophylaxis: Lovenox.  Advanced Care Planning:  Code Status: full code.  Family Communication:  The plan of care was discussed in details with the patient (and family). I answered all questions. The patient agreed to  proceed with the above mentioned plan. Further management will depend upon hospital course. Disposition Plan: Back to previous home environment Consults called: none.  All the records are reviewed and case discussed with ED provider.  Status is: Inpatient    At the time of the admission, it appears that the appropriate admission status for this patient is inpatient.  This is judged to be reasonable and necessary in order to provide the required intensity of service to ensure the patient's safety given the presenting symptoms, physical exam findings and initial radiographic and laboratory data in the context of comorbid conditions.  The patient requires inpatient status due to high intensity of service, high risk of further deterioration and high frequency of surveillance required.  I certify that at the time of admission, it is my clinical judgment that the patient will require inpatient hospital care extending more than 2 midnights.                            Dispo: The patient is from: Home              Anticipated  d/c is to: Home              Patient currently is not medically stable to d/c.              Difficult to place patient: No  Christel Mormon M.D on 11/03/2022 at 3:56 AM  [facility]   From 7 PM-7 AM, contact night-coverage [email or web address]  CC: Primary care physician; Tanda Rockers, MD

## 2022-11-02 NOTE — ED Provider Notes (Signed)
[facility] Provider Note   CSN: [id number] Arrival date & time: 11/02/22  1702    History  Chief Complaint  Patient presents with   Shortness of Breath    Colton Ford is a 62 y.o. male with PMH HTN, COPD/asthma, continued smoking brought in by EMS for shortness of breath.  He states that he started experiencing worsening shortness of breath about 2 to 3 weeks ago.  He does not remember any clear precipitating events including viral illnesses, chest pain, medication changes before the start of the symptoms.  He says that he has been using his albuterol inhaler and nebulizer liberally over the past 2 weeks but that he still feels short of breath moving around.  He also endorses more fatigue and loss of appetite.  He denies any exertional chest pain, nausea or vomiting, headaches, loss of consciousness, dizziness. He states that he did see his pulmonologist who started him on a new daily inhaler sometime last year (looks like Breztri, though unsure if this has been consistently dispensed).  He has had multiple prior hospitalizations for asthma/COPD exacerbation.  He called EMS today because his dyspnea had worsened and he had used all of his available albuterol treatments.  EMS gave 50 mg of albuterol, 0.5 mg of Atrovent, 2 mg of mag, and one-time dose of IV Solu-Medrol 125.  He states that his symptoms improved significantly after he received treatment from EMS.   Home Medications Prior to Admission medications   Medication Sig Start Date End Date Taking? Authorizing Provider  albuterol (PROVENTIL) (2.5 MG/3ML) 0.083% nebulizer solution Use 1 vial via nebulizer every 6 (six) hours as needed for wheezing or shortness of breath. 08/08/22 10/19/22  Elodia Florence., MD  albuterol (VENTOLIN HFA) 108 (90 Base) MCG/ACT inhaler Inhale 2 puffs into the lungs every 6 (six) hours as needed for wheezing or shortness of breath. 08/08/22   Elodia Florence., MD  Multiple Vitamin (MULTIVITAMIN) tablet Take 1 tablet by mouth daily.    [provider]  nicotine (NICODERM CQ - DOSED IN MG/24 HOURS) 14 mg/24hr patch Place 1 patch (14 mg total) onto the skin daily as needed (smoking cessation). 08/08/22   Elodia Florence., MD  raNITIdine HCl (ACID REDUCER PO) Take 2 tablets by mouth daily as needed (acid reflux).    [provider]      Allergies    Patient has no known allergies.    Review of Systems   See HPI  Physical Exam Updated Vital Signs BP (!) 158/89   Pulse 94   Temp 98.7 F (37.1 C) (Oral)   Resp (!) 21   Ht 6' (1.829 m)   Wt 70.3 kg   SpO2 98%   BMI 21.02 kg/m  Physical Exam Constitutional:      General: He is not in acute distress. Cardiovascular:     Rate and Rhythm: Normal rate and regular rhythm.     Heart sounds: Normal heart sounds.  Pulmonary:     Effort: Pulmonary effort is normal. No accessory muscle usage or respiratory distress.     Breath sounds: Wheezing (mild diffuse expiratory wheezing) present. No rhonchi or rales.  Chest:     Chest wall: No mass or tenderness.  Skin:    General: Skin is warm and dry.  Neurological:     General: No focal deficit present.     Mental Status: He is alert and oriented to  person, place, and time.  Psychiatric:        Mood and Affect: Mood normal.        Behavior: Behavior normal.     ED Results / Procedures / Treatments   Labs (all labs ordered are listed, but only abnormal results are displayed) Labs Reviewed  COMPREHENSIVE METABOLIC PANEL - Abnormal; Notable for the following components:      Result Value   Chloride 97 (*)    All other components within normal limits  CBC WITH DIFFERENTIAL/PLATELET - Abnormal; Notable for the following components:   RBC 6.66 (*)    MCV 67.6 (*)    MCH 21.3 (*)    RDW 18.4 (*)    Platelets 401 (*)    All other components within normal limits  RESP PANEL BY RT-PCR (RSV, FLU A&B, COVID)   [id number]  BRAIN NATRIURETIC PEPTIDE  TROPONIN I (HIGH SENSITIVITY)  TROPONIN I (HIGH SENSITIVITY)    EKG EKG Interpretation  Date/Time:  Thursday November 02 2022 17:10:55 EST Ventricular Rate:  92 PR Interval:  134 QRS Duration: 81 QT Interval:  368 QTC Calculation: 456 R Axis:   -56 Text Interpretation: Sinus rhythm LAD, consider left anterior fascicular block Anteroseptal infarct, old No significant change since last tracing Confirmed by Gareth Morgan [phone]) on 11/02/2022 6:25:48 PM  Radiology CT Angio Chest PE W and/or Wo Contrast  Result Date: 11/02/2022 CLINICAL DATA:  Shortness of breath EXAM: CT ANGIOGRAPHY CHEST WITH CONTRAST TECHNIQUE: Multidetector CT imaging of the chest was performed using the standard protocol during bolus administration of intravenous contrast. Multiplanar CT image reconstructions and MIPs were obtained to evaluate the vascular anatomy. RADIATION DOSE REDUCTION: This exam was performed according to the departmental dose-optimization program which includes automated exposure control, adjustment of the mA and/or kV according to patient size and/or use of iterative reconstruction technique. CONTRAST:  73m OMNIPAQUE IOHEXOL 350 MG/ML SOLN COMPARISON:  Chest x-ray same day. FINDINGS: Cardiovascular: Satisfactory opacification of the pulmonary arteries to the segmental level. No evidence of pulmonary embolism. Normal heart size. No pericardial effusion. Mediastinum/Nodes: No enlarged mediastinal, hilar, or axillary lymph nodes. Thyroid gland, trachea, and esophagus demonstrate no significant findings. Lungs/Pleura: Minimal emphysematous changes are present. There is some minimal strandy and patchy opacities in the lingula. Lungs are otherwise clear. No pleural effusion or pneumothorax. There are minimal secretions in the right mainstem bronchus. Upper Abdomen: Rounded right lobe of the liver is too small to characterize, likely a cyst or hemangioma. No acute findings.  Musculoskeletal: No chest wall abnormality. No acute or significant osseous findings. Review of the MIP images confirms the above findings. IMPRESSION: 1. No evidence for pulmonary embolism. 2. Minimal strandy and patchy opacities in the lingula, likely infectious/inflammatory. 3. Minimal secretions in the right mainstem bronchus. Electronically Signed   By: ARonney AstersM.D.   On: 11/02/2022 21:50   DG CHEST PORT 1 VIEW  Result Date: 11/02/2022 CLINICAL DATA:  Shortness of breath EXAM: PORTABLE CHEST 1 VIEW COMPARISON:  Chest x-ray 10/16/2018 FINDINGS: The heart size and mediastinal contours are within normal limits. Both lungs are clear. The visualized skeletal structures are unremarkable. IMPRESSION: No active disease. Electronically Signed   By: ARonney AstersM.D.   On: 11/02/2022 19:34    Medications Ordered in ED Medications  ipratropium-albuterol (DUONEB) 0.5-2.5 (3) MG/3ML nebulizer solution 3 mL (has no administration in time range)  cefTRIAXone (ROCEPHIN) 2 g in sodium chloride 0.9 % 100 mL IVPB (2 g Intravenous New Bag/Given 11/02/22  2223)  azithromycin (ZITHROMAX) 500 mg in sodium chloride 0.9 % 250 mL IVPB (has no administration in time range)  iohexol (OMNIPAQUE) 350 MG/ML injection 75 mL (75 mLs Intravenous Contrast Given 11/02/22 2135)    ED Course/ Medical Decision Making/ A&P  1}                          Medical Decision Making Amount and/or Complexity of Data Reviewed Labs: ordered. Radiology: ordered.  Risk Prescription drug management.   62 year old male with history of poorly controlled COPD and asthma, ongoing smoking presenting with subacute dyspnea.  Feel that most likely explanation for his presentation with subacute dyspnea was inadequately controlled obstructive lung disease as he has had multiple prior hospitalizations for this and inconsistently is able to access his inhalers. He was initially hypoxic on room air when he got to the ED but is satting well on 2 L.   Symptoms significantly improved after initial treatment with EMS.  Low concern for ACS at this time, he denies any anginal chest pain and EKG was consistent with prior tracings, troponins pending.  Not volume overloaded but he does endorse orthopnea and PND, BNP pending.    Update: he is still hypoxic on room air satting in high 80s to low 90s despite initial treatment for COPD/asthma exacerbation. He typically is able to remain on room air after initial stabilization per chart based on his prior ED presentations for dyspnea.  Wonder if he also has other concurrent process going on.  He did get CTA due to persistent hypoxia which was negative for PE but did show possible left lingular opacity which could reflect pneumonia.  CAP coverage started.  Given persistent hypoxia, he likely will need admission for further management.  He will also need inhalers supplied before he can be discharged safely and possibly will need oxygen at discharge as well.  Final Clinical Impression(s) / ED Diagnoses Final diagnoses:  None    Rx / DC Orders ED Discharge Orders     None         Linus Galas, MD 11/02/22 2224    Gareth Morgan, MD 11/26/22 1203

## 2022-11-02 NOTE — ED Triage Notes (Signed)
Pt to ED via EMS from home c/o SOB. Pt has hx of COPD. Pt states he has been SOB for a few days and has used all of his albuterol treatments and decided to call EMS. EMS gave 15MG  of albuterol, 0.5mg  of atrovent, 2mg  of mag and 125mg  of solumedrol. Pt Aox4 in triage c/o SOB.

## 2022-11-03 ENCOUNTER — Other Ambulatory Visit (HOSPITAL_COMMUNITY): Payer: Self-pay

## 2022-11-03 DIAGNOSIS — I10 Essential (primary) hypertension: Secondary | ICD-10-CM

## 2022-11-03 DIAGNOSIS — J441 Chronic obstructive pulmonary disease with (acute) exacerbation: Secondary | ICD-10-CM

## 2022-11-03 DIAGNOSIS — J189 Pneumonia, unspecified organism: Secondary | ICD-10-CM

## 2022-11-03 DIAGNOSIS — R0902 Hypoxemia: Secondary | ICD-10-CM

## 2022-11-03 DIAGNOSIS — A419 Sepsis, unspecified organism: Secondary | ICD-10-CM | POA: Diagnosis not present

## 2022-11-03 DIAGNOSIS — K219 Gastro-esophageal reflux disease without esophagitis: Secondary | ICD-10-CM

## 2022-11-03 DIAGNOSIS — Z720 Tobacco use: Secondary | ICD-10-CM | POA: Insufficient documentation

## 2022-11-03 LAB — BASIC METABOLIC PANEL
Anion gap: 8 (ref 5–15)
BUN: 12 mg/dL (ref 8–23)
CO2: 23 mmol/L (ref 22–32)
Calcium: 8.1 mg/dL — ABNORMAL LOW (ref 8.9–10.3)
Chloride: 102 mmol/L (ref 98–111)
Creatinine, Ser: 0.91 mg/dL (ref 0.61–1.24)
GFR, Estimated: >60 mL/min (ref >60)
Glucose, Bld: 136 mg/dL — ABNORMAL HIGH (ref 70–99)
Potassium: 4.3 mmol/L (ref 3.5–5.1)
Sodium: 133 mmol/L — ABNORMAL LOW (ref 135–145)

## 2022-11-03 LAB — PROTIME-INR
INR: 1.1 (ref 0.8–1.2)
Prothrombin Time: 13.8 seconds (ref 11.4–15.2)

## 2022-11-03 LAB — CBC
HCT: 36.7 % — ABNORMAL LOW (ref 39.0–52.0)
Hemoglobin: 11.8 g/dL — ABNORMAL LOW (ref 13.0–17.0)
MCH: 21.5 pg — ABNORMAL LOW (ref 26.0–34.0)
MCHC: 32.2 g/dL (ref 30.0–36.0)
MCV: 66.7 fL — ABNORMAL LOW (ref 80.0–100.0)
Platelets: 395 10*3/uL (ref 150–400)
RBC: 5.5 MIL/uL (ref 4.22–5.81)
RDW: 17 % — ABNORMAL HIGH (ref 11.5–15.5)
WBC: 4.3 10*3/uL (ref 4.0–10.5)
nRBC: 0 % (ref 0.0–0.2)

## 2022-11-03 LAB — CORTISOL-AM, BLOOD
Cortisol - AM: 16.8 ug/dL (ref 6.7–22.6)
Cortisol - AM: 4.3 ug/dL — ABNORMAL LOW (ref 6.7–22.6)

## 2022-11-03 LAB — HIV ANTIBODY (ROUTINE TESTING W REFLEX): HIV Screen 4th Generation wRfx: NONREACTIVE

## 2022-11-03 LAB — TROPONIN I (HIGH SENSITIVITY): Troponin I (High Sensitivity): 9 ng/L (ref <18)

## 2022-11-03 LAB — BRAIN NATRIURETIC PEPTIDE: B Natriuretic Peptide: 27.1 pg/mL (ref 0.0–100.0)

## 2022-11-03 LAB — C-REACTIVE PROTEIN
CRP: 0.6 mg/dL (ref <1.0)
CRP: 23.4 mg/dL — ABNORMAL HIGH (ref <1.0)

## 2022-11-03 LAB — PROCALCITONIN: Procalcitonin: <0.1 ng/mL

## 2022-11-03 MED ORDER — NICOTINE 14 MG/24HR TD PT24
14.0000 mg | MEDICATED_PATCH | Freq: Every day | TRANSDERMAL | 0 refills | Status: DC | PRN
  Filled 2022-11-03 (×2): qty 28, 28d supply, fill #0

## 2022-11-03 MED ORDER — SODIUM CHLORIDE 0.9 % IV SOLN: 2.0000 g | INTRAVENOUS | Status: DC

## 2022-11-03 MED ORDER — HYDROCHLOROTHIAZIDE 12.5 MG PO TABS: 12.5000 mg | ORAL_TABLET | Freq: Every day | ORAL | Status: DC

## 2022-11-03 MED ORDER — TIOTROPIUM BROMIDE MONOHYDRATE 18 MCG IN CAPS
18.0000 ug | ORAL_CAPSULE | Freq: Every day | RESPIRATORY_TRACT | 0 refills | Status: DC
  Filled 2022-11-03 (×2): qty 30, 30d supply, fill #0

## 2022-11-03 MED ORDER — SODIUM CHLORIDE 0.9 % IV SOLN: 500.0000 mg | INTRAVENOUS | Status: DC

## 2022-11-03 MED ORDER — FLUTICASONE-SALMETEROL 115-21 MCG/ACT IN AERO
2.0000 | INHALATION_SPRAY | Freq: Two times a day (BID) | RESPIRATORY_TRACT | 0 refills | Status: DC
  Filled 2022-11-03: qty 12, 30d supply, fill #0
  Filled 2022-11-03: qty 8, 30d supply, fill #0

## 2022-11-03 MED ORDER — LABETALOL HCL 5 MG/ML IV SOLN: 20.0000 mg | INTRAVENOUS | Status: DC | PRN

## 2022-11-03 MED ORDER — PREDNISONE 5 MG PO TABS
ORAL_TABLET | ORAL | 0 refills | Status: DC
  Filled 2022-11-03 (×2): qty 65, 18d supply, fill #0

## 2022-11-03 MED ORDER — LEVOFLOXACIN 750 MG PO TABS
750.0000 mg | ORAL_TABLET | Freq: Every day | ORAL | 0 refills | Status: AC
  Filled 2022-11-03 (×2): qty 5, 5d supply, fill #0

## 2022-11-03 NOTE — Discharge Instructions (Signed)
Follow with Primary MD Tanda Rockers, MD in 7 days   Get CBC, CMP, 2 view Chest X ray -  checked next visit with your primary MD    Activity: As tolerated with Full fall precautions use walker/cane & assistance as needed  Disposition Home    Diet: Heart Healthy    Special Instructions: If you have smoked or chewed Tobacco  in the last 2 yrs please stop smoking, stop any regular Alcohol  and or any Recreational drug use.  On your next visit with your primary care physician please Get Medicines reviewed and adjusted.  Please request your Prim.MD to go over all Hospital Tests and Procedure/Radiological results at the follow up, please get all Hospital records sent to your Prim MD by signing hospital release before you go home.  If you experience worsening of your admission symptoms, develop shortness of breath, life threatening emergency, suicidal or homicidal thoughts you must seek medical attention immediately by calling 911 or calling your MD immediately  if symptoms less severe.  You Must read complete instructions/literature along with all the possible adverse reactions/side effects for all the Medicines you take and that have been prescribed to you. Take any new Medicines after you have completely understood and accpet all the possible adverse reactions/side effects.

## 2022-11-03 NOTE — ED Notes (Signed)
Pt requested breathing treatment.

## 2022-11-03 NOTE — ED Notes (Signed)
Pharmacy called and made aware pt doesn't want to stay and wait for medications. Pharmacy stated pt can come back and go to any out patient pharmacy to get meds at another time. Pt made aware and walked out of ED

## 2022-11-03 NOTE — Assessment & Plan Note (Signed)
-   We will continue H2 blocker therapy. 

## 2022-11-03 NOTE — Discharge Summary (Signed)
Colton Ford [id number] DOB: 23-May-1961 DOA: 11/02/2022  PCP: Nyoka Cowden, MD  Admit date: 11/02/2022  Discharge date: 11/03/2022  Admitted From: Home   Disposition:  Home   Recommendations for Outpatient Follow-up:   Follow up with PCP in 1-2 weeks  PCP Please obtain BMP/CBC, 2 view CXR in 1week,  (see Discharge instructions)   PCP Please follow up on the following pending results:    Home Health: None   Equipment/Devices: None  Consultations: None  Discharge Condition: Stable    CODE STATUS: Full    Diet Recommendation: Heart Healthy     Chief Complaint  Patient presents with   Shortness of Breath     Brief history of present illness from the day of admission and additional interim summary    62 y.o. African-American male with medical history significant for asthma/COPD and tobacco abuse, who presented to the ER with cough, wheezing and shortness of breath ongoing for 2 to 3 weeks he was diagnosed with COPD exacerbation and admitted to the hospital.                                                                 Hospital Course   Acute on chronic COPD exacerbation causing acute hypoxic respiratory failure.  No sepsis.  Patient was placed on IV steroids along with IV antibiotics and nebulizer treatments with quicker than expected resolution of his symptoms, he is currently symptom-free on room air, no wheezing and eager to go home.  Ambulatory pulse ox on room air is above 95%, he will be discharged home on oral steroid taper along with 5 days of Levaquin, CT chest noted.  He has been counseled to quit smoking and requested to follow-up with his PCP and pulmonologist within a week.  Request PCP and pulmonologist to repeat a two-view chest x-ray next visit.  Will benefit from close outpatient follow-up  and monitoring by his pulmonologist.   GERD.  Continue H2 blocker.  Smoking.  Counseled to quit.  NicoDerm was provided for 28 days.  Hypertension at the time of admission likely due to respiratory distress.  PCP to monitor and address based on serial readings.  Currently blood pressure stable.    Discharge diagnosis     Principal Problem:   Sepsis due to pneumonia [facility]) Active Problems:   COPD with acute exacerbation (HCC)   Uncontrolled hypertension   Hypoxia   Tobacco abuse   GERD without esophagitis   Community acquired pneumonia of left lung    Discharge instructions    Discharge Instructions     Diet - low sodium heart healthy   Complete by: As directed    Discharge instructions   Complete by: As directed    Follow with Primary MD Nyoka Cowden, MD in 7 days  Get CBC, CMP, 2 view Chest X ray -  checked next visit with your primary MD    Activity: As tolerated with Full fall precautions use walker/cane & assistance as needed  Disposition Home    Diet: Heart Healthy    Special Instructions: If you have smoked or chewed Tobacco  in the last 2 yrs please stop smoking, stop any regular Alcohol  and or any Recreational drug use.  On your next visit with your primary care physician please Get Medicines reviewed and adjusted.  Please request your Prim.MD to go over all Hospital Tests and Procedure/Radiological results at the follow up, please get all Hospital records sent to your Prim MD by signing hospital release before you go home.  If you experience worsening of your admission symptoms, develop shortness of breath, life threatening emergency, suicidal or homicidal thoughts you must seek medical attention immediately by calling 911 or calling your MD immediately  if symptoms less severe.  You Must read complete instructions/literature along with all the possible adverse reactions/side effects for all the Medicines you take and that have been prescribed to you.  Take any new Medicines after you have completely understood and accpet all the possible adverse reactions/side effects.   Increase activity slowly   Complete by: As directed        Discharge Medications   Allergies as of 11/03/2022   No Known Allergies      Medication List     TAKE these medications    ACID REDUCER PO Take 2 tablets by mouth daily as needed (acid reflux).   albuterol (2.5 MG/3ML) 0.083% nebulizer solution Commonly known as: PROVENTIL Use 1 vial via nebulizer every 6 (six) hours as needed for wheezing or shortness of breath.   albuterol 108 (90 Base) MCG/ACT inhaler Commonly known as: VENTOLIN HFA Inhale 2 puffs into the lungs every 6 (six) hours as needed for wheezing or shortness of breath.   fluticasone-salmeterol 115-21 MCG/ACT inhaler Commonly known as: Advair HFA Inhale 2 puffs into the lungs 2 (two) times daily.   levofloxacin 750 MG tablet Commonly known as: Levaquin Take 1 tablet (750 mg total) by mouth daily for 5 days.   multivitamin tablet Take 1 tablet by mouth daily.   nicotine 14 mg/24hr patch Commonly known as: NICODERM CQ - dosed in mg/24 hours Place 1 patch (14 mg total) onto the skin daily as needed (smoking cessation).   predniSONE 5 MG tablet Commonly known as: DELTASONE Label  & dispense according to the schedule below. take 8 Pills PO for 3 days, 6 Pills PO for 3 days, 4 Pills PO for 3 days, 2 Pills PO for 3 days, 1 Pills PO for 3 days, 1/2 Pill  PO for 3 days then STOP. Total 65 pills.   tiotropium 18 MCG inhalation capsule Commonly known as: Spiriva HandiHaler Place 1 capsule (18 mcg total) into inhaler and inhale daily.         Follow-up Information     Tanda Rockers, MD. Schedule an appointment as soon as possible for a visit in 1 week(s).   Specialty: Pulmonary Disease Why: Also follow-up with your pulmonologist within a week Contact information: [place]   [id number] [phone]                 Major procedures and Radiology Reports - PLEASE review detailed and final reports thoroughly  -       CT Angio Chest PE W and/or  Wo Contrast  Result Date: 11/02/2022 CLINICAL DATA:  Shortness of breath EXAM: CT ANGIOGRAPHY CHEST WITH CONTRAST TECHNIQUE: Multidetector CT imaging of the chest was performed using the standard protocol during bolus administration of intravenous contrast. Multiplanar CT image reconstructions and MIPs were obtained to evaluate the vascular anatomy. RADIATION DOSE REDUCTION: This exam was performed according to the departmental dose-optimization program which includes automated exposure control, adjustment of the mA and/or kV according to patient size and/or use of iterative reconstruction technique. CONTRAST:  32mL OMNIPAQUE IOHEXOL 350 MG/ML SOLN COMPARISON:  Chest x-ray same day. FINDINGS: Cardiovascular: Satisfactory opacification of the pulmonary arteries to the segmental level. No evidence of pulmonary embolism. Normal heart size. No pericardial effusion. Mediastinum/Nodes: No enlarged mediastinal, hilar, or axillary lymph nodes. Thyroid gland, trachea, and esophagus demonstrate no significant findings. Lungs/Pleura: Minimal emphysematous changes are present. There is some minimal strandy and patchy opacities in the lingula. Lungs are otherwise clear. No pleural effusion or pneumothorax. There are minimal secretions in the right mainstem bronchus. Upper Abdomen: Rounded right lobe of the liver is too small to characterize, likely a cyst or hemangioma. No acute findings. Musculoskeletal: No chest wall abnormality. No acute or significant osseous findings. Review of the MIP images confirms the above findings. IMPRESSION: 1. No evidence for pulmonary embolism. 2. Minimal strandy and patchy opacities in the lingula, likely infectious/inflammatory. 3. Minimal secretions in the right mainstem bronchus. Electronically Signed   By: Ronney Asters M.D.   On: 11/02/2022 21:50   DG CHEST PORT 1 VIEW  Result Date: 11/02/2022 CLINICAL DATA:  Shortness of breath EXAM: PORTABLE CHEST 1 VIEW COMPARISON:  Chest x-ray 10/16/2018 FINDINGS: The heart size and mediastinal contours are within normal limits. Both lungs are clear. The visualized skeletal structures are unremarkable. IMPRESSION: No active disease. Electronically Signed   By: Ronney Asters M.D.   On: 11/02/2022 19:34    Micro Results     Recent Results (from the past 240 hour(s))  Resp panel by RT-PCR (RSV, Flu A&B, Covid) Anterior Nasal Swab     Status: None   Collection Time: 11/02/22  6:28 PM   Specimen: Anterior Nasal Swab  Result Value Ref Range Status   SARS Coronavirus 2 by RT PCR NEGATIVE NEGATIVE Final   Influenza A by PCR NEGATIVE NEGATIVE Final   Influenza B by PCR NEGATIVE NEGATIVE Final    Comment: (NOTE) The Xpert Xpress SARS-CoV-2/FLU/RSV plus assay is intended as an aid in the diagnosis of influenza from Nasopharyngeal swab specimens and should not be used as a sole basis for treatment. Nasal washings and aspirates are unacceptable for Xpert Xpress SARS-CoV-2/FLU/RSV testing.  Fact Sheet for Patients: EntrepreneurPulse.com.au  Fact Sheet for Healthcare Providers: IncredibleEmployment.be  This test is not yet approved or cleared by the [place] FDA and has been authorized for detection and/or diagnosis of SARS-CoV-2 by FDA under an Emergency Use Authorization (EUA). This EUA will remain in effect (meaning this test can be used) for the duration of the COVID-19 declaration under Section 564(b)(1) of the Act, 21 U.S.C. section 360bbb-3(b)(1), unless the authorization is terminated or revoked.     Resp Syncytial Virus by PCR NEGATIVE NEGATIVE Final    Comment: (NOTE) Fact Sheet for Patients: EntrepreneurPulse.com.au  Fact Sheet for Healthcare  Providers: IncredibleEmployment.be  This test is not yet approved or cleared by the [place] FDA and has been authorized for detection and/or diagnosis of SARS-CoV-2 by FDA under an Emergency Use Authorization (EUA). This EUA will remain in  effect (meaning this test can be used) for the duration of the COVID-19 declaration under Section 564(b)(1) of the Act, 21 U.S.C. section 360bbb-3(b)(1), unless the authorization is terminated or revoked.  Performed at [facility], [place] [address], [place], [place] [address]     Today   Subjective    Colton Ford today has no headache,no chest abdominal pain,no new weakness tingling or numbness, feels much better wants to go home today.    Objective   Blood pressure 132/71, pulse 71, temperature 97.8 F (36.6 C), temperature source Oral, resp. rate 18, height 6' (1.829 m), weight 70.3 kg, SpO2 96 %.   Intake/Output Summary (Last 24 hours) at 11/03/2022 0830 Last data filed at 11/03/2022 0536 Gross per 24 hour  Intake 2599.41 ml  Output 1800 ml  Net 799.41 ml    Exam  Awake Alert, No new F.N deficits,    .AT,PERRAL Supple Neck,   Symmetrical Chest wall movement, Good air movement bilaterally, CTAB, no wheezing whatsoever RRR,No Gallops,   +ve B.Sounds, Abd Soft, Non tender,  No Cyanosis, Clubbing or edema    Data Review   Recent Labs  Lab 11/02/22 1700 11/03/22 0049  WBC 7.1 4.3  HGB 14.2 11.8*  HCT 45.0 36.7*  PLT 401* 395  MCV 67.6* 66.7*  MCH 21.3* 21.5*  MCHC 31.6 32.2  RDW 18.4* 17.0*  LYMPHSABS 1.7  --   MONOABS 0.7  --   EOSABS 0.2  --   BASOSABS 0.0  --     Recent Labs  Lab 11/02/22 1700 11/03/22 0049  NA 138 133*  K 3.8 4.3  CL 97* 102  CO2 28 23  ANIONGAP 13 8  GLUCOSE 91 136*  BUN 13 12  CREATININE 1.11 0.91  AST 28  --   ALT 17  --   ALKPHOS 64  --   BILITOT 0.3  --   ALBUMIN 3.9  --   CRP  --  23.4*  PROCALCITON  --  <0.10  INR  --  1.1  BNP  --  27.1   CALCIUM 9.3 8.1*     Total Time in preparing paper work, data evaluation and todays exam - 35 minutes  Signature  -    Lala Lund M.D on 11/03/2022 at 8:30 AM   -  To page go to [email or web address]

## 2022-11-03 NOTE — Assessment & Plan Note (Signed)
I counseled him for smoking cessation and he will receive further counseling here.

## 2022-11-03 NOTE — ED Notes (Signed)
Pt given d/c paperwork questions answered waiting for pharmacy to drop off meds

## 2022-11-03 NOTE — ED Notes (Signed)
Pharmacy called for meds.  

## 2022-11-03 NOTE — Assessment & Plan Note (Signed)
-   This is secondary to left lingular pneumonia. - The patient will be admitted to a medical telemetry bed. - Will continue antibiotic therapy with IV Rocephin and Zithromax. - We will follow blood cultures. - He will be given bronchodilator therapy as well as mucolytic therapy.

## 2022-11-03 NOTE — Assessment & Plan Note (Signed)
-   Will be placed on as needed IV labetalol. - We will add 12.5 mg of p.o. HCTZ.

## 2022-11-03 NOTE — Assessment & Plan Note (Signed)
-   This associated with asthma exacerbation. - Will be continued on steroid therapy with IV Solu-Medrol. - We will continue bronchodilator therapy with DuoNebs 4 times daily and every 4 hours as needed pain - Antibiotic therapy will be provided as above. - Mucolytic therapy will be provided as above.

## 2022-11-03 NOTE — ED Notes (Signed)
Pt ambulated in hallway on room air pt sats 97-100% HR 85 - MD notified

## 2022-11-03 NOTE — Assessment & Plan Note (Signed)
-   This is secondary to his COPD exacerbation and pneumonia. - O2 protocol will be followed.

## 2022-11-03 NOTE — ED Notes (Signed)
Pt AOX4, reports improved symptoms from yesterday. Call light within reach urinal emptied of 600cc clear yellow urine

## 2022-12-07 ENCOUNTER — Observation Stay (HOSPITAL_COMMUNITY): Payer: Medicaid Other

## 2022-12-07 ENCOUNTER — Emergency Department (HOSPITAL_COMMUNITY): Payer: Medicaid Other

## 2022-12-07 ENCOUNTER — Inpatient Hospital Stay (HOSPITAL_COMMUNITY)
Admission: EM | Admit: 2022-12-07 | Discharge: 2022-12-08 | DRG: 190 | Disposition: A | Discharge status: Home / Self Care | Payer: Medicaid Other | Attending: Internal Medicine | Admitting: Internal Medicine

## 2022-12-07 ENCOUNTER — Encounter (HOSPITAL_COMMUNITY): Payer: Self-pay | Admitting: *Deleted

## 2022-12-07 ENCOUNTER — Other Ambulatory Visit: Payer: Self-pay

## 2022-12-07 DIAGNOSIS — F17210 Nicotine dependence, cigarettes, uncomplicated: Secondary | ICD-10-CM | POA: Diagnosis present

## 2022-12-07 DIAGNOSIS — J441 Chronic obstructive pulmonary disease with (acute) exacerbation: Principal | ICD-10-CM | POA: Diagnosis present

## 2022-12-07 DIAGNOSIS — I10 Essential (primary) hypertension: Secondary | ICD-10-CM | POA: Diagnosis present

## 2022-12-07 DIAGNOSIS — R739 Hyperglycemia, unspecified: Secondary | ICD-10-CM | POA: Diagnosis present

## 2022-12-07 DIAGNOSIS — E162 Hypoglycemia, unspecified: Secondary | ICD-10-CM | POA: Diagnosis present

## 2022-12-07 DIAGNOSIS — Z825 Family history of asthma and other chronic lower respiratory diseases: Secondary | ICD-10-CM

## 2022-12-07 DIAGNOSIS — Z597 Insufficient social insurance and welfare support: Secondary | ICD-10-CM | POA: Diagnosis not present

## 2022-12-07 DIAGNOSIS — R9431 Abnormal electrocardiogram [ECG] [EKG]: Secondary | ICD-10-CM | POA: Diagnosis present

## 2022-12-07 DIAGNOSIS — J45901 Unspecified asthma with (acute) exacerbation: Principal | ICD-10-CM | POA: Diagnosis present

## 2022-12-07 DIAGNOSIS — Z8249 Family history of ischemic heart disease and other diseases of the circulatory system: Secondary | ICD-10-CM | POA: Diagnosis not present

## 2022-12-07 DIAGNOSIS — Z1152 Encounter for screening for COVID-19: Secondary | ICD-10-CM

## 2022-12-07 DIAGNOSIS — Z753 Unavailability and inaccessibility of health-care facilities: Secondary | ICD-10-CM

## 2022-12-07 DIAGNOSIS — Z79899 Other long term (current) drug therapy: Secondary | ICD-10-CM | POA: Diagnosis not present

## 2022-12-07 DIAGNOSIS — Z7951 Long term (current) use of inhaled steroids: Secondary | ICD-10-CM | POA: Diagnosis not present

## 2022-12-07 DIAGNOSIS — K219 Gastro-esophageal reflux disease without esophagitis: Secondary | ICD-10-CM | POA: Diagnosis present

## 2022-12-07 DIAGNOSIS — J9601 Acute respiratory failure with hypoxia: Secondary | ICD-10-CM | POA: Diagnosis present

## 2022-12-07 DIAGNOSIS — R718 Other abnormality of red blood cells: Secondary | ICD-10-CM | POA: Diagnosis present

## 2022-12-07 DIAGNOSIS — F17200 Nicotine dependence, unspecified, uncomplicated: Secondary | ICD-10-CM | POA: Diagnosis present

## 2022-12-07 DIAGNOSIS — T380X5A Adverse effect of glucocorticoids and synthetic analogues, initial encounter: Secondary | ICD-10-CM | POA: Diagnosis present

## 2022-12-07 LAB — PROCALCITONIN: Procalcitonin: <0.1 ng/mL

## 2022-12-07 LAB — CREATININE, SERUM
Creatinine, Ser: 0.9 mg/dL (ref 0.61–1.24)
GFR, Estimated: >60 mL/min (ref >60)

## 2022-12-07 LAB — BASIC METABOLIC PANEL
Anion gap: 10 (ref 5–15)
BUN: 12 mg/dL (ref 8–23)
CO2: 29 mmol/L (ref 22–32)
Calcium: 9.2 mg/dL (ref 8.9–10.3)
Chloride: 99 mmol/L (ref 98–111)
Creatinine, Ser: 1.03 mg/dL (ref 0.61–1.24)
GFR, Estimated: >60 mL/min (ref >60)
Glucose, Bld: 128 mg/dL — ABNORMAL HIGH (ref 70–99)
Potassium: 4.2 mmol/L (ref 3.5–5.1)
Sodium: 138 mmol/L (ref 135–145)

## 2022-12-07 LAB — RETICULOCYTES
Immature Retic Fract: 9.3 % (ref 2.3–15.9)
RBC.: 6.34 MIL/uL — ABNORMAL HIGH (ref 4.22–5.81)
Retic Count, Absolute: 48.8 10*3/uL (ref 19.0–186.0)
Retic Ct Pct: 0.8 % (ref 0.4–3.1)

## 2022-12-07 LAB — CBC
HCT: 45.3 % (ref 39.0–52.0)
HCT: 43.5 % (ref 39.0–52.0)
Hemoglobin: 14.2 g/dL (ref 13.0–17.0)
Hemoglobin: 13.7 g/dL (ref 13.0–17.0)
MCH: 21.6 pg — ABNORMAL LOW (ref 26.0–34.0)
MCH: 21.6 pg — ABNORMAL LOW (ref 26.0–34.0)
MCHC: 31.3 g/dL (ref 30.0–36.0)
MCHC: 31.5 g/dL (ref 30.0–36.0)
MCV: 68.9 fL — ABNORMAL LOW (ref 80.0–100.0)
MCV: 68.7 fL — ABNORMAL LOW (ref 80.0–100.0)
Platelets: 328 10*3/uL (ref 150–400)
Platelets: 300 10*3/uL (ref 150–400)
RBC: 6.57 MIL/uL — ABNORMAL HIGH (ref 4.22–5.81)
RBC: 6.33 MIL/uL — ABNORMAL HIGH (ref 4.22–5.81)
RDW: 19.1 % — ABNORMAL HIGH (ref 11.5–15.5)
RDW: 18.8 % — ABNORMAL HIGH (ref 11.5–15.5)
WBC: 8.2 10*3/uL (ref 4.0–10.5)
WBC: 5 10*3/uL (ref 4.0–10.5)
nRBC: 0 % (ref 0.0–0.2)
nRBC: 0 % (ref 0.0–0.2)

## 2022-12-07 LAB — RESP PANEL BY RT-PCR (RSV, FLU A&B, COVID)  RVPGX2
Influenza A by PCR: NEGATIVE
Influenza B by PCR: NEGATIVE
Resp Syncytial Virus by PCR: NEGATIVE
SARS Coronavirus 2 by RT PCR: NEGATIVE

## 2022-12-07 LAB — RAPID URINE DRUG SCREEN, HOSP PERFORMED
Amphetamines: NOT DETECTED
Barbiturates: NOT DETECTED
Benzodiazepines: NOT DETECTED
Cocaine: POSITIVE — AB
Opiates: NOT DETECTED
Tetrahydrocannabinol: NOT DETECTED

## 2022-12-07 LAB — IRON AND TIBC
Iron: 52 ug/dL (ref 45–182)
Saturation Ratios: 10 % — ABNORMAL LOW (ref 17.9–39.5)
TIBC: 518 ug/dL — ABNORMAL HIGH (ref 250–450)
UIBC: 466 ug/dL

## 2022-12-07 LAB — ECHOCARDIOGRAM COMPLETE
AR max vel: 3.97 cm2
AV Area VTI: 4.05 cm2
AV Area mean vel: 3.95 cm2
AV Mean grad: 3 mmHg
AV Peak grad: 5.3 mmHg
Ao pk vel: 1.15 m/s
Area-P 1/2: 3.46 cm2
Height: 72 in
S' Lateral: 2.6 cm
Weight: 2208 oz

## 2022-12-07 LAB — ETHANOL: Alcohol, Ethyl (B): <10 mg/dL (ref <10)

## 2022-12-07 LAB — FOLATE: Folate: 12.4 ng/mL (ref >5.9)

## 2022-12-07 LAB — LACTIC ACID, PLASMA
Lactic Acid, Venous: 2.3 mmol/L (ref 0.5–1.9)
Lactic Acid, Venous: 2.1 mmol/L (ref 0.5–1.9)

## 2022-12-07 LAB — VITAMIN B12: Vitamin B-12: 428 pg/mL (ref 180–914)

## 2022-12-07 LAB — FERRITIN: Ferritin: 13 ng/mL — ABNORMAL LOW (ref 24–336)

## 2022-12-07 LAB — TSH: TSH: 0.277 u[IU]/mL — ABNORMAL LOW (ref 0.350–4.500)

## 2022-12-07 LAB — HEMOGLOBIN A1C
Hgb A1c MFr Bld: 6.2 % — ABNORMAL HIGH (ref 4.8–5.6)
Mean Plasma Glucose: 131 mg/dL

## 2022-12-07 MED ORDER — ACETAMINOPHEN 650 MG RE SUPP: 650.0000 mg | Freq: Four times a day (QID) | RECTAL | Status: DC | PRN

## 2022-12-07 MED ORDER — IPRATROPIUM-ALBUTEROL 0.5-2.5 (3) MG/3ML IN SOLN
3.0000 mL | Freq: Four times a day (QID) | RESPIRATORY_TRACT | Status: DC
  Administered 2022-12-07 – 2022-12-08 (×5): 3 mL via RESPIRATORY_TRACT
  Filled 2022-12-07 (×6): qty 3

## 2022-12-07 MED ORDER — ONDANSETRON HCL 4 MG PO TABS: 4.0000 mg | ORAL_TABLET | Freq: Four times a day (QID) | ORAL | Status: DC | PRN

## 2022-12-07 MED ORDER — METHYLPREDNISOLONE SODIUM SUCC 125 MG IJ SOLR: 60.0000 mg | Freq: Two times a day (BID) | INTRAMUSCULAR | Status: DC

## 2022-12-07 MED ORDER — METHYLPREDNISOLONE SODIUM SUCC 125 MG IJ SOLR
120.0000 mg | Freq: Every day | INTRAMUSCULAR | Status: DC
  Administered 2022-12-07: 120 mg via INTRAVENOUS
  Filled 2022-12-07: qty 2

## 2022-12-07 MED ORDER — ACETAMINOPHEN 325 MG PO TABS: 650.0000 mg | ORAL_TABLET | Freq: Four times a day (QID) | ORAL | Status: DC | PRN

## 2022-12-07 MED ORDER — IPRATROPIUM-ALBUTEROL 0.5-2.5 (3) MG/3ML IN SOLN
3.0000 mL | Freq: Once | RESPIRATORY_TRACT | Status: AC
  Administered 2022-12-07: 3 mL via RESPIRATORY_TRACT
  Filled 2022-12-07: qty 3

## 2022-12-07 MED ORDER — ALUM & MAG HYDROXIDE-SIMETH 200-200-20 MG/5ML PO SUSP: 30.0000 mL | Freq: Four times a day (QID) | ORAL | Status: DC | PRN

## 2022-12-07 MED ORDER — BUDESONIDE 0.5 MG/2ML IN SUSP
0.5000 mg | Freq: Two times a day (BID) | RESPIRATORY_TRACT | Status: DC
  Administered 2022-12-07 – 2022-12-08 (×3): 0.5 mg via RESPIRATORY_TRACT
  Filled 2022-12-07 (×3): qty 2

## 2022-12-07 MED ORDER — AMLODIPINE BESYLATE 2.5 MG PO TABS
2.5000 mg | ORAL_TABLET | Freq: Every day | ORAL | Status: DC
  Administered 2022-12-07 – 2022-12-08 (×2): 2.5 mg via ORAL
  Filled 2022-12-07 (×2): qty 1

## 2022-12-07 MED ORDER — ENOXAPARIN SODIUM 40 MG/0.4ML IJ SOSY
40.0000 mg | PREFILLED_SYRINGE | Freq: Every day | INTRAMUSCULAR | Status: DC
  Administered 2022-12-07: 40 mg via SUBCUTANEOUS
  Filled 2022-12-07 (×2): qty 0.4

## 2022-12-07 MED ORDER — FAMOTIDINE 20 MG PO TABS
20.0000 mg | ORAL_TABLET | Freq: Every day | ORAL | Status: DC
  Administered 2022-12-07 – 2022-12-08 (×2): 20 mg via ORAL
  Filled 2022-12-07 (×2): qty 1

## 2022-12-07 MED ORDER — ONDANSETRON HCL 4 MG/2ML IJ SOLN: 4.0000 mg | Freq: Four times a day (QID) | INTRAMUSCULAR | Status: DC | PRN

## 2022-12-07 MED ORDER — ALBUTEROL SULFATE (2.5 MG/3ML) 0.083% IN NEBU: 2.5000 mg | INHALATION_SOLUTION | RESPIRATORY_TRACT | Status: DC | PRN

## 2022-12-07 NOTE — H&P (Signed)
History and Physical    Patient: Colton Ford [id number] DOB: [birth year] DOA: 12/07/2022 DOS: the patient was seen and examined on 12/07/2022 PCP: Tanda Rockers, MD  Patient coming from: Home  Chief Complaint:  Chief Complaint  Patient presents with   Shortness of Breath        HPI: Colton Ford is a 62 y.o. male with medical history significant of tobacco abuse, COPD/asthma on preventative medications and hypertension although the patient states he does not have a history of this and does not take medications.  Patient reports that he has done well since he was discharged on 2/2 for exacerbation of asthma with bronchitis.  Several days ago he developed shortness of breath with dry cough.  A few days after that he ran out of all his asthma medications and his symptoms worsened to the point he could not walk without severe dyspnea on exertion.  He has not had any fevers, chills or myalgias.  Patient presented to the ED via EMS in significant respiratory extremis.  He required BiPAP for short.  Time but responded to multiple breathing treatments and high-dose IV Solu-Medrol.  At time of evaluation patient was on 4 L O2 per minute.  He is reporting a headache.  PCR for flu, COVID and RSV negative.  Chest x-ray completed in the ED did not reveal any evidence of pneumonia.  EDP has requested that we evaluate the patient for admission.   Review of Systems: As mentioned in the history of present illness. All other systems reviewed and are negative. Past Medical History:  Diagnosis Date   Agitation    Asthma    Mild anemia    History reviewed. No pertinent surgical history. Social History:  reports that he has been smoking cigarettes. He has a 6.00 pack-year smoking history. He has never used smokeless tobacco. He reports that he does not drink alcohol and does not use drugs.  No Known Allergies  Family History  Problem Relation Age of Onset   Asthma Sister    Heart disease Sister     Heart disease Brother     Prior to Admission medications   Medication Sig Start Date End Date Taking? Authorizing Provider  albuterol (PROVENTIL) (2.5 MG/3ML) 0.083% nebulizer solution Use 1 vial via nebulizer every 6 (six) hours as needed for wheezing or shortness of breath. 08/08/22 11/04/23 Yes Elodia Florence., MD  albuterol (VENTOLIN HFA) 108 (90 Base) MCG/ACT inhaler Inhale 2 puffs into the lungs every 6 (six) hours as needed for wheezing or shortness of breath. 08/08/22  Yes Elodia Florence., MD  fluticasone-salmeterol (ADVAIR HFA) [phone] MCG/ACT inhaler Inhale 2 puffs into the lungs 2 (two) times daily. 11/03/22  Yes Thurnell Lose, MD  Multiple Vitamin (MULTIVITAMIN) tablet Take 1 tablet by mouth daily.   Yes [provider]  raNITIdine HCl (ACID REDUCER PO) Take 2 tablets by mouth daily as needed (acid reflux).   Yes [provider]  nicotine (NICODERM CQ - DOSED IN MG/24 HOURS) 14 mg/24hr patch Place 1 patch (14 mg total) onto the skin daily as needed (smoking cessation). Patient not taking: Reported on 12/07/2022 11/03/22   Thurnell Lose, MD  tiotropium (SPIRIVA HANDIHALER) 18 MCG inhalation capsule Place 1 capsule (18 mcg total) into inhaler and inhale daily. Patient not taking: Reported on 12/07/2022 11/03/22 12/03/22  Thurnell Lose, MD    Physical Exam: Vitals:   12/07/22 0930 12/07/22 0945 12/07/22 1000 12/07/22 1048  BP: 129/80 132/73 132/75   Pulse: 93 95 94   Resp: '18 20 16   '$ Temp:    98.6 F (37 C)  TempSrc:    Oral  SpO2: 99% 96% 97%   Weight:      Height:       Constitutional: NAD, calm, comfortable but is reporting a headache Respiratory: Diffuse inspiratory crackles on posterior exam.  Normal respiratory effort. No accessory muscle use.  4 L/min Cardiovascular: Regular rate and rhythm, no murmurs / rubs / gallops. No extremity edema. 2+ pedal pulses.  Abdomen: no tenderness, no masses palpated. No hepatosplenomegaly. Bowel sounds  positive.  Musculoskeletal: no clubbing / cyanosis. No joint deformity upper and lower extremities. Good ROM, no contractures. Normal muscle tone.  Skin: no rashes, lesions, ulcers. No induration Neurologic: CN 2-12 grossly intact. Sensation intact, Strength 5/5 x all 4 extremities.  Psychiatric: Normal judgment and insight. Alert and oriented x 3. Normal mood.   Data Reviewed:  WBC 5000, hemoglobin 13.7, MCV 68.7, blood cultures have been obtained and are pending.  Initial lactic acid was 2.3, alcohol level was less than 10  Assessment and Plan: Acute asthma/COPD exacerbation Likely viral in etiology and further worsened by patient's lack of access to usual medications for asthma and COPD Patient states he has lack of funding, but does not have insurance and does not have a current PCP for refills For now we will continue IV Solu-Medrol, DuoNebs and budesonide nebs Hopeful can transition to prednisone and discharge in a.m. No pneumonia on chest x-ray and procalcitonin less than 0.1 making it less likely that this is a pneumonia process-no indication for antibiotics TOC consulted to assist with medication and location of available PCP Wean O2 as tolerated, flutter valve and or incentive spirometry, early mobilization As a precaution we will check urinary strep and urinary Legionella.  Blood cultures are pending  Hypertension Patient denies utilization of medications 4 days prior to admission but blood pressure has remained consistently over 140/90 We will start Norvasc 2.5 daily Review of EKG reveals positive LVH criteria, will check echocardiogram to rule out HFpEF Patient has had poor access to healthcare and has had some mild hyperglycemia so we will check a hemoglobin A1c.  Hypoglycemia likely related to acute stressors and recent administration of IV steroids  Microcytosis with normal hemoglobin Iron is normal at 52 with an elevated TIBC of 518 and a low sat rate of 10 and a low  ferritin of 13 TSH is low at 0.277 but this could be an acute phase reactant Continue to follow in the outpatient setting  Ongoing tobacco abuse Cessation counseling given   Advance Care Planning:   Code Status: Full Code   DVT prophylaxis: Lovenox  Consults: None  Family Communication: Patient only  Severity of Illness: The appropriate patient status for this patient is OBSERVATION. Observation status is judged to be reasonable and necessary in order to provide the required intensity of service to ensure the patient's safety. The patient's presenting symptoms, physical exam findings, and initial radiographic and laboratory data in the context of their medical condition is felt to place them at decreased risk for further clinical deterioration. Furthermore, it is anticipated that the patient will be medically stable for discharge from the hospital within 2 midnights of admission.   Author: Erin Hearing, NP 12/07/2022 10:54 AM  For on call review [email or web address].

## 2022-12-07 NOTE — Progress Notes (Signed)
Patient weaned off BiPAP to [place] tolerating well.

## 2022-12-07 NOTE — TOC Initial Note (Signed)
Transition of Care (TOC) - Initial/Assessment Note   Spoke to patient at bedside. Confirmed face sheet information . Patient from home with wife .   Patient retired early and does not have insurance. Consented for referral to financial counselor. Same sent. Pharmacy changed to [facility] . At discharge T [facility] pharmacy will call with cost. If patient cannot afford will see if he is eligible for MATCH ( can only use once a year).   Patient has a NEB machine at home . Patient does not have home oxygen     Patient does not have a PCP . Patient agreeable to a [facility] . Appointment scheduled and information placed on AVS.  Patient Details  Name: Colton Ford MRN: [id number] Date of Birth: [birth year]  Transition of Care [facility]) CM/SW Contact:    Marilu Favre, RN Phone Number: 12/07/2022, 4:28 PM  Clinical Narrative:                   Expected Discharge Plan: Home/Self Care Barriers to Discharge: Continued Medical Work up   Patient Goals and CMS Choice Patient states their goals for this hospitalization and ongoing recovery are:: to return to home          Expected Discharge Plan and Services In-house Referral: Financial Counselor Discharge Planning Services: CM Consult   Living arrangements for the past 2 months: Single Family Home                   DME Agency: NA       HH Arranged: NA          Prior Living Arrangements/Services Living arrangements for the past 2 months: Single Family Home Lives with:: Spouse Patient language and need for interpreter reviewed:: Yes Do you feel safe going back to the place where you live?: Yes      Need for Family Participation in Patient Care: Yes (Comment) Care giver support system in place?: Yes (comment) Current home services: DME Criminal Activity/Legal Involvement Pertinent to Current Situation/Hospitalization: No - Comment as needed  Activities of Daily Living      Permission Sought/Granted   Permission granted  to share information with : No              Emotional Assessment Appearance:: Appears stated age Attitude/Demeanor/Rapport: Engaged Affect (typically observed): Accepting Orientation: : Oriented to Self, Oriented to Place, Oriented to  Time, Oriented to Situation Alcohol / Substance Use: Not Applicable Psych Involvement: No (comment)  Admission diagnosis:  Asthma exacerbation [J45.901] Acute asthma exacerbation [J45.901] Severe asthma with exacerbation, unspecified whether persistent [J45.901] COPD exacerbation ([place]) [J44.1] Patient Active Problem List   Diagnosis Date Noted   Asthma exacerbation 12/07/2022   Abnormal finding on EKG/positive LVH criteria 12/07/2022   Microcytosis 12/07/2022   COPD with acute exacerbation ([place]) 11/03/2022   Tobacco abuse 11/03/2022   GERD without esophagitis 11/03/2022   Uncontrolled hypertension 11/03/2022   Hypoxia 11/03/2022   Community acquired pneumonia of left lung 11/03/2022   Sepsis due to pneumonia ([place]) 11/02/2022   Acute asthma exacerbation 08/08/2022   Poorly controlled severe persistent asthma without complication [id number]   Essential hypertension 04/12/2017   Asthma 01/15/2014   Tobacco use disorder 01/15/2014   COPD exacerbation ([place]) 04/10/2011   Inguinal hernia 03/20/2011   PCP:  Tanda Rockers, MD Pharmacy:   [id number] [address] [place] [address]Phone: [phone]: [phone] [facility] [address] [place] [address] Phone: [phone] Fax: [place] [id number] [place], [place] - [address] [place] [address] Phone: [phone] Fax: [phone]  [facility] [place] [place] [address] Phone: [phone] Fax: [phone]     Social  Determinants of Health (SDOH) Social History: SDOH Screenings   Tobacco Use: High Risk (12/07/2022)   SDOH Interventions:     Readmission Risk Interventions     No data to display

## 2022-12-07 NOTE — ED Triage Notes (Signed)
Patient with reported onset of shortness of breath for the past 2-3 days.  Patient has been using his albuterol inhaler x 7 today with only 1 hour of relief.  Patient called ems to home, reported to have initial sat of 77% on room air.  He has wheezing insp and exp grunting noted.  Patient given albuterol '5mg'$  with fire, additional '5mg'$  with EMS.  Patient was placed on cpap and he felt better.  Patient also received '125mg'$  solumedrol and 2gram magnesium with EMS.  Patient removed from cpap at 235 to see if he could tolerate.  Patient arrives with obvious work of breathing, exp grunting continues.  He denies any chest pain.

## 2022-12-07 NOTE — ED Notes (Signed)
ED TO INPATIENT HANDOFF REPORT  ED Nurse Name and Phone #: Marye Round Name/Age/Gender Colton Ford 62 y.o. male Room/Bed: 009C/009C  Code Status   Code Status: Full Code  Home/SNF/Other Home Patient oriented to: self, place, time, and situation Is this baseline? Yes   Triage Complete: Triage complete  Chief Complaint Asthma exacerbation [J45.901] Acute asthma exacerbation [J45.901]  Triage Note Patient with reported onset of shortness of breath for the past 2-3 days.  Patient has been using his albuterol inhaler x 7 today with only 1 hour of relief.  Patient called ems to home, reported to have initial sat of 77% on room air.  He has wheezing insp and exp grunting noted.  Patient given albuterol '5mg'$  with fire, additional '5mg'$  with EMS.  Patient was placed on cpap and he felt better.  Patient also received '125mg'$  solumedrol and 2gram magnesium with EMS.  Patient removed from cpap at 235 to see if he could tolerate.  Patient arrives with obvious work of breathing, exp grunting continues.  He denies any chest pain.     Allergies No Known Allergies  Level of Care/Admitting Diagnosis ED Disposition     ED Disposition  Admit   Condition  --   Comment  Hospital Area: [facility] [[id number]]  Level of Care: Telemetry Medical [104]  May place patient in observation at [facility] or [place] if equivalent level of care is available:: Yes  Covid Evaluation: Confirmed COVID Negative  Diagnosis: Acute asthma exacerbation [id number]  Admitting Physician: Jonetta Osgood [id number]  Attending Physician: Jonetta Osgood [[id number]]          B Medical/Surgery History Past Medical History:  Diagnosis Date   Agitation    Asthma    Mild anemia    History reviewed. No pertinent surgical history.   A IV Location/Drains/Wounds Patient Lines/Drains/Airways Status     Active Line/Drains/Airways     Name Placement date Placement time Site Days   Peripheral IV  12/07/22 18 G Right Antecubital 12/07/22  0252  Antecubital  less than 1   Peripheral IV 12/07/22 18 G Left Antecubital 12/07/22  --  Antecubital  less than 1            Intake/Output Last 24 hours No intake or output data in the 24 hours ending 12/07/22 1154  Labs/Imaging Results for orders placed or performed during the hospital encounter of 12/07/22 (from the past 48 hour(s))  CBC     Status: Abnormal   Collection Time: 12/07/22  2:45 AM  Result Value Ref Range   WBC 8.2 4.0 - 10.5 K/uL   RBC 6.57 (H) 4.22 - 5.81 MIL/uL   Hemoglobin 14.2 13.0 - 17.0 g/dL   HCT 45.3 39.0 - 52.0 %   MCV 68.9 (L) 80.0 - 100.0 fL   MCH 21.6 (L) 26.0 - 34.0 pg   MCHC 31.3 30.0 - 36.0 g/dL   RDW 19.1 (H) 11.5 - 15.5 %   Platelets 328 150 - 400 K/uL    Comment: REPEATED TO VERIFY   nRBC 0.0 0.0 - 0.2 %    Comment: Performed at [facility], [place] [address], [place], [place] [id number]  Basic metabolic panel     Status: Abnormal   Collection Time: 12/07/22  2:45 AM  Result Value Ref Range   Sodium 138 135 - 145 mmol/L   Potassium 4.2 3.5 - 5.1 mmol/L   Chloride 99 98 - 111 mmol/L  CO2 29 22 - 32 mmol/L   Glucose, Bld 128 (H) 70 - 99 mg/dL    Comment: Glucose reference range applies only to samples taken after fasting for at least 8 hours.   BUN 12 8 - 23 mg/dL   Creatinine, Ser 1.03 0.61 - 1.24 mg/dL   Calcium 9.2 8.9 - 10.3 mg/dL   GFR, Estimated >60 >60 mL/min    Comment: (NOTE) Calculated using the CKD-EPI Creatinine Equation (2021)    Anion gap 10 5 - 15    Comment: Performed at [facility] [address] [place], [place] [address]  Lactic acid, plasma     Status: Abnormal   Collection Time: 12/07/22  6:17 AM  Result Value Ref Range   Lactic Acid, Venous 2.3 (HH) 0.5 - 1.9 mmol/L    Comment: CRITICAL RESULT CALLED TO, READ BACK BY AND VERIFIED WITH Samantha Crimes RN '@[id number]'$  03.07.2024 E.AHMED Performed at [facility], [place] [address], [place], [place] [address]    Resp panel by RT-PCR (RSV, Flu A&B, Covid) Anterior Nasal Swab     Status: None   Collection Time: 12/07/22  6:24 AM   Specimen: Anterior Nasal Swab  Result Value Ref Range   SARS Coronavirus 2 by RT PCR NEGATIVE NEGATIVE   Influenza A by PCR NEGATIVE NEGATIVE   Influenza B by PCR NEGATIVE NEGATIVE    Comment: (NOTE) The Xpert Xpress SARS-CoV-2/FLU/RSV plus assay is intended as an aid in the diagnosis of influenza from Nasopharyngeal swab specimens and should not be used as a sole basis for treatment. Nasal washings and aspirates are unacceptable for Xpert Xpress SARS-CoV-2/FLU/RSV testing.  Fact Sheet for Patients: EntrepreneurPulse.com.au  Fact Sheet for Healthcare Providers: IncredibleEmployment.be  This test is not yet approved or cleared by the [place] FDA and has been authorized for detection and/or diagnosis of SARS-CoV-2 by FDA under an Emergency Use Authorization (EUA). This EUA will remain in effect (meaning this test can be used) for the duration of the COVID-19 declaration under Section 564(b)(1) of the Act, 21 U.S.C. section 360bbb-3(b)(1), unless the authorization is terminated or revoked.     Resp Syncytial Virus by PCR NEGATIVE NEGATIVE    Comment: (NOTE) Fact Sheet for Patients: EntrepreneurPulse.com.au  Fact Sheet for Healthcare Providers: IncredibleEmployment.be  This test is not yet approved or cleared by the [place] FDA and has been authorized for detection and/or diagnosis of SARS-CoV-2 by FDA under an Emergency Use Authorization (EUA). This EUA will remain in effect (meaning this test can be used) for the duration of the COVID-19 declaration under Section 564(b)(1) of the Act, 21 U.S.C. section 360bbb-3(b)(1), unless the authorization is terminated or revoked.  Performed at [facility], [place] [address], [place], [place] [address]   Procalcitonin     Status:  None   Collection Time: 12/07/22  7:40 AM  Result Value Ref Range   Procalcitonin <0.10 ng/mL    Comment:        Interpretation: PCT (Procalcitonin) <= 0.5 ng/mL: Systemic infection (sepsis) is not likely. Local bacterial infection is possible. (NOTE)       Sepsis PCT Algorithm           Lower Respiratory Tract                                      Infection PCT Algorithm    ----------------------------     ----------------------------  PCT < 0.25 ng/mL                PCT < 0.10 ng/mL          Strongly encourage             Strongly discourage   discontinuation of antibiotics    initiation of antibiotics    ----------------------------     -----------------------------       PCT 0.25 - 0.50 ng/mL            PCT 0.10 - 0.25 ng/mL               OR       >80% decrease in PCT            Discourage initiation of                                            antibiotics      Encourage discontinuation           of antibiotics    ----------------------------     -----------------------------         PCT >= 0.50 ng/mL              PCT 0.26 - 0.50 ng/mL               AND        <80% decrease in PCT             Encourage initiation of                                             antibiotics       Encourage continuation           of antibiotics    ----------------------------     -----------------------------        PCT >= 0.50 ng/mL                  PCT > 0.50 ng/mL               AND         increase in PCT                  Strongly encourage                                      initiation of antibiotics    Strongly encourage escalation           of antibiotics                                     -----------------------------                                           PCT <= 0.25 ng/mL  OR                                        > 80% decrease in PCT                                      Discontinue / Do not initiate                                              antibiotics  Performed at [facility], [place] [address], [place], [place] [address]   Ethanol     Status: None   Collection Time: 12/07/22  7:40 AM  Result Value Ref Range   Alcohol, Ethyl (B) <10 <10 mg/dL    Comment: (NOTE) Lowest detectable limit for serum alcohol is 10 mg/dL.  For medical purposes only. Performed at [facility], [place] [address], [place], [place] [address]   Vitamin B12     Status: None   Collection Time: 12/07/22  7:40 AM  Result Value Ref Range   Vitamin B-12 428 180 - 914 pg/mL    Comment: (NOTE) This assay is not validated for testing neonatal or myeloproliferative syndrome specimens for Vitamin B12 levels. Performed at [facility], [place] [address], [place], [place] [address]   Folate     Status: None   Collection Time: 12/07/22  7:40 AM  Result Value Ref Range   Folate 12.4 >5.9 ng/mL    Comment: Performed at [facility] [address] [place], [place] [address]  Iron and TIBC     Status: Abnormal   Collection Time: 12/07/22  7:40 AM  Result Value Ref Range   Iron 52 45 - 182 ug/dL   TIBC 518 (H) 250 - 450 ug/dL   Saturation Ratios 10 (L) 17.9 - 39.5 %   UIBC 466 ug/dL    Comment: Performed at [facility], [place] [address], [place], [place] [address]  Ferritin     Status: Abnormal   Collection Time: 12/07/22  7:40 AM  Result Value Ref Range   Ferritin 13 (L) 24 - 336 ng/mL    Comment: Performed at [facility], [place] [address] [place], [place] [address]  Reticulocytes     Status: Abnormal   Collection Time: 12/07/22  7:40 AM  Result Value Ref Range   Retic Ct Pct 0.8 0.4 - 3.1 %   RBC. 6.34 (H) 4.22 - 5.81 MIL/uL   Retic Count, Absolute 48.8 19.0 - 186.0 K/uL   Immature Retic Fract 9.3 2.3 - 15.9 %    Comment: Performed at [facility] [address] [place], [place] [address]  TSH     Status: Abnormal   Collection Time: 12/07/22  7:40 AM  Result Value Ref Range   TSH  0.277 (L) 0.350 - 4.500 uIU/mL    Comment: Performed by a 3rd Generation assay with a functional sensitivity of <=0.01 uIU/mL. Performed at [facility], [place] [address] [place],  [address]   CBC     Status: Abnormal   Collection Time: 12/07/22  7:40 AM  Result Value Ref Range  WBC 5.0 4.0 - 10.5 K/uL   RBC 6.33 (H) 4.22 - 5.81 MIL/uL   Hemoglobin 13.7 13.0 - 17.0 g/dL   HCT 43.5 39.0 - 52.0 %   MCV 68.7 (L) 80.0 - 100.0 fL   MCH 21.6 (L) 26.0 - 34.0 pg   MCHC 31.5 30.0 - 36.0 g/dL   RDW 18.8 (H) 11.5 - 15.5 %   Platelets 300 150 - 400 K/uL    Comment: REPEATED TO VERIFY   nRBC 0.0 0.0 - 0.2 %    Comment: Performed at [facility], [place] [address], [place], [place] [address]  Creatinine, serum     Status: None   Collection Time: 12/07/22  7:40 AM  Result Value Ref Range   Creatinine, Ser 0.90 0.61 - 1.24 mg/dL   GFR, Estimated >60 >60 mL/min    Comment: (NOTE) Calculated using the CKD-EPI Creatinine Equation (2021) Performed at [facility] [address], [place], [place] [address]    DG Chest Portable 1 View  Result Date: 12/07/2022 CLINICAL DATA:  Shortness of breath EXAM: PORTABLE CHEST 1 VIEW COMPARISON:  11/02/2022 FINDINGS: Hyperinflation. Heart and mediastinal contours are within normal limits. No focal opacities or effusions. No acute bony abnormality. Aortic atherosclerosis. IMPRESSION: Hyperinflation.  No active cardiopulmonary disease. Electronically Signed   By: Rolm Baptise M.D.   On: 12/07/2022 02:52    Pending Labs Unresulted Labs (From admission, onward)     Start     Ordered   12/14/22 0500  Creatinine, serum  (enoxaparin (LOVENOX)    CrCl >/= 30 ml/min)  Weekly,   R     Comments: while on enoxaparin therapy    12/07/22 0638   12/08/22 0500  Comprehensive metabolic panel  Tomorrow morning,   R        12/07/22 0638   12/08/22 0500  CBC  Tomorrow morning,   R        12/07/22 0638   12/07/22 0625  Hemoglobin A1c  Once,   R         12/07/22 0624   12/07/22 0618  Rapid urine drug screen (hospital performed)  ONCE - STAT,   STAT        12/07/22 0617   12/07/22 0617  Culture, blood (Routine X 2) w Reflex to ID Panel  BLOOD CULTURE X 2,   R (with TIMED occurrences)      12/07/22 0616   12/07/22 0617  Lactic acid, plasma  Now then every 2 hours,   R (with TIMED occurrences)      12/07/22 0616   12/07/22 0616  Strep pneumoniae urinary antigen  Once,   R        12/07/22 0615   12/07/22 0616  Legionella Pneumophila Serogp 1 Ur Ag  Once,   R        12/07/22 0615            Vitals/Pain Today's Vitals   12/07/22 1000 12/07/22 1048 12/07/22 1100 12/07/22 1115  BP: 132/75  (!) 150/86 (!) 152/80  Pulse: 94  93 92  Resp: 16  (!) 22 19  Temp:  98.6 F (37 C)    TempSrc:  Oral    SpO2: 97%  93% 93%  Weight:      Height:      PainSc:        Isolation Precautions Airborne and Contact precautions  Medications Medications  budesonide (PULMICORT) nebulizer solution 0.5 mg (0.5 mg Nebulization  Given 12/07/22 0854)  amLODipine (NORVASC) tablet 2.5 mg (2.5 mg Oral Given 12/07/22 0819)  methylPREDNISolone sodium succinate (SOLU-MEDROL) 125 mg/2 mL injection 120 mg (has no administration in time range)  enoxaparin (LOVENOX) injection 40 mg (40 mg Subcutaneous Given 12/07/22 0819)  acetaminophen (TYLENOL) tablet 650 mg (has no administration in time range)    Or  acetaminophen (TYLENOL) suppository 650 mg (has no administration in time range)  ondansetron (ZOFRAN) tablet 4 mg (has no administration in time range)    Or  ondansetron (ZOFRAN) injection 4 mg (has no administration in time range)  ipratropium-albuterol (DUONEB) 0.5-2.5 (3) MG/3ML nebulizer solution 3 mL (3 mLs Nebulization Given 12/07/22 0819)  albuterol (PROVENTIL) (2.5 MG/3ML) 0.083% nebulizer solution 2.5 mg (has no administration in time range)  ipratropium-albuterol (DUONEB) 0.5-2.5 (3) MG/3ML nebulizer solution 3 mL (3 mLs Nebulization Given 12/07/22 0405)     Mobility walks     Focused Assessments Pulmonary Assessment Handoff:  Lung sounds: Bilateral Breath Sounds: Diminished L Breath Sounds: Diminished, Expiratory wheezes, Inspiratory wheezes R Breath Sounds: Diminished, Expiratory wheezes, Inspiratory wheezes O2 Device: Nasal Cannula O2 Flow Rate (L/min): 5 L/min    R Recommendations: See Admitting Provider Note  Report given to:   Additional Notes:

## 2022-12-07 NOTE — ED Provider Notes (Signed)
[facility] Emergency Department Provider Note MRN:  [id number]  Arrival date & time: 12/07/22     Chief Complaint   Shortness of Breath (/)   History of Present Illness   Colton Ford is a 62 y.o. year-old male with a history of asthma presenting to the ED with chief complaint of shortness of breath.  Shortness of breath worsening over the past few days.  Had to take his albuterol inhaler several times today and would get only brief relief.  Called EMS.  Denies chest pain.  Review of Systems  A thorough review of systems was obtained and all systems are negative except as noted in the HPI and PMH.   Patient's Health History    Past Medical History:  Diagnosis Date   Agitation    Asthma    Mild anemia     History reviewed. No pertinent surgical history.  Family History  Problem Relation Age of Onset   Asthma Sister    Heart disease Sister    Heart disease Brother     Social History   Socioeconomic History   Marital status: Single    Spouse name: Not on file   Number of children: 2   Years of education: Not on file   Highest education level: Not on file  Occupational History   Occupation: Health visitor    Employer:  [employer]TAINER  Tobacco Use   Smoking status: Some Days    Packs/day: 0.20    Years: 30.00    Total pack years: 6.00    Types: Cigarettes   Smokeless tobacco: Never  Vaping Use   Vaping Use: Never used  Substance and Sexual Activity   Alcohol use: No    Alcohol/week: 0.0 standard drinks of alcohol   Drug use: No   Sexual activity: Yes  Other Topics Concern   Not on file  Social History Narrative   Not on file   Social Determinants of Health   Financial Resource Strain: Not on file  Food Insecurity: Not on file  Transportation Needs: Not on file  Physical Activity: Not on file  Stress: Not on file  Social Connections: Not on file  Intimate Partner Violence: Not on file     Physical Exam   Vitals:    12/07/22 0600 12/07/22 0615  BP: (!) 181/113 (!) 145/86  Pulse: 97 96  Resp: 13 16  Temp:    SpO2: 98% 96%    CONSTITUTIONAL: Well-appearing, NAD NEURO/PSYCH:  Alert and oriented x 3, no focal deficits EYES:  eyes equal and reactive ENT/NECK:  no LAD, no JVD CARDIO: Tachycardic rate, well-perfused, normal S1 and S2 PULM: Mild diffuse wheezing, increased work of breathing GI/GU:  non-distended, non-tender MSK/SPINE:  No gross deformities, no edema SKIN:  no rash, atraumatic   *Additional and/or pertinent findings included in MDM below  Diagnostic and Interventional Summary    EKG Interpretation  Date/Time:  Thursday December 07 2022 02:48:08 EST Ventricular Rate:  115 PR Interval:  150 QRS Duration: 84 QT Interval:  312 QTC Calculation: 432 R Axis:   87 Text Interpretation: Sinus tachycardia Biatrial enlargement Anterior infarct, old Minimal ST depression, inferior leads Confirmed by Gerlene Fee [id number]) on 12/07/2022 3:10:13 AM       Labs Reviewed  CBC - Abnormal; Notable for the following components:      Result Value   RBC 6.57 (*)    MCV 68.9 (*)    MCH 21.6 (*)    RDW  19.1 (*)    All other components within normal limits  BASIC METABOLIC PANEL - Abnormal; Notable for the following components:   Glucose, Bld 128 (*)    All other components within normal limits  CULTURE, BLOOD (ROUTINE X 2)  CULTURE, BLOOD (ROUTINE X 2)  RESP PANEL BY RT-PCR (RSV, FLU A&B, COVID)  [id number]  STREP PNEUMONIAE URINARY ANTIGEN  LEGIONELLA PNEUMOPHILA SEROGP 1 UR AG  LACTIC ACID, PLASMA  LACTIC ACID, PLASMA  PROCALCITONIN  ETHANOL  RAPID URINE DRUG SCREEN, HOSP PERFORMED  VITAMIN B12  FOLATE  IRON AND TIBC  FERRITIN  RETICULOCYTES  HEMOGLOBIN A1C  TSH  CBC  CREATININE, SERUM    DG Chest Portable 1 View  Final Result      Medications  budesonide (PULMICORT) nebulizer solution 0.5 mg (has no administration in time range)  amLODipine (NORVASC) tablet 2.5 mg (has no  administration in time range)  methylPREDNISolone sodium succinate (SOLU-MEDROL) 125 mg/2 mL injection 120 mg (has no administration in time range)  enoxaparin (LOVENOX) injection 40 mg (has no administration in time range)  acetaminophen (TYLENOL) tablet 650 mg (has no administration in time range)    Or  acetaminophen (TYLENOL) suppository 650 mg (has no administration in time range)  ondansetron (ZOFRAN) tablet 4 mg (has no administration in time range)    Or  ondansetron (ZOFRAN) injection 4 mg (has no administration in time range)  ipratropium-albuterol (DUONEB) 0.5-2.5 (3) MG/3ML nebulizer solution 3 mL (3 mLs Nebulization Given 12/07/22 0405)     Procedures  /  Critical Care .Critical Care  Performed by: Maudie Flakes, MD Authorized by: Maudie Flakes, MD   Critical care provider statement:    Critical care time (minutes):  35   Critical care was necessary to treat or prevent imminent or life-threatening deterioration of the following conditions: Severe asthma exacerbation.   Critical care was time spent personally by me on the following activities:  Development of treatment plan with patient or surrogate, discussions with consultants, evaluation of patient's response to treatment, examination of patient, ordering and review of laboratory studies, ordering and review of radiographic studies, ordering and performing treatments and interventions, pulse oximetry, re-evaluation of patient's condition and review of old charts   ED Course and Medical Decision Making  Initial Impression and Ddx Seems consistent with severe asthma exacerbation.  Seems to be doing better after EMS intervention.  Still with increased work of breathing, will provide BiPAP and reassess.  Past medical/surgical history that increases complexity of ED encounter: Asthma  Interpretation of Diagnostics I personally reviewed the EKG and my interpretation is as follows: Sinus tachycardia  Labs reassuring with no  significant blood count or electrolyte disturbance  Patient Reassessment and Ultimate Disposition/Management     Patient doing well on BiPAP, will admit to medicine/stepdown.  Patient management required discussion with the following services or consulting groups:  None  Complexity of Problems Addressed Acute illness or injury that poses threat of life of bodily function  Additional Data Reviewed and Analyzed Further history obtained from: EMS on arrival  Additional Factors Impacting ED Encounter Risk Consideration of hospitalization  Daughtry Dobek. Sedonia Small, MD [place] [email or web address]  Final Clinical Impressions(s) / ED Diagnoses     ICD-10-CM   1. Severe asthma with exacerbation, unspecified whether persistent  J45.901       ED Discharge Orders     None        Discharge Instructions Discussed with and  Provided to Patient:   Discharge Instructions   None      Yossef, Retana, MD 12/07/22 [phone]

## 2022-12-07 NOTE — Progress Notes (Signed)
Patient placed on BiPAP per verbal order.

## 2022-12-08 ENCOUNTER — Other Ambulatory Visit (HOSPITAL_COMMUNITY): Payer: Self-pay

## 2022-12-08 DIAGNOSIS — J45901 Unspecified asthma with (acute) exacerbation: Secondary | ICD-10-CM

## 2022-12-08 LAB — COMPREHENSIVE METABOLIC PANEL
ALT: 17 U/L (ref 0–44)
AST: 20 U/L (ref 15–41)
Albumin: 3.2 g/dL — ABNORMAL LOW (ref 3.5–5.0)
Alkaline Phosphatase: 50 U/L (ref 38–126)
Anion gap: 7 (ref 5–15)
BUN: 13 mg/dL (ref 8–23)
CO2: 26 mmol/L (ref 22–32)
Calcium: 8.7 mg/dL — ABNORMAL LOW (ref 8.9–10.3)
Chloride: 104 mmol/L (ref 98–111)
Creatinine, Ser: 0.79 mg/dL (ref 0.61–1.24)
GFR, Estimated: >60 mL/min (ref >60)
Glucose, Bld: 118 mg/dL — ABNORMAL HIGH (ref 70–99)
Potassium: 3.9 mmol/L (ref 3.5–5.1)
Sodium: 137 mmol/L (ref 135–145)
Total Bilirubin: 0.6 mg/dL (ref 0.3–1.2)
Total Protein: 6.1 g/dL — ABNORMAL LOW (ref 6.5–8.1)

## 2022-12-08 LAB — CBC
HCT: 37.7 % — ABNORMAL LOW (ref 39.0–52.0)
Hemoglobin: 12.2 g/dL — ABNORMAL LOW (ref 13.0–17.0)
MCH: 21.7 pg — ABNORMAL LOW (ref 26.0–34.0)
MCHC: 32.4 g/dL (ref 30.0–36.0)
MCV: 67.2 fL — ABNORMAL LOW (ref 80.0–100.0)
Platelets: 281 10*3/uL (ref 150–400)
RBC: 5.61 MIL/uL (ref 4.22–5.81)
RDW: 17.8 % — ABNORMAL HIGH (ref 11.5–15.5)
WBC: 8.7 10*3/uL (ref 4.0–10.5)
nRBC: 0 % (ref 0.0–0.2)

## 2022-12-08 MED ORDER — FLUTICASONE-SALMETEROL 115-21 MCG/ACT IN AERO
2.0000 | INHALATION_SPRAY | Freq: Two times a day (BID) | RESPIRATORY_TRACT | 0 refills | Status: DC
  Filled 2022-12-08: qty 8, 30d supply, fill #0

## 2022-12-08 MED ORDER — METHYLPREDNISOLONE SODIUM SUCC 40 MG IJ SOLR
40.0000 mg | Freq: Every day | INTRAMUSCULAR | Status: DC
  Administered 2022-12-08: 40 mg via INTRAVENOUS
  Filled 2022-12-08: qty 1

## 2022-12-08 MED ORDER — ALBUTEROL SULFATE (2.5 MG/3ML) 0.083% IN NEBU
3.0000 mL | INHALATION_SOLUTION | Freq: Four times a day (QID) | RESPIRATORY_TRACT | 1 refills | Status: DC | PRN
  Filled 2022-12-08: qty 180, 15d supply, fill #0

## 2022-12-08 MED ORDER — AMLODIPINE BESYLATE 2.5 MG PO TABS
2.5000 mg | ORAL_TABLET | Freq: Every day | ORAL | 0 refills | Status: DC
  Filled 2022-12-08: qty 30, 30d supply, fill #0

## 2022-12-08 MED ORDER — ALBUTEROL SULFATE HFA 108 (90 BASE) MCG/ACT IN AERS
2.0000 | INHALATION_SPRAY | Freq: Four times a day (QID) | RESPIRATORY_TRACT | 0 refills | Status: DC | PRN
  Filled 2022-12-08: qty 6.7, 25d supply, fill #0

## 2022-12-08 MED ORDER — PREDNISONE 10 MG PO TABS
ORAL_TABLET | ORAL | 0 refills | Status: DC
  Filled 2022-12-08: qty 40, 14d supply, fill #0

## 2022-12-08 NOTE — TOC Progression Note (Signed)
Transition of Care (TOC) - Progression Note   Patient entered in [facility]  Patient Details  Name: Colton Ford MRN: [id number] Date of Birth: 01/10/1961  Transition of Care [facility]) CM/SW Contact  Latiffany Harwick, Edson Snowball, RN Phone Number: 12/08/2022, 12:13 PM  Clinical Narrative:       Expected Discharge Plan: Home/Self Care Barriers to Discharge: Continued Medical Work up  Expected Discharge Plan and Services In-house Referral: Financial Counselor Discharge Planning Services: CM Consult   Living arrangements for the past 2 months: Single Family Home Expected Discharge Date: 12/08/22                 DME Agency: NA       HH Arranged: NA           Social Determinants of Health (SDOH) Interventions SDOH Screenings   Tobacco Use: High Risk (12/07/2022)    Readmission Risk Interventions     No data to display

## 2022-12-08 NOTE — Discharge Summary (Signed)
Physician Discharge Summary   Patient: Colton Ford MRN: [id number] DOB: 1961-08-28  Admit date:     12/07/2022  Discharge date: 12/08/22  Discharge Physician: Colton Ford   PCP: Colton Rockers, MD   Recommendations at discharge:    Follow up with PCP in 1-2 weeks Follow up with Pulmonary as scheduled  Discharge Diagnoses: Principal Problem:   Acute asthma exacerbation Active Problems:   Essential hypertension   Tobacco use disorder   COPD exacerbation (HCC)   GERD without esophagitis   Asthma exacerbation   Abnormal finding on EKG/positive LVH criteria   Microcytosis  Resolved Problems:   * No resolved hospital problems. *  Hospital Course: 62 y.o. male with a Past Medical History of COPD who presented with shortness of breath for 3-4 days.  He also reports nonproductive/dry cough.  No fever.  He was brought to the ED by EMS-and required BiPAP.  He was given steroids/bronchodilators with improvement and was transitioned to nasal cannula.  TRH was asked to admit this patient for further evaluation and treatment.   Assessment and Plan: Acute asthma/COPD exacerbation No pneumonia on chest x-ray and procalcitonin less than 0.1 making it less likely that this is a pneumonia process-no indication for antibiotics -Much improvement overnight with neb tx and steroids -Weaned to room air -prescribed prednisone taper on d/c  -recommend close f/u with PCP and pulmonologist   Hypertension Patient denies utilization of medications 4 days prior to admission but blood pressure has remained consistently over 140/90 Started norvasc 2.'5mg'$  Review of EKG reveals positive LVH criteria, will check echocardiogram reviewed, normal LVEF   Ongoing tobacco abuse Cessation counseling given    Consultants:  Procedures performed:   Disposition: Home Diet recommendation:  Regular diet DISCHARGE MEDICATION: Allergies as of 12/08/2022   No Known Allergies      Medication List     STOP  taking these medications    nicotine 14 mg/24hr patch Commonly known as: NICODERM CQ - dosed in mg/24 hours   tiotropium 18 MCG inhalation capsule Commonly known as: Spiriva HandiHaler       TAKE these medications    ACID REDUCER PO Take 2 tablets by mouth daily as needed (acid reflux).   albuterol (2.5 MG/3ML) 0.083% nebulizer solution Commonly known as: PROVENTIL Use 1 vial via nebulizer every 6 (six) hours as needed for wheezing or shortness of breath.   albuterol 108 (90 Base) MCG/ACT inhaler Commonly known as: VENTOLIN HFA Inhale 2 puffs into the lungs every 6 (six) hours as needed for wheezing or shortness of breath.   amLODipine 2.5 MG tablet Commonly known as: NORVASC Take 1 tablet (2.5 mg total) by mouth daily. Start taking on: December 09, 2022   fluticasone-salmeterol 115-21 MCG/ACT inhaler Commonly known as: Advair HFA Inhale 2 puffs into the lungs 2 (two) times daily.   multivitamin tablet Take 1 tablet by mouth daily.   predniSONE 10 MG tablet Commonly known as: DELTASONE Take 6 tablets (60 mg total) by mouth daily for 3 days, THEN 4 tablets (40 mg total) daily for 3 days, THEN 2 tablets (20 mg total) daily for 3 days, THEN 1 tablet (10 mg total) daily for 3 days, THEN 0.5 tablets (5 mg total) daily for 2 days. Start taking on: December 08, 2022        Follow-up Information     Colton Ford Follow up.   Specialty: Internal Medicine Why: Ground floor of [facility] December 21, 2022 at  [place] list of medications and $25.00 Contact information: [place] [address] [phone]         Colton Rockers, MD Follow up in 2 week(s).   Specialty: Pulmonary Disease Why: Hospital follow up Contact information: [place] [address] [phone]                Discharge Exam: Colton Ford Weights   12/07/22 0250  Weight: 62.6 kg   General exam: Awake, laying in bed, in nad Respiratory  system: Normal respiratory effort, no wheezing Cardiovascular system: regular rate, s1, s2 Gastrointestinal system: Soft, nondistended, positive BS Central nervous system: CN2-12 grossly intact, strength intact Extremities: Perfused, no clubbing Skin: Normal skin turgor, no notable skin lesions seen Psychiatry: Mood normal // no visual hallucinations   Condition at discharge: fair  The results of significant diagnostics from this hospitalization (including imaging, microbiology, ancillary and laboratory) are listed below for reference.   Imaging Studies: ECHOCARDIOGRAM COMPLETE  Result Date: 12/07/2022    ECHOCARDIOGRAM REPORT   Patient Name:   Colton Ford Date of Exam: 12/07/2022 Medical Rec #:  [id number]      Height:       72.0 in Accession #:    [id number]     Weight:       138.0 lb Date of Birth:  January 14, 1961      BSA:          1.820 m Patient Age:    32 years       BP:           155/87 mmHg Patient Gender: M              HR:           86 bpm. Exam Location:  Inpatient Procedure: 2D Echo, Cardiac Doppler, 3D Echo and Color Doppler Indications:    R94.31 Abnormal EKG  History:        Patient has no prior history of Echocardiogram examinations.                 Signs/Symptoms:Shortness of Breath; Risk Factors:Hypertension                 and Current Smoker.  Sonographer:    Wilkie Aye RVT RCS Referring Phys: [place]  1. Left ventricular ejection fraction, by estimation, is 60 to 65%. Left ventricular ejection fraction by 3D volume is 60 %. The left ventricle has normal function. The left ventricle has no regional wall motion abnormalities. Left ventricular diastolic  parameters were normal.  2. Right ventricular systolic function is normal. The right ventricular size is normal. Tricuspid regurgitation signal is inadequate for assessing PA pressure.  3. The mitral valve is normal in structure. No evidence of mitral valve regurgitation. No evidence of mitral stenosis.  4. The  aortic valve is normal in structure. Aortic valve regurgitation is not visualized. No aortic stenosis is present.  5. The inferior vena cava is dilated in size with >50% respiratory variability, suggesting right atrial pressure of 8 mmHg. FINDINGS  Left Ventricle: Left ventricular ejection fraction, by estimation, is 60 to 65%. Left ventricular ejection fraction by 3D volume is 60 %. The left ventricle has normal function. The left ventricle has no regional wall motion abnormalities. The left ventricular internal cavity size was normal in size. There is no left ventricular hypertrophy. Left ventricular diastolic parameters were normal. Right Ventricle: The right ventricular size  is normal. No increase in right ventricular wall thickness. Right ventricular systolic function is normal. Tricuspid regurgitation signal is inadequate for assessing PA pressure. Left Atrium: Left atrial size was normal in size. Right Atrium: Right atrial size was normal in size. Pericardium: There is no evidence of pericardial effusion. Mitral Valve: The mitral valve is normal in structure. No evidence of mitral valve regurgitation. No evidence of mitral valve stenosis. Tricuspid Valve: The tricuspid valve is normal in structure. Tricuspid valve regurgitation is not demonstrated. No evidence of tricuspid stenosis. Aortic Valve: The aortic valve is normal in structure. Aortic valve regurgitation is not visualized. No aortic stenosis is present. Aortic valve mean gradient measures 3.0 mmHg. Aortic valve peak gradient measures 5.3 mmHg. Aortic valve area, by VTI measures 4.05 cm. Pulmonic Valve: The pulmonic valve was not well visualized. Pulmonic valve regurgitation is not visualized. No evidence of pulmonic stenosis. Aorta: The aortic root is normal in size and structure. Venous: The inferior vena cava is dilated in size with greater than 50% respiratory variability, suggesting right atrial pressure of 8 mmHg. IAS/Shunts: No atrial level  shunt detected by color flow Doppler.  LEFT VENTRICLE PLAX 2D LVIDd:         4.20 cm         Diastology LVIDs:         2.60 cm         LV e' medial:    6.64 cm/s LV PW:         1.20 cm         LV E/e' medial:  12.2 LV IVS:        1.00 cm         LV e' lateral:   9.46 cm/s LVOT diam:     2.20 cm         LV E/e' lateral: 8.5 LV SV:         80 LV SV Index:   44 LVOT Area:     3.80 cm        3D Volume EF                                LV 3D EF:    Left                                             ventricul                                             ar                                             ejection                                             fraction  by 3D                                             volume is                                             60 %.                                 3D Volume EF:                                3D EF:        60 %                                LV EDV:       151 ml                                LV ESV:       61 ml                                LV SV:        91 ml RIGHT VENTRICLE             IVC RV Basal diam:  3.90 cm     IVC diam: 3.00 cm RV S prime:     15.50 cm/s TAPSE (M-mode): 2.8 cm LEFT ATRIUM             Index        RIGHT ATRIUM           Index LA diam:        3.70 cm 2.03 cm/m   RA Area:     13.30 cm LA Vol (A2C):   42.7 ml 23.44 ml/m  RA Volume:   33.40 ml  18.35 ml/m LA Vol (A4C):   42.6 ml 23.41 ml/m LA Biplane Vol: 45.6 ml 25.06 ml/m  AORTIC VALVE AV Area (Vmax):    3.97 cm AV Area (Vmean):   3.95 cm AV Area (VTI):     4.05 cm AV Vmax:           115.00 cm/s AV Vmean:          75.100 cm/s AV VTI:            0.197 m AV Peak Grad:      5.3 mmHg AV Mean Grad:      3.0 mmHg LVOT Vmax:         120.00 cm/s LVOT Vmean:        78.100 cm/s LVOT VTI:          0.210 m LVOT/AV VTI ratio: 1.07  AORTA Ao Root diam: 3.50 cm MITRAL VALVE MV Area (PHT): 3.46 cm    SHUNTS MV Decel Time: 219 msec    Systemic VTI:  0.21 m MV E  velocity: 80.70 cm/s  Systemic Diam: 2.20  cm MV A velocity: 71.30 cm/s MV E/A ratio:  1.13 Mihai Croitoru MD Electronically signed by Sanda Klein MD Signature Date/Time: 12/07/2022/3:54:36 PM    Final    DG Chest Portable 1 View  Result Date: 12/07/2022 CLINICAL DATA:  Shortness of breath EXAM: PORTABLE CHEST 1 VIEW COMPARISON:  11/02/2022 FINDINGS: Hyperinflation. Heart and mediastinal contours are within normal limits. No focal opacities or effusions. No acute bony abnormality. Aortic atherosclerosis. IMPRESSION: Hyperinflation.  No active cardiopulmonary disease. Electronically Signed   By: Rolm Baptise M.D.   On: 12/07/2022 02:52    Microbiology: Results for orders placed or performed during the hospital encounter of 12/07/22  Culture, blood (Routine X 2) w Reflex to ID Panel     Status: None (Preliminary result)   Collection Time: 12/07/22  6:17 AM   Specimen: BLOOD RIGHT FOREARM  Result Value Ref Range Status   Specimen Description BLOOD RIGHT FOREARM  Final   Special Requests   Final    BOTTLES DRAWN AEROBIC AND ANAEROBIC Blood Culture adequate volume   Culture   Final    NO GROWTH 1 DAY Performed at [facility], [place] [address], [place], [place] [address]    Report Status PENDING  Incomplete  Resp panel by RT-PCR (RSV, Flu A&B, Covid) Anterior Nasal Swab     Status: None   Collection Time: 12/07/22  6:24 AM   Specimen: Anterior Nasal Swab  Result Value Ref Range Status   SARS Coronavirus 2 by RT PCR NEGATIVE NEGATIVE Final   Influenza A by PCR NEGATIVE NEGATIVE Final   Influenza B by PCR NEGATIVE NEGATIVE Final    Comment: (NOTE) The Xpert Xpress SARS-CoV-2/FLU/RSV plus assay is intended as an aid in the diagnosis of influenza from Nasopharyngeal swab specimens and should not be used as a sole basis for treatment. Nasal washings and aspirates are unacceptable for Xpert Xpress SARS-CoV-2/FLU/RSV testing.  Fact Sheet for  Patients: EntrepreneurPulse.com.au  Fact Sheet for Healthcare Providers: IncredibleEmployment.be  This test is not yet approved or cleared by the [place] FDA and has been authorized for detection and/or diagnosis of SARS-CoV-2 by FDA under an Emergency Use Authorization (EUA). This EUA will remain in effect (meaning this test can be used) for the duration of the COVID-19 declaration under Section 564(b)(1) of the Act, 21 U.S.C. section 360bbb-3(b)(1), unless the authorization is terminated or revoked.     Resp Syncytial Virus by PCR NEGATIVE NEGATIVE Final    Comment: (NOTE) Fact Sheet for Patients: EntrepreneurPulse.com.au  Fact Sheet for Healthcare Providers: IncredibleEmployment.be  This test is not yet approved or cleared by the [place] FDA and has been authorized for detection and/or diagnosis of SARS-CoV-2 by FDA under an Emergency Use Authorization (EUA). This EUA will remain in effect (meaning this test can be used) for the duration of the COVID-19 declaration under Section 564(b)(1) of the Act, 21 U.S.C. section 360bbb-3(b)(1), unless the authorization is terminated or revoked.  Performed at [facility], [place] [address], [place], [place] [address]   Culture, blood (Routine X 2) w Reflex to ID Panel     Status: None (Preliminary result)   Collection Time: 12/07/22  7:41 AM   Specimen: BLOOD LEFT FOREARM  Result Value Ref Range Status   Specimen Description BLOOD LEFT FOREARM  Final   Special Requests   Final    BOTTLES DRAWN AEROBIC AND ANAEROBIC Blood Culture adequate volume   Culture   Final    NO GROWTH 1  DAY Performed at [facility], [place] [address], [place], [place] [address]    Report Status PENDING  Incomplete    Labs: CBC: Recent Labs  Lab 12/07/22 0245 12/07/22 0740 12/08/22 0350  WBC 8.2 5.0 8.7  HGB 14.2 13.7 12.2*  HCT 45.3 43.5 37.7*  MCV 68.9*  68.7* 67.2*  PLT 328 300 [id number]   Basic Metabolic Panel: Recent Labs  Lab 12/07/22 0245 12/07/22 0740 12/08/22 0350  NA 138  --  137  K 4.2  --  3.9  CL 99  --  104  CO2 29  --  26  GLUCOSE 128*  --  118*  BUN 12  --  13  CREATININE 1.03 0.90 0.79  CALCIUM 9.2  --  8.7*   Liver Function Tests: Recent Labs  Lab 12/08/22 0350  AST 20  ALT 17  ALKPHOS 50  BILITOT 0.6  PROT 6.1*  ALBUMIN 3.2*   CBG: No results for input(s): "GLUCAP" in the last 168 hours.  Discharge time spent: less than 30 minutes.  Signed: Marylu Lund, MD [facility] 12/08/2022

## 2022-12-08 NOTE — Progress Notes (Signed)
Nurse requested Mobility Specialist to perform oxygen saturation test with pt which includes removing pt from oxygen both at rest and while ambulating.  Below are the results from that testing.     Patient Saturations on Room Air at Rest = spO2 97%  Patient Saturations on Room Air while Ambulating = sp02 93% .  Rested and performed pursed lip breathing for 1 minute with sp02 at N/A %.  Patient Saturations on 0 Liters of oxygen while Ambulating = sp02 93%  At end of testing pt left in room on 0  Liters of oxygen.  Reported results to nurse.

## 2022-12-08 NOTE — Hospital Course (Signed)
62 y.o. male with a Past Medical History of COPD who presented with shortness of breath for 3-4 days.  He also reports nonproductive/dry cough.  No fever.  He was brought to the ED by EMS-and required BiPAP.  He was given steroids/bronchodilators with improvement and was transitioned to nasal cannula.  TRH was asked to admit this patient for further evaluation and treatment.

## 2022-12-08 NOTE — Progress Notes (Signed)
   12/08/22 1100  Mobility  Level of Assistance Independent  Assistive Device None  Distance Ambulated (ft) 550 ft  Activity Response Tolerated well  Mobility Referral Yes  $Mobility charge 1 Mobility   Mobility Specialist Progress Note  Received pt in bed having no complaints and agreeable to mobility. Pt was asymptomatic throughout ambulation and returned to room w/o fault. Left in bed w/ call bell in reach and all needs met.   Lucious Groves Mobility Specialist  Please contact via SecureChat or Rehab office at [phone]

## 2022-12-12 LAB — CULTURE, BLOOD (ROUTINE X 2)
Culture: NO GROWTH
Culture: NO GROWTH
Special Requests: ADEQUATE
Special Requests: ADEQUATE

## 2022-12-21 ENCOUNTER — Inpatient Hospital Stay (HOSPITAL_COMMUNITY): Payer: Medicaid Other

## 2022-12-21 ENCOUNTER — Other Ambulatory Visit: Payer: Self-pay

## 2022-12-21 ENCOUNTER — Encounter: Payer: Self-pay | Admitting: Internal Medicine

## 2022-12-21 ENCOUNTER — Inpatient Hospital Stay (HOSPITAL_COMMUNITY)
Admission: EM | Admit: 2022-12-21 | Discharge: 2022-12-26 | DRG: 208 | Disposition: A | Discharge status: Home / Self Care | Payer: Medicaid Other | Attending: Family Medicine | Admitting: Family Medicine

## 2022-12-21 ENCOUNTER — Encounter (HOSPITAL_COMMUNITY): Payer: Self-pay

## 2022-12-21 ENCOUNTER — Emergency Department (HOSPITAL_COMMUNITY): Payer: Medicaid Other

## 2022-12-21 DIAGNOSIS — J9622 Acute and chronic respiratory failure with hypercapnia: Secondary | ICD-10-CM | POA: Diagnosis present

## 2022-12-21 DIAGNOSIS — J9621 Acute and chronic respiratory failure with hypoxia: Secondary | ICD-10-CM | POA: Diagnosis present

## 2022-12-21 DIAGNOSIS — J45901 Unspecified asthma with (acute) exacerbation: Secondary | ICD-10-CM | POA: Diagnosis present

## 2022-12-21 DIAGNOSIS — F17210 Nicotine dependence, cigarettes, uncomplicated: Secondary | ICD-10-CM | POA: Diagnosis present

## 2022-12-21 DIAGNOSIS — E440 Moderate protein-calorie malnutrition: Secondary | ICD-10-CM | POA: Diagnosis present

## 2022-12-21 DIAGNOSIS — Z825 Family history of asthma and other chronic lower respiratory diseases: Secondary | ICD-10-CM | POA: Diagnosis not present

## 2022-12-21 DIAGNOSIS — J441 Chronic obstructive pulmonary disease with (acute) exacerbation: Principal | ICD-10-CM

## 2022-12-21 DIAGNOSIS — Z681 Body mass index (BMI) 19 or less, adult: Secondary | ICD-10-CM | POA: Diagnosis not present

## 2022-12-21 DIAGNOSIS — Z8249 Family history of ischemic heart disease and other diseases of the circulatory system: Secondary | ICD-10-CM | POA: Diagnosis not present

## 2022-12-21 DIAGNOSIS — E872 Acidosis, unspecified: Secondary | ICD-10-CM | POA: Diagnosis present

## 2022-12-21 DIAGNOSIS — F1410 Cocaine abuse, uncomplicated: Secondary | ICD-10-CM | POA: Diagnosis present

## 2022-12-21 DIAGNOSIS — B9789 Other viral agents as the cause of diseases classified elsewhere: Secondary | ICD-10-CM | POA: Diagnosis present

## 2022-12-21 DIAGNOSIS — T380X5A Adverse effect of glucocorticoids and synthetic analogues, initial encounter: Secondary | ICD-10-CM | POA: Diagnosis present

## 2022-12-21 DIAGNOSIS — R739 Hyperglycemia, unspecified: Secondary | ICD-10-CM | POA: Diagnosis present

## 2022-12-21 DIAGNOSIS — Z1152 Encounter for screening for COVID-19: Secondary | ICD-10-CM | POA: Diagnosis not present

## 2022-12-21 DIAGNOSIS — I10 Essential (primary) hypertension: Secondary | ICD-10-CM | POA: Diagnosis present

## 2022-12-21 DIAGNOSIS — F419 Anxiety disorder, unspecified: Secondary | ICD-10-CM | POA: Diagnosis present

## 2022-12-21 DIAGNOSIS — J9601 Acute respiratory failure with hypoxia: Secondary | ICD-10-CM | POA: Diagnosis not present

## 2022-12-21 DIAGNOSIS — N179 Acute kidney failure, unspecified: Secondary | ICD-10-CM | POA: Diagnosis present

## 2022-12-21 DIAGNOSIS — D72829 Elevated white blood cell count, unspecified: Secondary | ICD-10-CM | POA: Diagnosis present

## 2022-12-21 DIAGNOSIS — J449 Chronic obstructive pulmonary disease, unspecified: Secondary | ICD-10-CM

## 2022-12-21 DIAGNOSIS — Z7951 Long term (current) use of inhaled steroids: Secondary | ICD-10-CM

## 2022-12-21 DIAGNOSIS — E875 Hyperkalemia: Secondary | ICD-10-CM | POA: Diagnosis present

## 2022-12-21 DIAGNOSIS — Z79899 Other long term (current) drug therapy: Secondary | ICD-10-CM

## 2022-12-21 DIAGNOSIS — I959 Hypotension, unspecified: Secondary | ICD-10-CM | POA: Diagnosis present

## 2022-12-21 LAB — GLUCOSE, CAPILLARY
Glucose-Capillary: 174 mg/dL — ABNORMAL HIGH (ref 70–99)
Glucose-Capillary: 130 mg/dL — ABNORMAL HIGH (ref 70–99)
Glucose-Capillary: 124 mg/dL — ABNORMAL HIGH (ref 70–99)
Glucose-Capillary: 96 mg/dL (ref 70–99)

## 2022-12-21 LAB — PROCALCITONIN: Procalcitonin: 0.37 ng/mL

## 2022-12-21 LAB — RAPID URINE DRUG SCREEN, HOSP PERFORMED
Amphetamines: NOT DETECTED
Barbiturates: NOT DETECTED
Benzodiazepines: POSITIVE — AB
Cocaine: POSITIVE — AB
Opiates: NOT DETECTED
Tetrahydrocannabinol: POSITIVE — AB

## 2022-12-21 LAB — COMPREHENSIVE METABOLIC PANEL
ALT: 62 U/L — ABNORMAL HIGH (ref 0–44)
AST: 76 U/L — ABNORMAL HIGH (ref 15–41)
Albumin: 4 g/dL (ref 3.5–5.0)
Alkaline Phosphatase: 79 U/L (ref 38–126)
Anion gap: 8 (ref 5–15)
BUN: 8 mg/dL (ref 8–23)
CO2: 27 mmol/L (ref 22–32)
Calcium: 8.9 mg/dL (ref 8.9–10.3)
Chloride: 99 mmol/L (ref 98–111)
Creatinine, Ser: 1.01 mg/dL (ref 0.61–1.24)
GFR, Estimated: >60 mL/min (ref >60)
Glucose, Bld: 237 mg/dL — ABNORMAL HIGH (ref 70–99)
Potassium: 4.1 mmol/L (ref 3.5–5.1)
Sodium: 134 mmol/L — ABNORMAL LOW (ref 135–145)
Total Bilirubin: 0.6 mg/dL (ref 0.3–1.2)
Total Protein: 7.9 g/dL (ref 6.5–8.1)

## 2022-12-21 LAB — URINALYSIS, ROUTINE W REFLEX MICROSCOPIC
Bilirubin Urine: NEGATIVE
Glucose, UA: 150 mg/dL — AB
Hgb urine dipstick: NEGATIVE
Ketones, ur: NEGATIVE mg/dL
Leukocytes,Ua: NEGATIVE
Nitrite: NEGATIVE
Protein, ur: >=300 mg/dL — AB
Specific Gravity, Urine: 1.01 (ref 1.005–1.030)
pH: 7 (ref 5.0–8.0)

## 2022-12-21 LAB — POCT I-STAT 7, (LYTES, BLD GAS, ICA,H+H)
Acid-Base Excess: 1 mmol/L (ref 0.0–2.0)
Acid-base deficit: 1 mmol/L (ref 0.0–2.0)
Bicarbonate: 30.6 mmol/L — ABNORMAL HIGH (ref 20.0–28.0)
Bicarbonate: 28.1 mmol/L — ABNORMAL HIGH (ref 20.0–28.0)
Calcium, Ion: 1.21 mmol/L (ref 1.15–1.40)
Calcium, Ion: 1.2 mmol/L (ref 1.15–1.40)
HCT: 47 % (ref 39.0–52.0)
HCT: 44 % (ref 39.0–52.0)
Hemoglobin: 16 g/dL (ref 13.0–17.0)
Hemoglobin: 15 g/dL (ref 13.0–17.0)
O2 Saturation: 100 %
O2 Saturation: 100 %
Potassium: 4.6 mmol/L (ref 3.5–5.1)
Potassium: 4.2 mmol/L (ref 3.5–5.1)
Sodium: 132 mmol/L — ABNORMAL LOW (ref 135–145)
Sodium: 131 mmol/L — ABNORMAL LOW (ref 135–145)
TCO2: 33 mmol/L — ABNORMAL HIGH (ref 22–32)
TCO2: 30 mmol/L (ref 22–32)
pCO2 arterial: 87.9 mmHg (ref 32–48)
pCO2 arterial: 55.9 mmHg — ABNORMAL HIGH (ref 32–48)
pH, Arterial: 7.15 — CL (ref 7.35–7.45)
pH, Arterial: 7.31 — ABNORMAL LOW (ref 7.35–7.45)
pO2, Arterial: 593 mmHg — ABNORMAL HIGH (ref 83–108)
pO2, Arterial: 458 mmHg — ABNORMAL HIGH (ref 83–108)

## 2022-12-21 LAB — CBC
HCT: 42.4 % (ref 39.0–52.0)
Hemoglobin: 13.1 g/dL (ref 13.0–17.0)
MCH: 21.3 pg — ABNORMAL LOW (ref 26.0–34.0)
MCHC: 30.9 g/dL (ref 30.0–36.0)
MCV: 69.1 fL — ABNORMAL LOW (ref 80.0–100.0)
Platelets: 368 10*3/uL (ref 150–400)
RBC: 6.14 MIL/uL — ABNORMAL HIGH (ref 4.22–5.81)
RDW: 18.8 % — ABNORMAL HIGH (ref 11.5–15.5)
WBC: 19.7 10*3/uL — ABNORMAL HIGH (ref 4.0–10.5)
nRBC: 0 % (ref 0.0–0.2)

## 2022-12-21 LAB — TROPONIN I (HIGH SENSITIVITY)
Troponin I (High Sensitivity): 11 ng/L (ref <18)
Troponin I (High Sensitivity): 47 ng/L — ABNORMAL HIGH (ref <18)

## 2022-12-21 LAB — RESP PANEL BY RT-PCR (RSV, FLU A&B, COVID)  RVPGX2
Influenza A by PCR: NEGATIVE
Influenza B by PCR: NEGATIVE
Resp Syncytial Virus by PCR: NEGATIVE
SARS Coronavirus 2 by RT PCR: NEGATIVE

## 2022-12-21 LAB — LACTIC ACID, PLASMA
Lactic Acid, Venous: 2.1 mmol/L (ref 0.5–1.9)
Lactic Acid, Venous: 2.8 mmol/L (ref 0.5–1.9)

## 2022-12-21 LAB — BRAIN NATRIURETIC PEPTIDE: B Natriuretic Peptide: 47.9 pg/mL (ref 0.0–100.0)

## 2022-12-21 LAB — MRSA NEXT GEN BY PCR, NASAL: MRSA by PCR Next Gen: DETECTED — AB

## 2022-12-21 MED ORDER — ROCURONIUM BROMIDE 50 MG/5ML IV SOLN
INTRAVENOUS | Status: DC | PRN
  Administered 2022-12-21: 100 mg via INTRAVENOUS

## 2022-12-21 MED ORDER — IPRATROPIUM BROMIDE 0.02 % IN SOLN
0.5000 mg | Freq: Once | RESPIRATORY_TRACT | Status: AC
  Administered 2022-12-21: 0.5 mg via RESPIRATORY_TRACT
  Filled 2022-12-21: qty 2.5

## 2022-12-21 MED ORDER — ALBUTEROL SULFATE (2.5 MG/3ML) 0.083% IN NEBU
10.0000 mg/h | INHALATION_SOLUTION | Freq: Once | RESPIRATORY_TRACT | Status: AC
  Administered 2022-12-21: 10 mg/h via RESPIRATORY_TRACT

## 2022-12-21 MED ORDER — IPRATROPIUM-ALBUTEROL 0.5-2.5 (3) MG/3ML IN SOLN
3.0000 mL | Freq: Once | RESPIRATORY_TRACT | Status: AC
  Administered 2022-12-21: 3 mL via RESPIRATORY_TRACT
  Filled 2022-12-21: qty 3

## 2022-12-21 MED ORDER — ARFORMOTEROL TARTRATE 15 MCG/2ML IN NEBU
15.0000 ug | INHALATION_SOLUTION | Freq: Two times a day (BID) | RESPIRATORY_TRACT | Status: DC
  Administered 2022-12-21 – 2022-12-25 (×9): 15 ug via RESPIRATORY_TRACT
  Filled 2022-12-21 (×9): qty 2

## 2022-12-21 MED ORDER — DOCUSATE SODIUM 100 MG PO CAPS: 100.0000 mg | ORAL_CAPSULE | Freq: Two times a day (BID) | ORAL | Status: DC | PRN

## 2022-12-21 MED ORDER — ORAL CARE MOUTH RINSE
15.0000 mL | OROMUCOSAL | Status: DC
  Administered 2022-12-21 – 2022-12-23 (×25): 15 mL via OROMUCOSAL

## 2022-12-21 MED ORDER — MUPIROCIN 2 % EX OINT
1.0000 | TOPICAL_OINTMENT | Freq: Two times a day (BID) | CUTANEOUS | Status: AC
  Administered 2022-12-21 – 2022-12-26 (×10): 1 via NASAL
  Filled 2022-12-21 (×3): qty 22

## 2022-12-21 MED ORDER — ORAL CARE MOUTH RINSE: 15.0000 mL | OROMUCOSAL | Status: DC | PRN

## 2022-12-21 MED ORDER — INSULIN ASPART 100 UNIT/ML IJ SOLN
0.0000 [IU] | INTRAMUSCULAR | Status: DC
  Administered 2022-12-21: 3 [IU] via SUBCUTANEOUS
  Administered 2022-12-21 – 2022-12-22 (×3): 2 [IU] via SUBCUTANEOUS
  Administered 2022-12-22: 3 [IU] via SUBCUTANEOUS
  Administered 2022-12-23 (×3): 2 [IU] via SUBCUTANEOUS
  Administered 2022-12-23 – 2022-12-24 (×2): 3 [IU] via SUBCUTANEOUS
  Administered 2022-12-24: 1 [IU] via SUBCUTANEOUS
  Administered 2022-12-24 (×2): 3 [IU] via SUBCUTANEOUS

## 2022-12-21 MED ORDER — FENTANYL CITRATE PF 50 MCG/ML IJ SOSY
50.0000 ug | PREFILLED_SYRINGE | INTRAMUSCULAR | Status: DC | PRN
  Administered 2022-12-21 (×2): 50 ug via INTRAVENOUS
  Filled 2022-12-21 (×2): qty 1

## 2022-12-21 MED ORDER — PANTOPRAZOLE SODIUM 40 MG PO TBEC: 40.0000 mg | DELAYED_RELEASE_TABLET | Freq: Every day | ORAL | Status: DC

## 2022-12-21 MED ORDER — BUDESONIDE 0.25 MG/2ML IN SUSP
0.2500 mg | Freq: Two times a day (BID) | RESPIRATORY_TRACT | Status: DC
  Administered 2022-12-21 – 2022-12-26 (×11): 0.25 mg via RESPIRATORY_TRACT
  Filled 2022-12-21 (×11): qty 2

## 2022-12-21 MED ORDER — SODIUM CHLORIDE 0.9 % IV SOLN
INTRAVENOUS | Status: DC | PRN
  Administered 2022-12-21: 10 mL via INTRAVENOUS

## 2022-12-21 MED ORDER — MAGNESIUM SULFATE 2 GM/50ML IV SOLN
2.0000 g | Freq: Once | INTRAVENOUS | Status: AC
  Administered 2022-12-21: 2 g via INTRAVENOUS
  Filled 2022-12-21: qty 50

## 2022-12-21 MED ORDER — ETOMIDATE 2 MG/ML IV SOLN
INTRAVENOUS | Status: DC | PRN
  Administered 2022-12-21: 20 mg via INTRAVENOUS

## 2022-12-21 MED ORDER — METHYLPREDNISOLONE SODIUM SUCC 40 MG IJ SOLR
40.0000 mg | Freq: Two times a day (BID) | INTRAMUSCULAR | Status: DC
  Administered 2022-12-21 – 2022-12-26 (×10): 40 mg via INTRAVENOUS
  Filled 2022-12-21 (×10): qty 1

## 2022-12-21 MED ORDER — MIDAZOLAM HCL (PF) 10 MG/2ML IJ SOLN
INTRAMUSCULAR | Status: AC
  Filled 2022-12-21: qty 2

## 2022-12-21 MED ORDER — MIDAZOLAM HCL 2 MG/2ML IJ SOLN
INTRAMUSCULAR | Status: DC | PRN
  Administered 2022-12-21: 5 mg via INTRAVENOUS

## 2022-12-21 MED ORDER — FENTANYL CITRATE PF 50 MCG/ML IJ SOSY: 50.0000 ug | PREFILLED_SYRINGE | INTRAMUSCULAR | Status: DC | PRN

## 2022-12-21 MED ORDER — FENTANYL CITRATE PF 50 MCG/ML IJ SOSY
50.0000 ug | PREFILLED_SYRINGE | Freq: Once | INTRAMUSCULAR | Status: AC
  Administered 2022-12-21: 50 ug via INTRAVENOUS
  Filled 2022-12-21: qty 1

## 2022-12-21 MED ORDER — FENTANYL BOLUS VIA INFUSION: 50.0000 ug | INTRAVENOUS | Status: DC | PRN

## 2022-12-21 MED ORDER — POLYETHYLENE GLYCOL 3350 17 G PO PACK
17.0000 g | PACK | Freq: Every day | ORAL | Status: DC
  Administered 2022-12-21 – 2022-12-25 (×5): 17 g
  Filled 2022-12-21 (×6): qty 1

## 2022-12-21 MED ORDER — CHLORHEXIDINE GLUCONATE CLOTH 2 % EX PADS
6.0000 | MEDICATED_PAD | Freq: Every day | CUTANEOUS | Status: DC
  Administered 2022-12-21 – 2022-12-26 (×5): 6 via TOPICAL

## 2022-12-21 MED ORDER — DOCUSATE SODIUM 50 MG/5ML PO LIQD: 100.0000 mg | Freq: Two times a day (BID) | ORAL | Status: DC | PRN

## 2022-12-21 MED ORDER — PROPOFOL 1000 MG/100ML IV EMUL
5.0000 ug/kg/min | INTRAVENOUS | Status: DC
  Administered 2022-12-21: 10 ug/kg/min via INTRAVENOUS

## 2022-12-21 MED ORDER — FENTANYL 2500MCG IN NS 250ML (10MCG/ML) PREMIX INFUSION
50.0000 ug/h | INTRAVENOUS | Status: DC
  Administered 2022-12-21: 50 ug/h via INTRAVENOUS
  Administered 2022-12-22: 150 ug/h via INTRAVENOUS
  Administered 2022-12-23 (×2): 100 ug/h via INTRAVENOUS
  Filled 2022-12-21 (×3): qty 250

## 2022-12-21 MED ORDER — PROPOFOL 1000 MG/100ML IV EMUL
0.0000 ug/kg/min | INTRAVENOUS | Status: DC
  Administered 2022-12-21: 30 ug/kg/min via INTRAVENOUS
  Administered 2022-12-21: 40 ug/kg/min via INTRAVENOUS
  Administered 2022-12-22: 19.968 ug/kg/min via INTRAVENOUS
  Administered 2022-12-22: 35 ug/kg/min via INTRAVENOUS
  Administered 2022-12-22: 30 ug/kg/min via INTRAVENOUS
  Administered 2022-12-23: 35 ug/kg/min via INTRAVENOUS
  Filled 2022-12-21 (×6): qty 100

## 2022-12-21 MED ORDER — REVEFENACIN 175 MCG/3ML IN SOLN
175.0000 ug | Freq: Every day | RESPIRATORY_TRACT | Status: DC
  Administered 2022-12-21 – 2022-12-25 (×5): 175 ug via RESPIRATORY_TRACT
  Filled 2022-12-21 (×5): qty 3

## 2022-12-21 MED ORDER — FAMOTIDINE 20 MG PO TABS
20.0000 mg | ORAL_TABLET | Freq: Two times a day (BID) | ORAL | Status: DC
  Administered 2022-12-21 (×2): 20 mg
  Filled 2022-12-21 (×2): qty 1

## 2022-12-21 MED ORDER — POLYETHYLENE GLYCOL 3350 17 G PO PACK: 17.0000 g | PACK | Freq: Every day | ORAL | Status: DC | PRN

## 2022-12-21 MED ORDER — HEPARIN SODIUM (PORCINE) 5000 UNIT/ML IJ SOLN
5000.0000 [IU] | Freq: Three times a day (TID) | INTRAMUSCULAR | Status: DC
  Administered 2022-12-21 – 2022-12-25 (×14): 5000 [IU] via SUBCUTANEOUS
  Filled 2022-12-21 (×14): qty 1

## 2022-12-21 MED ORDER — DEXTROSE 5 % IV SOLN
250.0000 mg | INTRAVENOUS | Status: AC
  Administered 2022-12-21 – 2022-12-25 (×5): 250 mg via INTRAVENOUS
  Filled 2022-12-21 (×5): qty 2.5

## 2022-12-21 NOTE — Progress Notes (Signed)
ABG results 15 minutes on bipap with an IPAP 18 EPAP 8 FIO2 100% with a spontaneous RR of 24. Pt unable to speak, more lethargic.  pH: 7.15 PaCO2 87.9 PaO2 593 HCO3 30.6  Critical results given to ED MD, orders to intubate.

## 2022-12-21 NOTE — H&P (Signed)
NAME:  Colton Ford, MRN:  [id number], DOB:  [birth year], LOS: 0 ADMISSION DATE:  12/21/2022, CONSULTATION DATE:  12/24/2022 REFERRING MD:  Dr. Wyvonnia Dusky, CHIEF COMPLAINT:  SOB, intubated    History of Present Illness:  Colton Ford is a 62 y.o. male with a past medical history significant for COPD/asthma, tobacco abuse, hypertension, and anemia who presented to the emergency room 3/21 for complaints of shortness of breath.  Per EMS SpO2 mid 80s on room air with inability to speak in full sentences, patient received bronchodilators, Solu-Medrol, CM and IM epi prior to arrival.  Of note patient has been treated at this medical facility multiple times for acute COPD exasperation's requiring BiPAP and/or intubation.  On arrival to ED patient seen diaphoretic, tachypneic, tachycardic, and hypertensive on BiPAP.  All lab work pending at time of assessment.  Given acute respiratory distress patient was intubated by EDP.  Postintubation chest x-ray with possible trace left pleural effusion.  Critical care consulted for further management and admission.  Pertinent  Medical History  COPD/asthma Tobacco abuse Hypertension Anemia  Significant Hospital Events: Including procedures, antibiotic start and stop dates in addition to other pertinent events   3/21 presented in acute respiratory distress resulting in immediate intubation upon ED arrival  Interim History / Subjective:  As above  Objective   Blood pressure (!) 186/118, pulse (!) 116, temperature (!) 94.6 F (34.8 C), temperature source Axillary, resp. rate (!) 28, height 6' (1.829 m), weight 62.6 kg, SpO2 100 %.       No intake or output data in the 24 hours ending 12/21/22 1015 Filed Weights   12/21/22 0948  Weight: 62.6 kg    Examination: General: Acute ill appearing middle aged male lying in bed on mechanical ventilation, in NAD HEENT: ETT, MM pink/moist, PERRL,  Neuro: Sedated and likely still paralyzed post intubation  CV: s1s2  regular rate and rhythm, no murmur, rubs, or gallops,  PULM:  Clear to auscultation, no wheezing, tolerating vent  GI: soft, bowel sounds active in all 4 quadrants, non-tender, non-distended Extremities: warm/dry, no edema  Skin: no rashes or lesions   Resolved Hospital Problem list     Assessment & Plan:  Acute Hypoxic Respiratory Failure  -In acute respiratory distress despite BiPAP on ED arrival resulting in immediate intubation Acute COPD/Asthma exacerbation  -Per EMS SpO2 mid 80s on room air with inability to speak in full sentences, patient received bronchodilators, Solu-Medrol, CM and IM epi prior to arrival.  Of note patient has been treated at this medical facility multiple times for acute COPD exasperation's requiring BiPAP and/or intubation. P: Continue ventilator support with lung protective strategies  Wean PEEP and FiO2 for sats greater than 90%. Head of bed elevated 30 degrees. Plateau pressures less than 30 cm H20.  Follow intermittent chest x-ray and ABG.   SAT/SBT as tolerated, mentation preclude extubation  Ensure adequate pulmonary hygiene  Follow cultures  VAP bundle in place  PAD protocol Continue bronchodilators Steroids  Empiric Azithromycin   Lactic acidosis  -suspect seciondary to stress reaction in acute srespiratory distress  P: Trend  Low threshold for ABTs  History of hypertension -Home medications include Norvasc P: Closely monitor hemodynamic stability with sedation required for intubation home antihypertensives as able  Substance abuse  Tobacco use  Positive cocaine 12/07/22 P: Cessation education when appropriate   Best Practice (right click and "Reselect all SmartList Selections" daily)   Diet/type: NPO DVT prophylaxis: prophylactic heparin  GI prophylaxis: PPI Lines:  N/A Foley:  Yes, and it is still needed Code Status:  full code Last date of multidisciplinary goals of care discussion: No family at bedside at time of admit    Labs   CBC: No results for input(s): "WBC", "NEUTROABS", "HGB", "HCT", "MCV", "PLT" in the last 168 hours.  Basic Metabolic Panel: No results for input(s): "NA", "K", "CL", "CO2", "GLUCOSE", "BUN", "CREATININE", "CALCIUM", "MG", "PHOS" in the last 168 hours. GFR: Estimated Creatinine Clearance: 83.7 mL/min (by C-G formula based on SCr of 0.79 mg/dL). No results for input(s): "PROCALCITON", "WBC", "LATICACIDVEN" in the last 168 hours.  Liver Function Tests: No results for input(s): "AST", "ALT", "ALKPHOS", "BILITOT", "PROT", "ALBUMIN" in the last 168 hours. No results for input(s): "LIPASE", "AMYLASE" in the last 168 hours. No results for input(s): "AMMONIA" in the last 168 hours.  ABG    Component Value Date/Time   PHART 7.285 (L) 04/02/2011 1001   PCO2ART 52.6 (H) 04/02/2011 1001   PO2ART 72.0 (L) 04/02/2011 1001   HCO3 25.9 08/08/2022 0257   TCO2 27 08/08/2022 0257   ACIDBASEDEF 1.0 08/08/2022 0257   O2SAT 72 08/08/2022 0257     Coagulation Profile: No results for input(s): "INR", "PROTIME" in the last 168 hours.  Cardiac Enzymes: No results for input(s): "CKTOTAL", "CKMB", "CKMBINDEX", "TROPONINI" in the last 168 hours.  HbA1C: Hgb A1c MFr Bld  Date/Time Value Ref Range Status  12/07/2022 07:40 AM 6.2 (H) 4.8 - 5.6 % Final    Comment:    (NOTE)         Prediabetes: 5.7 - 6.4         Diabetes: >6.4         Glycemic control for adults with diabetes: <7.0   08/08/2022 02:40 AM 5.9 (H) 4.8 - 5.6 % Final    Comment:    (NOTE) Pre diabetes:          5.7%-6.4%  Diabetes:              >6.4%  Glycemic control for   <7.0% adults with diabetes     CBG: No results for input(s): "GLUCAP" in the last 168 hours.  Review of Systems:   Unable to assess   Past Medical History:  He,  has a past medical history of Agitation, Asthma, and Mild anemia.   Surgical History:  History reviewed. No pertinent surgical history.   Social History:   reports that he has been  smoking cigarettes. He has a 6.00 pack-year smoking history. He has never used smokeless tobacco. He reports that he does not drink alcohol and does not use drugs.   Family History:  His family history includes Asthma in his sister; Heart disease in his brother and sister.   Allergies No Known Allergies   Home Medications  Prior to Admission medications   Medication Sig Start Date End Date Taking? Authorizing Provider  albuterol (PROVENTIL) (2.5 MG/3ML) 0.083% nebulizer solution Use 1 vial via nebulizer every 6 (six) hours as needed for wheezing or shortness of breath. 12/08/22 02/06/23  Donne Hazel, MD  albuterol (VENTOLIN HFA) 108 (90 Base) MCG/ACT inhaler Inhale 2 puffs into the lungs every 6 (six) hours as needed for wheezing or shortness of breath. 12/08/22   Donne Hazel, MD  amLODipine (NORVASC) 2.5 MG tablet Take 1 tablet (2.5 mg total) by mouth daily. 12/09/22 01/08/23  Donne Hazel, MD  fluticasone-salmeterol (ADVAIR HFA) [phone] MCG/ACT inhaler Inhale 2 puffs into the lungs 2 (two) times daily. 12/08/22  Donne Hazel, MD  Multiple Vitamin (MULTIVITAMIN) tablet Take 1 tablet by mouth daily.    [provider]  predniSONE (DELTASONE) 10 MG tablet Take 6 tablets (60 mg total) by mouth daily for 3 days, THEN 4 tablets (40 mg total) daily for 3 days, THEN 2 tablets (20 mg total) daily for 3 days, THEN 1 tablet (10 mg total) daily for 3 days, THEN 0.5 tablets (5 mg total) daily for 2 days. 12/08/22 12/22/22  Donne Hazel, MD  raNITIdine HCl (ACID REDUCER PO) Take 2 tablets by mouth daily as needed (acid reflux).    [provider]     Critical care time:   CRITICAL CARE Performed by: Kuulei Kleier D. Harris   Total critical care time: 42 minutes  Critical care time was exclusive of separately billable procedures and treating other patients.  Critical care was necessary to treat or prevent imminent or life-threatening deterioration.  Critical care was time spent  personally by me on the following activities: development of treatment plan with patient and/or surrogate as well as nursing, discussions with consultants, evaluation of patient's response to treatment, examination of patient, obtaining history from patient or surrogate, ordering and performing treatments and interventions, ordering and review of laboratory studies, ordering and review of radiographic studies, pulse oximetry and re-evaluation of patient's condition.  Cayne Yom D. Harris, NP-C [facility] Personal contact information can be found on Amion  If no contact or response made please call [phone] 12/21/2022, 10:26 AM

## 2022-12-21 NOTE — ED Provider Notes (Signed)
[facility] Provider Note   CSN: [id number] Arrival date & time: 12/21/22  [id number]     History  Chief Complaint  Patient presents with   Shortness of Breath    Colton Ford is a 62 y.o. male.  Level 5 caveat for severe respiratory distress.  Patient brought in by EMS with respiratory distress onset this morning.  Does have a history of asthma.  Unable to give any history.  EMS reports speaking 1 word phrases and that sats in the mid 80s on room air.  He was given bronchodilators, 125 mg of Solu-Medrol, 2 g of magnesium and epinephrine IM x 2.  On arrival he is diaphoretic, tachypneic, unable to speak.  No reported chest pain.  No leg pain or leg swelling.  No history of hypertension or CHF.  The history is provided by the patient and the EMS personnel. The history is limited by the condition of the patient.  Shortness of Breath      Home Medications Prior to Admission medications   Medication Sig Start Date End Date Taking? Authorizing Provider  albuterol (PROVENTIL) (2.5 MG/3ML) 0.083% nebulizer solution Use 1 vial via nebulizer every 6 (six) hours as needed for wheezing or shortness of breath. 12/08/22 02/06/23  Donne Hazel, MD  albuterol (VENTOLIN HFA) 108 (90 Base) MCG/ACT inhaler Inhale 2 puffs into the lungs every 6 (six) hours as needed for wheezing or shortness of breath. 12/08/22   Donne Hazel, MD  amLODipine (NORVASC) 2.5 MG tablet Take 1 tablet (2.5 mg total) by mouth daily. 12/09/22 01/08/23  Donne Hazel, MD  fluticasone-salmeterol (ADVAIR HFA) [phone] MCG/ACT inhaler Inhale 2 puffs into the lungs 2 (two) times daily. 12/08/22   Donne Hazel, MD  Multiple Vitamin (MULTIVITAMIN) tablet Take 1 tablet by mouth daily.    [provider]  predniSONE (DELTASONE) 10 MG tablet Take 6 tablets (60 mg total) by mouth daily for 3 days, THEN 4 tablets (40 mg total) daily for 3 days, THEN 2 tablets (20 mg total) daily for 3 days,  THEN 1 tablet (10 mg total) daily for 3 days, THEN 0.5 tablets (5 mg total) daily for 2 days. 12/08/22 12/22/22  Donne Hazel, MD  raNITIdine HCl (ACID REDUCER PO) Take 2 tablets by mouth daily as needed (acid reflux).    [provider]      Allergies    Patient has no known allergies.    Review of Systems   Review of Systems  Unable to perform ROS: Severe respiratory distress  Respiratory:  Positive for shortness of breath.     Physical Exam Updated Vital Signs BP 110/74   Pulse 76   Temp (!) 96.4 F (35.8 C) (Axillary)   Resp 12   Ht 6' (1.829 m)   Wt 62.6 kg   SpO2 98%   BMI 18.72 kg/m  Physical Exam Vitals and nursing note reviewed.  Constitutional:      General: He is in acute distress.     Appearance: He is well-developed. He is ill-appearing, toxic-appearing and diaphoretic.     Comments: Tachypneic, unable to speak, unable to give any history.  HENT:     Head: Normocephalic and atraumatic.     Mouth/Throat:     Pharynx: No oropharyngeal exudate.  Eyes:     Conjunctiva/sclera: Conjunctivae normal.     Pupils: Pupils are equal, round, and reactive to light.  Neck:  Comments: No meningismus. Cardiovascular:     Rate and Rhythm: Regular rhythm. Tachycardia present.     Heart sounds: Normal heart sounds. No murmur heard. Pulmonary:     Effort: Respiratory distress present.     Breath sounds: Wheezing present.     Comments: Severe respiratory distress, expiratory wheezing, minimal air movement Abdominal:     Palpations: Abdomen is soft.     Tenderness: There is no abdominal tenderness. There is no guarding or rebound.  Musculoskeletal:        General: No tenderness. Normal range of motion.     Cervical back: Normal range of motion and neck supple.  Skin:    General: Skin is warm.  Neurological:     Mental Status: He is alert and oriented to person, place, and time.     Cranial Nerves: No cranial nerve deficit.     Motor: No abnormal muscle  tone.     Coordination: Coordination normal.     Comments: No ataxia on finger to nose bilaterally. No pronator drift. 5/5 strength throughout. CN 2-12 intact.Equal grip strength. Sensation intact.   Psychiatric:        Behavior: Behavior normal.     ED Results / Procedures / Treatments   Labs (all labs ordered are listed, but only abnormal results are displayed) Labs Reviewed  MRSA NEXT GEN BY PCR, NASAL - Abnormal; Notable for the following components:      Result Value   MRSA by PCR Next Gen DETECTED (*)    All other components within normal limits  COMPREHENSIVE METABOLIC PANEL - Abnormal; Notable for the following components:   Sodium 134 (*)    Glucose, Bld 237 (*)    AST 76 (*)    ALT 62 (*)    All other components within normal limits  LACTIC ACID, PLASMA - Abnormal; Notable for the following components:   Lactic Acid, Venous 2.1 (*)    All other components within normal limits  LACTIC ACID, PLASMA - Abnormal; Notable for the following components:   Lactic Acid, Venous 2.8 (*)    All other components within normal limits  RAPID URINE DRUG SCREEN, HOSP PERFORMED - Abnormal; Notable for the following components:   Cocaine POSITIVE (*)    Benzodiazepines POSITIVE (*)    Tetrahydrocannabinol POSITIVE (*)    All other components within normal limits  URINALYSIS, ROUTINE W REFLEX MICROSCOPIC - Abnormal; Notable for the following components:   APPearance HAZY (*)    Glucose, UA 150 (*)    Protein, ur >=300 (*)    Bacteria, UA RARE (*)    All other components within normal limits  CBC - Abnormal; Notable for the following components:   WBC 19.7 (*)    RBC 6.14 (*)    MCV 69.1 (*)    MCH 21.3 (*)    RDW 18.8 (*)    All other components within normal limits  GLUCOSE, CAPILLARY - Abnormal; Notable for the following components:   Glucose-Capillary 174 (*)    All other components within normal limits  GLUCOSE, CAPILLARY - Abnormal; Notable for the following components:    Glucose-Capillary 130 (*)    All other components within normal limits  POCT I-STAT 7, (LYTES, BLD GAS, ICA,H+H) - Abnormal; Notable for the following components:   pH, Arterial 7.150 (*)    pCO2 arterial 87.9 (*)    pO2, Arterial 593 (*)    Bicarbonate 30.6 (*)    TCO2 33 (*)  Sodium 132 (*)    All other components within normal limits  POCT I-STAT 7, (LYTES, BLD GAS, ICA,H+H) - Abnormal; Notable for the following components:   pH, Arterial 7.310 (*)    pCO2 arterial 55.9 (*)    pO2, Arterial 458 (*)    Bicarbonate 28.1 (*)    Sodium 131 (*)    All other components within normal limits  TROPONIN I (HIGH SENSITIVITY) - Abnormal; Notable for the following components:   Troponin I (High Sensitivity) 47 (*)    All other components within normal limits  RESP PANEL BY RT-PCR (RSV, FLU A&B, COVID)  [id number]  CULTURE, BLOOD (ROUTINE X 2)  CULTURE, BLOOD (ROUTINE X 2)  BRAIN NATRIURETIC PEPTIDE  PROCALCITONIN  BLOOD GAS, ARTERIAL  BLOOD GAS, ARTERIAL  I-STAT ARTERIAL BLOOD GAS, ED  TROPONIN I (HIGH SENSITIVITY)    EKG EKG Interpretation  Date/Time:  Thursday December 21 2022 09:22:42 EDT Ventricular Rate:  116 PR Interval:  139 QRS Duration: 78 QT Interval:  321 QTC Calculation: 446 R Axis:   46 Text Interpretation: Sinus tachycardia Ventricular premature complex Aberrant conduction of SV complex(es) Biatrial enlargement Probable left ventricular hypertrophy Anterior Q waves, possibly due to LVH No significant change was found Confirmed by Ezequiel Essex [phone] on 12/21/2022 9:34:29 AM  Radiology DG Abd Portable 1V  Result Date: 12/21/2022 CLINICAL DATA:  NG tube placement EXAM: PORTABLE ABDOMEN - 1 VIEW COMPARISON:  Previous studies including the examination done earlier today FINDINGS: Cardiac size is within normal limits. Low position of diaphragms suggests COPD. Upper lung fields are not included in the image. Visualized lung fields are clear of any infiltrates or pulmonary  edema. Tip and side port in NG tube noted in the stomach. IMPRESSION: Tip and side port in NG tube are noted within the stomach. Electronically Signed   By: Elmer Picker M.D.   On: 12/21/2022 12:32   DG Chest Portable 1 View  Result Date: 12/21/2022 CLINICAL DATA:  Verify tube placement. EXAM: PORTABLE CHEST 1 VIEW COMPARISON:  Chest radiograph performed earlier on the same date at 9:56 a.m. FINDINGS: The heart size and mediastinal contours are within normal limits. Aortic atherosclerotic calcifications. Endotracheal tube with distal tip below the clavicular heads, unchanged. Interval placement of feeding tube with side port below the GE junction and distal tip not included. No acute osseous abnormality. IMPRESSION: Interval placement of feeding tube with side port below the GE junction and distal tip not included. No other significant interval change. Electronically Signed   By: Keane Police D.O.   On: 12/21/2022 11:41   DG Chest Portable 1 View  Result Date: 12/21/2022 CLINICAL DATA:  Provided history: Shortness of breath. EXAM: PORTABLE CHEST 1 VIEW COMPARISON:  Prior chest radiographs 12/07/2022 and earlier. FINDINGS: ET tube present with tip immediately below the level of the clavicular heads. Heart size within normal limits. Aortic atherosclerosis. No appreciable airspace consolidation. Subtle blunting of the left lateral costophrenic angle, which may reflect presence of a trace left pleural effusion. No evidence of pneumothorax. No acute osseous abnormality identified. IMPRESSION: 1. ET tube present with tip just below the level of the clavicular heads. 2. Possible trace left pleural effusion. 3. Aortic Atherosclerosis (ICD10-I70.0). Electronically Signed   By: Kellie Simmering D.O.   On: 12/21/2022 10:11    Procedures Procedure Name: Intubation Date/Time: 12/21/2022 9:48 AM  Performed by: Ezequiel Essex, MDPre-anesthesia Checklist: Patient identified, Patient being monitored, Emergency  Drugs available, Timeout performed and Suction available Oxygen Delivery  Method: Non-rebreather mask Preoxygenation: Pre-oxygenation with 100% oxygen Induction Type: Rapid sequence Ventilation: Mask ventilation without difficulty Laryngoscope Size: Glidescope and 4 Grade View: Grade I Tube size: 8.0 mm Number of attempts: 1 Airway Equipment and Method: Patient positioned with wedge pillow, Video-laryngoscopy and Rigid stylet Placement Confirmation: ETT inserted through vocal cords under direct vision, CO2 detector and Breath sounds checked- equal and bilateral Secured at: 25 cm Tube secured with: ETT holder Dental Injury: Teeth and Oropharynx as per pre-operative assessment  Difficulty Due To: Difficulty was anticipated Future Recommendations: Recommend- induction with short-acting agent, and alternative techniques readily available    .Critical Care  Performed by: Ezequiel Essex, MD Authorized by: Ezequiel Essex, MD   Critical care provider statement:    Critical care time (minutes):  60   Critical care time was exclusive of:  Separately billable procedures and treating other patients   Critical care was necessary to treat or prevent imminent or life-threatening deterioration of the following conditions:  Respiratory failure   Critical care was time spent personally by me on the following activities:  Development of treatment plan with patient or surrogate, discussions with consultants, evaluation of patient's response to treatment, examination of patient, ordering and review of laboratory studies, ordering and review of radiographic studies, ordering and performing treatments and interventions, pulse oximetry, re-evaluation of patient's condition, review of old charts and blood draw for specimens   I assumed direction of critical care for this patient from another provider in my specialty: no     Care discussed with: admitting provider       Medications Ordered in  ED Medications  ipratropium-albuterol (DUONEB) 0.5-2.5 (3) MG/3ML nebulizer solution 3 mL (has no administration in time range)  albuterol (PROVENTIL) (2.5 MG/3ML) 0.083% nebulizer solution (has no administration in time range)  ipratropium (ATROVENT) nebulizer solution 0.5 mg (has no administration in time range)  magnesium sulfate IVPB 2 g 50 mL (has no administration in time range)    ED Course/ Medical Decision Making/ A&P                             Medical Decision Making Amount and/or Complexity of Data Reviewed Independent Historian: EMS Labs: ordered. Decision-making details documented in ED Course. Radiology: ordered and independent interpretation performed. Decision-making details documented in ED Course. ECG/medicine tests: ordered and independent interpretation performed. Decision-making details documented in ED Course.  Risk Prescription drug management. Decision regarding hospitalization.   \Respiratory distress likely secondary to asthma/COPD exacerbation, unable to give a history.  Placed on BiPAP on arrival.  Continue bronchodilators, steroids and magnesium.  May require intubation if does not improve.  ABG 7.1, pCO2 88. Patient obtunded on bipap. Still tachypneic and in respiratory distress.  Decision to intubate.   CXR without edema or infiltrate. Results reviewed and interpreted by me.  Labs with leukocytosis and hyperglycemia without DKA.   Work of breathing and wheezing improved on ventilator. Continue steroids, magnesium, bronchodilators.   Admission d/w critical care team. Wife updated.       Final Clinical Impression(s) / ED Diagnoses Final diagnoses:  Acute respiratory failure with hypoxia ([place])  COPD exacerbation ([place])    Rx / DC Orders ED Discharge Orders     None         Heydi Swango, Annie Main, MD 12/21/22 1657

## 2022-12-21 NOTE — Progress Notes (Signed)
Pt transported to 47M 11 from the ED. Pt woke up during transport, on arrival to ICU sedation increased by ICU RN. Handoff given to ICU RRT.

## 2022-12-21 NOTE — Progress Notes (Signed)
ABG results obtained 1 hr after being on mechanical ventilation with settings listed below:  PRVC vT 620 RR 20 iTime 0.7 PEEP 8 FIO2 100%  pH 7.31 PaCO2 55.9 PaO2 458 HCO3 28.1  No changes made, pt not breathing over at this time.

## 2022-12-21 NOTE — Progress Notes (Deleted)
CC: hospital follow up  HPI:  [name] is a 62 y.o. male living with a history stated below and presents today for a hospital follow up. The patient was hospitalized from 3/7-3/8 for an asthma/COPD exacerbation and was treated with a prednisone taper and nebulizer treatments. Please see problem based assessment and plan for additional details.  Past Medical History:  Diagnosis Date   Agitation    Asthma    Mild anemia     Current Outpatient Medications on File Prior to Visit  Medication Sig Dispense Refill   albuterol (PROVENTIL) (2.5 MG/3ML) 0.083% nebulizer solution Use 1 vial via nebulizer every 6 (six) hours as needed for wheezing or shortness of breath. 360 mL 1   albuterol (VENTOLIN HFA) 108 (90 Base) MCG/ACT inhaler Inhale 2 puffs into the lungs every 6 (six) hours as needed for wheezing or shortness of breath. 6.7 g 0   amLODipine (NORVASC) 2.5 MG tablet Take 1 tablet (2.5 mg total) by mouth daily. 30 tablet 0   fluticasone-salmeterol (ADVAIR HFA) 115-21 MCG/ACT inhaler Inhale 2 puffs into the lungs 2 (two) times daily. 12 g 0   Multiple Vitamin (MULTIVITAMIN) tablet Take 1 tablet by mouth daily.     predniSONE (DELTASONE) 10 MG tablet Take 6 tablets (60 mg total) by mouth daily for 3 days, THEN 4 tablets (40 mg total) daily for 3 days, THEN 2 tablets (20 mg total) daily for 3 days, THEN 1 tablet (10 mg total) daily for 3 days, THEN 0.5 tablets (5 mg total) daily for 2 days. 40 tablet 0   raNITIdine HCl (ACID REDUCER PO) Take 2 tablets by mouth daily as needed (acid reflux).     No current facility-administered medications on file prior to visit.    Family History  Problem Relation Age of Onset   Asthma Sister    Heart disease Sister    Heart disease Brother     Social History   Socioeconomic History   Marital status: Single    Spouse name: Not on file   Number of children: 2   Years of education: Not on file   Highest education level: Not on file   Occupational History   Occupation: Health visitor    Employer:  CONTAINER  Tobacco Use   Smoking status: Some Days    Packs/day: 0.20    Years: 30.00    Additional pack years: 0.00    Total pack years: 6.00    Types: Cigarettes   Smokeless tobacco: Never  Vaping Use   Vaping Use: Never used  Substance and Sexual Activity   Alcohol use: No    Alcohol/week: 0.0 standard drinks of alcohol   Drug use: No   Sexual activity: Yes  Other Topics Concern   Not on file  Social History Narrative   Not on file   Social Determinants of Health   Financial Resource Strain: Not on file  Food Insecurity: Not on file  Transportation Needs: Not on file  Physical Activity: Not on file  Stress: Not on file  Social Connections: Not on file  Intimate Partner Violence: Not on file    Review of Systems: ROS negative except for what is noted on the assessment and plan.  There were no vitals filed for this visit.  Physical Exam: Constitutional: well-appearing *** sitting in ***, in no acute distress HENT: normocephalic atraumatic, mucous membranes moist Eyes: conjunctiva non-erythematous Cardiovascular: regular rate and rhythm, no m/r/g Pulmonary/Chest: normal work of breathing on  room air, lungs clear to auscultation bilaterally Abdominal: soft, non-tender, non-distended MSK: normal bulk and tone Neurological: alert & oriented x 3, no focal deficit Skin: warm and dry Psych: normal mood and behavior  Assessment & Plan:   HFU: Asthma/copd: treated w nebs + steroids. No abx bc procal <0.1  HTN: started on amlodipind 2.5 mg   Patient {GC/GE:[id number]::"discussed with","seen with"} Dr. [id number]. Hoffman","Mullen","Narendra","Vincent","Guilloud","Lau","Machen"}  No problem-specific Assessment & Plan notes found for this encounter.   Buddy Duty, D.O. [facility], PGY-2 Phone: [phone] Date 12/21/2022 Time 7:52 AM

## 2022-12-21 NOTE — TOC Progression Note (Signed)
Transition of Care [facility]) - Initial/Assessment Note    Patient Details  Name: Colton Ford MRN: [id number] Date of Birth: 1961-06-06  Transition of Care [facility]) CM/SW Contact:    Milinda Antis, LCSWA Phone Number: 12/21/2022, 11:56 AM  Clinical Narrative:                 Transition of Care Department [facility]) has reviewed patient.  Patient is from home and currently intubated. We will continue to monitor patient advancement through interdisciplinary progression rounds.     Patient Goals and CMS Choice            Expected Discharge Plan and Services                                              Prior Living Arrangements/Services                       Activities of Daily Living      Permission Sought/Granted                  Emotional Assessment              Admission diagnosis:  COPD exacerbation ([place]) [J44.1] Acute respiratory failure with hypoxia ([place]) [J96.01] Patient Active Problem List   Diagnosis Date Noted   Asthma exacerbation 12/07/2022   Abnormal finding on EKG/positive LVH criteria 12/07/2022   Microcytosis 12/07/2022   COPD with acute exacerbation ([place]) 11/03/2022   Tobacco abuse 11/03/2022   GERD without esophagitis 11/03/2022   Uncontrolled hypertension 11/03/2022   Hypoxia 11/03/2022   Community acquired pneumonia of left lung 11/03/2022   Sepsis due to pneumonia ([place]) 11/02/2022   Acute asthma exacerbation 08/08/2022   Poorly controlled severe persistent asthma without complication [id number]   Essential hypertension 04/12/2017   Asthma 01/15/2014   Tobacco use disorder 01/15/2014   COPD exacerbation ([place]) 04/10/2011   Inguinal hernia 03/20/2011   PCP:  Tanda Rockers, MD Pharmacy:   [id number] - [place], [place] [place] [address] Phone: [phone] Fax: [phone]  [facility] [address] [place] [place] [address] Phone:  [phone] Fax: [id number] [place], [place] [address] [place] [address] Phone: [phone] Fax: [phone]  [facility] [place], [address] [place] [address] [place] [address] Phone: [phone] Fax: [phone]     Social Determinants of Health (SDOH) Social History: SDOH Screenings   Tobacco Use: High Risk (12/21/2022)   SDOH Interventions:     Readmission Risk Interventions     No data to display

## 2022-12-21 NOTE — ED Triage Notes (Signed)
Patient from home. Family called EMS for shortness of breath. Initially patient was able to speak 1 word at a time, but now unable to speak. Hx of copd and asthma. Pt given 2g of magnesium, 0.6 of epi, 125mg  solu-medrol, 15mg  albuterol, and 1mg  atrovent.

## 2022-12-22 DIAGNOSIS — E440 Moderate protein-calorie malnutrition: Secondary | ICD-10-CM | POA: Insufficient documentation

## 2022-12-22 DIAGNOSIS — J9601 Acute respiratory failure with hypoxia: Secondary | ICD-10-CM

## 2022-12-22 DIAGNOSIS — F1410 Cocaine abuse, uncomplicated: Secondary | ICD-10-CM

## 2022-12-22 DIAGNOSIS — N179 Acute kidney failure, unspecified: Secondary | ICD-10-CM

## 2022-12-22 DIAGNOSIS — J449 Chronic obstructive pulmonary disease, unspecified: Secondary | ICD-10-CM

## 2022-12-22 LAB — CBC
HCT: 37.8 % — ABNORMAL LOW (ref 39.0–52.0)
Hemoglobin: 11.6 g/dL — ABNORMAL LOW (ref 13.0–17.0)
MCH: 21.2 pg — ABNORMAL LOW (ref 26.0–34.0)
MCHC: 30.7 g/dL (ref 30.0–36.0)
MCV: 69 fL — ABNORMAL LOW (ref 80.0–100.0)
Platelets: 319 10*3/uL (ref 150–400)
RBC: 5.48 MIL/uL (ref 4.22–5.81)
RDW: 18.6 % — ABNORMAL HIGH (ref 11.5–15.5)
WBC: 10.8 10*3/uL — ABNORMAL HIGH (ref 4.0–10.5)
nRBC: 0 % (ref 0.0–0.2)

## 2022-12-22 LAB — POCT I-STAT 7, (LYTES, BLD GAS, ICA,H+H)
Acid-Base Excess: 1 mmol/L (ref 0.0–2.0)
Bicarbonate: 29.4 mmol/L — ABNORMAL HIGH (ref 20.0–28.0)
Calcium, Ion: 1.26 mmol/L (ref 1.15–1.40)
HCT: 40 % (ref 39.0–52.0)
Hemoglobin: 13.6 g/dL (ref 13.0–17.0)
O2 Saturation: 97 %
Patient temperature: 96.4
Potassium: 5.4 mmol/L — ABNORMAL HIGH (ref 3.5–5.1)
Sodium: 131 mmol/L — ABNORMAL LOW (ref 135–145)
TCO2: 31 mmol/L (ref 22–32)
pCO2 arterial: 58.1 mmHg — ABNORMAL HIGH (ref 32–48)
pH, Arterial: 7.306 — ABNORMAL LOW (ref 7.35–7.45)
pO2, Arterial: 93 mmHg (ref 83–108)

## 2022-12-22 LAB — COMPREHENSIVE METABOLIC PANEL
ALT: 40 U/L (ref 0–44)
AST: 26 U/L (ref 15–41)
Albumin: 3.1 g/dL — ABNORMAL LOW (ref 3.5–5.0)
Alkaline Phosphatase: 54 U/L (ref 38–126)
Anion gap: 8 (ref 5–15)
BUN: 28 mg/dL — ABNORMAL HIGH (ref 8–23)
CO2: 27 mmol/L (ref 22–32)
Calcium: 8.4 mg/dL — ABNORMAL LOW (ref 8.9–10.3)
Chloride: 98 mmol/L (ref 98–111)
Creatinine, Ser: 1.68 mg/dL — ABNORMAL HIGH (ref 0.61–1.24)
GFR, Estimated: 45 mL/min — ABNORMAL LOW (ref >60)
Glucose, Bld: 114 mg/dL — ABNORMAL HIGH (ref 70–99)
Potassium: 5.8 mmol/L — ABNORMAL HIGH (ref 3.5–5.1)
Sodium: 133 mmol/L — ABNORMAL LOW (ref 135–145)
Total Bilirubin: 0.3 mg/dL (ref 0.3–1.2)
Total Protein: 6.2 g/dL — ABNORMAL LOW (ref 6.5–8.1)

## 2022-12-22 LAB — GLUCOSE, CAPILLARY
Glucose-Capillary: 96 mg/dL (ref 70–99)
Glucose-Capillary: 112 mg/dL — ABNORMAL HIGH (ref 70–99)
Glucose-Capillary: 121 mg/dL — ABNORMAL HIGH (ref 70–99)
Glucose-Capillary: 146 mg/dL — ABNORMAL HIGH (ref 70–99)
Glucose-Capillary: 92 mg/dL (ref 70–99)
Glucose-Capillary: 117 mg/dL — ABNORMAL HIGH (ref 70–99)

## 2022-12-22 LAB — BASIC METABOLIC PANEL
Anion gap: 10 (ref 5–15)
BUN: 30 mg/dL — ABNORMAL HIGH (ref 8–23)
CO2: 27 mmol/L (ref 22–32)
Calcium: 8.7 mg/dL — ABNORMAL LOW (ref 8.9–10.3)
Chloride: 97 mmol/L — ABNORMAL LOW (ref 98–111)
Creatinine, Ser: 1.41 mg/dL — ABNORMAL HIGH (ref 0.61–1.24)
GFR, Estimated: 56 mL/min — ABNORMAL LOW (ref >60)
Glucose, Bld: 120 mg/dL — ABNORMAL HIGH (ref 70–99)
Potassium: 5.1 mmol/L (ref 3.5–5.1)
Sodium: 134 mmol/L — ABNORMAL LOW (ref 135–145)

## 2022-12-22 LAB — PHOSPHORUS: Phosphorus: 2 mg/dL — ABNORMAL LOW (ref 2.5–4.6)

## 2022-12-22 LAB — MAGNESIUM: Magnesium: 1.4 mg/dL — ABNORMAL LOW (ref 1.7–2.4)

## 2022-12-22 LAB — TRIGLYCERIDES: Triglycerides: 75 mg/dL (ref <150)

## 2022-12-22 MED ORDER — SODIUM ZIRCONIUM CYCLOSILICATE 10 G PO PACK
10.0000 g | PACK | Freq: Once | ORAL | Status: AC
  Administered 2022-12-22: 10 g

## 2022-12-22 MED ORDER — IPRATROPIUM-ALBUTEROL 0.5-2.5 (3) MG/3ML IN SOLN
3.0000 mL | RESPIRATORY_TRACT | Status: DC | PRN
  Administered 2022-12-22 – 2022-12-24 (×5): 3 mL via RESPIRATORY_TRACT
  Filled 2022-12-22 (×6): qty 3

## 2022-12-22 MED ORDER — MAGNESIUM SULFATE 2 GM/50ML IV SOLN
2.0000 g | Freq: Once | INTRAVENOUS | Status: AC
  Administered 2022-12-22: 2 g via INTRAVENOUS
  Filled 2022-12-22: qty 50

## 2022-12-22 MED ORDER — PROSOURCE TF20 ENFIT COMPATIBL EN LIQD
60.0000 mL | Freq: Every day | ENTERAL | Status: DC
  Administered 2022-12-22 – 2022-12-23 (×2): 60 mL
  Filled 2022-12-22 (×2): qty 60

## 2022-12-22 MED ORDER — FAMOTIDINE 20 MG PO TABS
20.0000 mg | ORAL_TABLET | Freq: Every day | ORAL | Status: DC
  Administered 2022-12-22 – 2022-12-26 (×5): 20 mg
  Filled 2022-12-22 (×5): qty 1

## 2022-12-22 MED ORDER — OSMOLITE 1.5 CAL PO LIQD
1000.0000 mL | ORAL | Status: DC
  Administered 2022-12-22: 1000 mL

## 2022-12-22 MED ORDER — LACTATED RINGERS IV BOLUS
1000.0000 mL | Freq: Once | INTRAVENOUS | Status: AC
  Administered 2022-12-22: 1000 mL via INTRAVENOUS

## 2022-12-22 MED ORDER — OSMOLITE 1.5 CAL PO LIQD: 1000.0000 mL | ORAL | Status: DC

## 2022-12-22 NOTE — Progress Notes (Signed)
Initial Nutrition Assessment  DOCUMENTATION CODES:   Non-severe (moderate) malnutrition in context of chronic illness  INTERVENTION:   Initiate tube feeds via OG tube: - Start Osmolite 1.5 @ 25 ml/hr and advance by 10 ml q 4 hours to goal rate of 55 ml/hr (1320 ml/day) - PROSource TF20 60 ml daily  Tube feeding regimen at goal rate provides 2060 kcal, 103 grams of protein, and 1006 ml of H2O.  Monitor magnesium, potassium, and phosphorus q 12 hours for at least 6 occurrences, MD to replete as needed, as pt is at risk for refeeding syndrome given malnutrition.  NUTRITION DIAGNOSIS:   Severe Malnutrition related to chronic illness (COPD) as evidenced by severe fat depletion, severe muscle depletion.  GOAL:   Patient will meet greater than or equal to 90% of their needs  MONITOR:   Vent status, Labs, Weight trends, TF tolerance  REASON FOR ASSESSMENT:   Ventilator, Consult Enteral/tube feeding initiation and management  ASSESSMENT:   62 year old male who presented to the ED on 3/21 with SOB. PMH of childhood and adult asthma, tobacco abuse, COPD, HTN, anemia. Pt required intubation. Pt admitted with acute COPD/asthma exacerbation.  Discussed pt with RN. Consult received for enteral nutrition initiation and management. Pt with OG tube and side port in stomach per abdominal x-ray yesterday.  Met with pt at bedside. Pt interacted with RD by nodding/shaking head. Pt reports good appetite PTA. Pt endorses weight loss despite good appetite and PO intake. Reviewed weight history in chart. Pt with a 7.7 kg weight loss since 11/02/22. This is a 10.9% weight loss in less than 2 months which is severe and significant for timeframe. Pt meets criteria for severe malnutrition based on NFPE and weight loss.  Patient is currently intubated on ventilator support Temp (24hrs), Avg:97.2 F (36.2 C), Min:96.3 F (35.7 C), Max:98.2 F (36.8 C)  Drips: Propofol: 20 mcg/kg/min (provides 198 kcal  from lipid daily) Fentanyl  Medications reviewed and include: pepcid, SSI q 4 hours, IV solu-medrol, miralax, IV abx  Labs reviewed: sodium 131, potassium 5.4, BUN 28, creatinine 1.68, lactic acid 2.8, WBC 10.8 CBG's: 96-174 x 24 hours  UOP: 1035 ml x 24 hours I/O's: +812 ml since admit  NUTRITION - FOCUSED PHYSICAL EXAM:  Flowsheet Row Most Recent Value  Orbital Region Severe depletion  Upper Arm Region Severe depletion  Thoracic and Lumbar Region Severe depletion  Buccal Region Unable to assess  Temple Region Severe depletion  Clavicle Bone Region Severe depletion  Clavicle and Acromion Bone Region Severe depletion  Scapular Bone Region Moderate depletion  Dorsal Hand Unable to assess  Patellar Region Moderate depletion  Anterior Thigh Region Moderate depletion  Posterior Calf Region Moderate depletion  Edema (RD Assessment) None  Hair Reviewed  Eyes Reviewed  Mouth Reviewed  Skin Reviewed  Nails Reviewed       Diet Order:   Diet Order             Diet NPO time specified  Diet effective now                   EDUCATION NEEDS:   Not appropriate for education at this time  Skin:  Skin Assessment: Reviewed RN Assessment  Last BM:  no documented BM  Height:   Ht Readings from Last 1 Encounters:  12/21/22 6' (1.829 m)    Weight:   Wt Readings from Last 1 Encounters:  12/22/22 62.6 kg    Ideal Body Weight:  80.9 kg  BMI:  Body mass index is 18.72 kg/m.  Estimated Nutritional Needs:   Kcal:  1900-2100  Protein:  95-110 grams  Fluid:  >1.8 L    Gustavus Bryant, MS, RD, LDN Inpatient Clinical Dietitian Please see AMiON for contact information.

## 2022-12-22 NOTE — Progress Notes (Signed)
eLink Physician-Brief Progress Note Patient Name: Colton Ford DOB: 03-19-1961 MRN: [id number]   Date of Service  12/22/2022  HPI/Events of Note  62 year old male presented with acute exacerbation of asthma with hypercapnic respiratory failure requiring mechanical ventilation.  He has had minimal urine output, acidosis is improved, mild hypotension with SBP less than 100 consistently.  Currently not on feeds or fluids.  eICU Interventions  Add 1 L LR bolus.     Intervention Category Intermediate Interventions: Hypotension - evaluation and management  Anastashia Westerfeld 12/22/2022, 1:23 AM

## 2022-12-22 NOTE — Progress Notes (Signed)
NAME:  Colton Ford, MRN:  [id number], DOB:  1961-01-08, LOS: 1 ADMISSION DATE:  12/21/2022, CONSULTATION DATE:  12/24/2022 REFERRING MD:  Dr. Wyvonnia Dusky, CHIEF COMPLAINT:  SOB, intubated    History of Present Illness:  Colton Ford is a 62 y.o. male with a past medical history significant for COPD/asthma, tobacco abuse, hypertension, and anemia who presented to the emergency room 3/21 for complaints of shortness of breath.  Per EMS SpO2 mid 80s on room air with inability to speak in full sentences, patient received bronchodilators, Solu-Medrol, CM and IM epi prior to arrival.  Of note patient has been treated at this medical facility multiple times for acute COPD exasperation's requiring BiPAP and/or intubation.  On arrival to ED patient seen diaphoretic, tachypneic, tachycardic, and hypertensive on BiPAP.  All lab work pending at time of assessment.  Given acute respiratory distress patient was intubated by EDP.  Postintubation chest x-ray with possible trace left pleural effusion.  Critical care consulted for further management and admission.  Pertinent  Medical History  COPD/asthma Tobacco abuse Hypertension Anemia  Significant Hospital Events: Including procedures, antibiotic start and stop dates in addition to other pertinent events   3/21 presented in acute respiratory distress resulting in immediate intubation upon ED arrival 3/22 staying intubated while we work on lung tightness. AKI worse   Interim History / Subjective:  Got 1L IVF for AKI   Objective   Blood pressure 123/76, pulse 72, temperature (!) 96.3 F (35.7 C), temperature source Axillary, resp. rate 16, height 6' (1.829 m), weight 62.6 kg, SpO2 97 %.    Vent Mode: PRVC FiO2 (%):  [40 %] 40 % Set Rate:  [12 bmp-20 bmp] 15 bmp Vt Set:  [620 mL] 620 mL PEEP:  [5 cmH20-8 cmH20] 5 cmH20 Plateau Pressure:  [14 cmH20-24 cmH20] 24 cmH20   Intake/Output Summary (Last 24 hours) at 12/22/2022 1046 Last data filed at  12/22/2022 0900 Gross per 24 hour  Intake 2012.51 ml  Output 850 ml  Net 1162.51 ml   Filed Weights   12/21/22 0948 12/22/22 0400  Weight: 62.6 kg 62.6 kg    Examination: General: Critically ill appearing adult M intubated lightly sedated NAD  HEENT: NCAT pink mm ETT secure  Neuro: Awakens to voice following commands CV: rrr cap refill brisk  PULM:  Diffuse wheeze, mechanically ventilated  GI: soft ndnt  Extremities: no acute joint deformity Skin:c/d/w  Resolved Hospital Problem list     Assessment & Plan:   Acute hypoxic and hypercarbic respiratory failure COPD/Asthma w exacerbation  Tobacco use Cocaine use  P: -will give 2g mag -cont steroids  -yupelri, brovana, pulmicort, duoneb  -azithro -cessation support  -VAP, pulm hygiene -PRN CXR, ABG  -tolerating RASS 0 to -1 well  -WUA/SBT when lungs are less tight   AKI  Hyperkalemia  P: -bladder scan, foley troubleshooting -will likely need add'l IVF support  Lactic acidosis  -follow PRN   HTN -holding home meds, sedation is keeping him well managed for now   Inadequate PO intake -EN per RDN    Best Practice (right click and "Reselect all SmartList Selections" daily)   Diet/type: NPO DVT prophylaxis: prophylactic heparin  GI prophylaxis: PPI Lines: N/A Foley:  Yes, and it is still needed Code Status:  full code Last date of multidisciplinary goals of care discussion: --   Labs   CBC: Recent Labs  Lab 12/21/22 0932 12/21/22 1049 12/21/22 1052 12/22/22 0726 12/22/22 0751  WBC  --   --  19.7* 10.8*  --   HGB 16.0 15.0 13.1 11.6* 13.6  HCT 47.0 44.0 42.4 37.8* 40.0  MCV  --   --  69.1* 69.0*  --   PLT  --   --  368 319  --     Basic Metabolic Panel: Recent Labs  Lab 12/21/22 0924 12/21/22 0932 12/21/22 1049 12/22/22 0726 12/22/22 0751  NA 134* 132* 131* 133* 131*  K 4.1 4.6 4.2 5.8* 5.4*  CL 99  --   --  98  --   CO2 27  --   --  27  --   GLUCOSE 237*  --   --  114*  --   BUN 8   --   --  28*  --   CREATININE 1.01  --   --  1.68*  --   CALCIUM 8.9  --   --  8.4*  --    GFR: Estimated Creatinine Clearance: 39.8 mL/min (A) (by C-G formula based on SCr of 1.68 mg/dL (H)). Recent Labs  Lab 12/21/22 0924 12/21/22 1052 12/21/22 1254 12/22/22 0726  PROCALCITON  --   --  0.37  --   WBC  --  19.7*  --  10.8*  LATICACIDVEN 2.1*  --  2.8*  --     Liver Function Tests: Recent Labs  Lab 12/21/22 0924 12/22/22 0726  AST 76* 26  ALT 62* 40  ALKPHOS 79 54  BILITOT 0.6 0.3  PROT 7.9 6.2*  ALBUMIN 4.0 3.1*   No results for input(s): "LIPASE", "AMYLASE" in the last 168 hours. No results for input(s): "AMMONIA" in the last 168 hours.  ABG    Component Value Date/Time   PHART 7.306 (L) 12/22/2022 0751   PCO2ART 58.1 (H) 12/22/2022 0751   PO2ART 93 12/22/2022 0751   HCO3 29.4 (H) 12/22/2022 0751   TCO2 31 12/22/2022 0751   ACIDBASEDEF 1.0 12/21/2022 0932   O2SAT 97 12/22/2022 0751     Coagulation Profile: No results for input(s): "INR", "PROTIME" in the last 168 hours.  Cardiac Enzymes: No results for input(s): "CKTOTAL", "CKMB", "CKMBINDEX", "TROPONINI" in the last 168 hours.  HbA1C: Hgb A1c MFr Bld  Date/Time Value Ref Range Status  12/07/2022 07:40 AM 6.2 (H) 4.8 - 5.6 % Final    Comment:    (NOTE)         Prediabetes: 5.7 - 6.4         Diabetes: >6.4         Glycemic control for adults with diabetes: <7.0   08/08/2022 02:40 AM 5.9 (H) 4.8 - 5.6 % Final    Comment:    (NOTE) Pre diabetes:          5.7%-6.4%  Diabetes:              >6.4%  Glycemic control for   <7.0% adults with diabetes     CBG: Recent Labs  Lab 12/21/22 1522 12/21/22 1922 12/21/22 2322 12/22/22 0323 12/22/22 0747  GLUCAP 130* 124* 96 96 112*    CRITICAL CARE Performed by: Cristal Generous   Total critical care time: 38 minutes  Critical care time was exclusive of separately billable procedures and treating other patients. Critical care was necessary to  treat or prevent imminent or life-threatening deterioration.  Critical care was time spent personally by me on the following activities: development of treatment plan with patient and/or surrogate as well as nursing, discussions with consultants, evaluation of patient's response to treatment, examination  of patient, obtaining history from patient or surrogate, ordering and performing treatments and interventions, ordering and review of laboratory studies, ordering and review of radiographic studies, pulse oximetry and re-evaluation of patient's condition.  Eliseo Gum MSN, AGACNP-BC [facility] [id number] If no answer, [id number] 12/22/2022, 10:46 AM

## 2022-12-23 ENCOUNTER — Inpatient Hospital Stay (HOSPITAL_COMMUNITY): Payer: Medicaid Other

## 2022-12-23 LAB — BASIC METABOLIC PANEL
Anion gap: 8 (ref 5–15)
BUN: 34 mg/dL — ABNORMAL HIGH (ref 8–23)
CO2: 27 mmol/L (ref 22–32)
Calcium: 8.6 mg/dL — ABNORMAL LOW (ref 8.9–10.3)
Chloride: 100 mmol/L (ref 98–111)
Creatinine, Ser: 1.15 mg/dL (ref 0.61–1.24)
GFR, Estimated: >60 mL/min (ref >60)
Glucose, Bld: 74 mg/dL (ref 70–99)
Potassium: 5.1 mmol/L (ref 3.5–5.1)
Sodium: 135 mmol/L (ref 135–145)

## 2022-12-23 LAB — CBC
HCT: 36.9 % — ABNORMAL LOW (ref 39.0–52.0)
Hemoglobin: 11.9 g/dL — ABNORMAL LOW (ref 13.0–17.0)
MCH: 21.9 pg — ABNORMAL LOW (ref 26.0–34.0)
MCHC: 32.2 g/dL (ref 30.0–36.0)
MCV: 67.8 fL — ABNORMAL LOW (ref 80.0–100.0)
Platelets: 325 10*3/uL (ref 150–400)
RBC: 5.44 MIL/uL (ref 4.22–5.81)
RDW: 18 % — ABNORMAL HIGH (ref 11.5–15.5)
WBC: 16.9 10*3/uL — ABNORMAL HIGH (ref 4.0–10.5)
nRBC: 0 % (ref 0.0–0.2)

## 2022-12-23 LAB — RESPIRATORY PANEL BY PCR

## 2022-12-23 LAB — PHOSPHORUS: Phosphorus: 5 mg/dL — ABNORMAL HIGH (ref 2.5–4.6)

## 2022-12-23 LAB — GLUCOSE, CAPILLARY
Glucose-Capillary: 122 mg/dL — ABNORMAL HIGH (ref 70–99)
Glucose-Capillary: 108 mg/dL — ABNORMAL HIGH (ref 70–99)
Glucose-Capillary: 126 mg/dL — ABNORMAL HIGH (ref 70–99)
Glucose-Capillary: 125 mg/dL — ABNORMAL HIGH (ref 70–99)
Glucose-Capillary: 99 mg/dL (ref 70–99)
Glucose-Capillary: 199 mg/dL — ABNORMAL HIGH (ref 70–99)

## 2022-12-23 LAB — MAGNESIUM: Magnesium: 3.1 mg/dL — ABNORMAL HIGH (ref 1.7–2.4)

## 2022-12-23 MED ORDER — DEXMEDETOMIDINE HCL IN NACL 400 MCG/100ML IV SOLN
0.0000 ug/kg/h | INTRAVENOUS | Status: DC
  Administered 2022-12-23: 0.4 ug/kg/h via INTRAVENOUS
  Administered 2022-12-23: 0.8 ug/kg/h via INTRAVENOUS
  Administered 2022-12-24: 1.2 ug/kg/h via INTRAVENOUS
  Filled 2022-12-23 (×2): qty 100

## 2022-12-23 MED ORDER — DEXMEDETOMIDINE HCL IN NACL 400 MCG/100ML IV SOLN
INTRAVENOUS | Status: AC
  Filled 2022-12-23: qty 100

## 2022-12-23 MED ORDER — ORAL CARE MOUTH RINSE: 15.0000 mL | OROMUCOSAL | Status: DC | PRN

## 2022-12-23 NOTE — Progress Notes (Signed)
NAME:  Colton Ford, MRN:  [id number], DOB:  1961/04/30, LOS: 2 ADMISSION DATE:  12/21/2022, CONSULTATION DATE:  12/24/2022 REFERRING MD:  Dr. Wyvonnia Dusky, CHIEF COMPLAINT:  SOB, intubated    History of Present Illness:  Colton Ford is a 62 y.o. male with a past medical history significant for COPD/asthma, tobacco abuse, hypertension, and anemia who presented to the emergency room 3/21 for complaints of shortness of breath.  Per EMS SpO2 mid 80s on room air with inability to speak in full sentences, patient received bronchodilators, Solu-Medrol, CM and IM epi prior to arrival.  Of note patient has been treated at this medical facility multiple times for acute COPD exasperation's requiring BiPAP and/or intubation.  On arrival to ED patient seen diaphoretic, tachypneic, tachycardic, and hypertensive on BiPAP.  All lab work pending at time of assessment.  Given acute respiratory distress patient was intubated by EDP.  Postintubation chest x-ray with possible trace left pleural effusion.  Critical care consulted for further management and admission.  Pertinent  Medical History  COPD/asthma Tobacco abuse Hypertension Anemia  Significant Hospital Events: Including procedures, antibiotic start and stop dates in addition to other pertinent events   3/21 presented in acute respiratory distress resulting in immediate intubation upon ED arrival 3/22 staying intubated while we work on lung tightness. AKI worse   Interim History / Subjective:  Morning labs pending. On 35 propofol, 100 fentanyl. Awaken, writing on board. On minimal vent settings. Tachypnea with PS   Objective   Blood pressure 135/66, pulse 63, temperature (!) 97.5 F (36.4 C), temperature source Axillary, resp. rate 14, height 6' (1.829 m), weight 68.9 kg, SpO2 100 %.    Vent Mode: PRVC FiO2 (%):  [40 %] 40 % Set Rate:  [15 bmp] 15 bmp Vt Set:  [620 mL] 620 mL PEEP:  [5 cmH20] 5 cmH20 Plateau Pressure:  [16 cmH20-21 cmH20] 20  cmH20   Intake/Output Summary (Last 24 hours) at 12/23/2022 0948 Last data filed at 12/23/2022 0900 Gross per 24 hour  Intake 2582.71 ml  Output 1825 ml  Net 757.71 ml   Filed Weights   12/21/22 0948 12/22/22 0400 12/23/22 0421  Weight: 62.6 kg 62.6 kg 68.9 kg    Examination: General: Adult male on vent  HEENT: ETT/OG in place  Neuro: Awakens with verbal stimulation, following commands, writing on board  CV: RRR, no MRG  PULM:  good air movement, no wheeze, moderate accessory muscle use  GI: soft, non-tender, active bowel sounds  Extremities: -edema  Skin: intact   Resolved Hospital Problem list     Assessment & Plan:   Acute hypoxic and hypercarbic respiratory failure in setting of AECOPD vs Asthma Excerebration  Tobacco, THC, Cocaine use  - COVID, Flu negative  P: -Continue Vent Support -PS as tolerated  -Continue Steroids  -Continue yupelri, brovana, pulmicort, duoneb  -VAP, pulm hygiene -cessation support  -CXR pending  Sedation Plan:  -Titrate Propofol, Fentanyl for RASS goal 0/-1  Leucocytosis > improving, reactive vs steroid use  Thick Yellow secretions  MRSA PCR +  P:  -Send Tracheal Asp, RVP  -Continue Azithromycin   Acute Renal Injury  Hyperkalemia  Hypomagnesemia  Lactic acidosis in setting of albuterol vs hypoperfusion  P: -Trend BMP >> morning labs pending -Replace electrolytes as indicated   HTN -holding home meds, sedation is keeping him well managed for now   Inadequate PO intake -EN per RDN    Best Practice (right click and "Reselect all SmartList Selections"  daily)   Diet/type: tubefeeds DVT prophylaxis: prophylactic heparin  GI prophylaxis: H2B Lines: N/A Foley:  Yes, and it is still needed Code Status:  full code Last date of multidisciplinary goals of care discussion: spoke with patient regarding plan, no family present   Labs   CBC: Recent Labs  Lab 12/21/22 0932 12/21/22 1049 12/21/22 1052 12/22/22 0726  12/22/22 0751  WBC  --   --  19.7* 10.8*  --   HGB 16.0 15.0 13.1 11.6* 13.6  HCT 47.0 44.0 42.4 37.8* 40.0  MCV  --   --  69.1* 69.0*  --   PLT  --   --  368 319  --     Basic Metabolic Panel: Recent Labs  Lab 12/21/22 0924 12/21/22 0932 12/21/22 1049 12/22/22 0726 12/22/22 0751 12/22/22 1205 12/22/22 1632 12/23/22 0542  NA 134* 132* 131* 133* 131* 134*  --   --   K 4.1 4.6 4.2 5.8* 5.4* 5.1  --   --   CL 99  --   --  98  --  97*  --   --   CO2 27  --   --  27  --  27  --   --   GLUCOSE 237*  --   --  114*  --  120*  --   --   BUN 8  --   --  28*  --  30*  --   --   CREATININE 1.01  --   --  1.68*  --  1.41*  --   --   CALCIUM 8.9  --   --  8.4*  --  8.7*  --   --   MG  --   --   --   --   --   --  1.4* 3.1*  PHOS  --   --   --   --   --   --  2.0* 5.0*   GFR: Estimated Creatinine Clearance: 52.3 mL/min (A) (by C-G formula based on SCr of 1.41 mg/dL (H)). Recent Labs  Lab 12/21/22 0924 12/21/22 1052 12/21/22 1254 12/22/22 0726  PROCALCITON  --   --  0.37  --   WBC  --  19.7*  --  10.8*  LATICACIDVEN 2.1*  --  2.8*  --     Liver Function Tests: Recent Labs  Lab 12/21/22 0924 12/22/22 0726  AST 76* 26  ALT 62* 40  ALKPHOS 79 54  BILITOT 0.6 0.3  PROT 7.9 6.2*  ALBUMIN 4.0 3.1*   No results for input(s): "LIPASE", "AMYLASE" in the last 168 hours. No results for input(s): "AMMONIA" in the last 168 hours.  ABG    Component Value Date/Time   PHART 7.306 (L) 12/22/2022 0751   PCO2ART 58.1 (H) 12/22/2022 0751   PO2ART 93 12/22/2022 0751   HCO3 29.4 (H) 12/22/2022 0751   TCO2 31 12/22/2022 0751   ACIDBASEDEF 1.0 12/21/2022 0932   O2SAT 97 12/22/2022 0751     Coagulation Profile: No results for input(s): "INR", "PROTIME" in the last 168 hours.  Cardiac Enzymes: No results for input(s): "CKTOTAL", "CKMB", "CKMBINDEX", "TROPONINI" in the last 168 hours.  HbA1C: Hgb A1c MFr Bld  Date/Time Value Ref Range Status  12/07/2022 07:40 AM 6.2 (H) 4.8 - 5.6  % Final    Comment:    (NOTE)         Prediabetes: 5.7 - 6.4         Diabetes: >6.4  Glycemic control for adults with diabetes: <7.0   08/08/2022 02:40 AM 5.9 (H) 4.8 - 5.6 % Final    Comment:    (NOTE) Pre diabetes:          5.7%-6.4%  Diabetes:              >6.4%  Glycemic control for   <7.0% adults with diabetes     CBG: Recent Labs  Lab 12/22/22 1135 12/22/22 1538 12/22/22 1932 12/22/22 2314 12/23/22 0319  GLUCAP 121* 146* 92 117* 122*    CRITICAL CARE Performed by: Omar Person   Total critical care time: 35 minutes  Critical care time was exclusive of separately billable procedures and treating other patients. Critical care was necessary to treat or prevent imminent or life-threatening deterioration.  Critical care was time spent personally by me on the following activities: development of treatment plan with patient and/or surrogate as well as nursing, discussions with consultants, evaluation of patient's response to treatment, examination of patient, obtaining history from patient or surrogate, ordering and performing treatments and interventions, ordering and review of laboratory studies, ordering and review of radiographic studies, pulse oximetry and re-evaluation of patient's condition.  Hayden Pedro, AGACNP-BC [facility]  PCCM Pgr: [phone]

## 2022-12-23 NOTE — Progress Notes (Signed)
Patient started on Precedex gtt for anxiety, alert, follows commands while on vent. Underwent PS 5/5 for 45 minutes, breaths sounds clear. Extubation orders placed. Will extubate to BiPAP for one hour, if remains stable from a respiratory stand point can then change to nasal cannula.   D/C Tube Feeds. Keep H2B for now given NPO and high steroid use. Okay to continued Precedex for agitation.

## 2022-12-24 LAB — GLUCOSE, CAPILLARY
Glucose-Capillary: 124 mg/dL — ABNORMAL HIGH (ref 70–99)
Glucose-Capillary: 159 mg/dL — ABNORMAL HIGH (ref 70–99)
Glucose-Capillary: 190 mg/dL — ABNORMAL HIGH (ref 70–99)
Glucose-Capillary: 98 mg/dL (ref 70–99)
Glucose-Capillary: 157 mg/dL — ABNORMAL HIGH (ref 70–99)

## 2022-12-24 LAB — BASIC METABOLIC PANEL
Anion gap: 11 (ref 5–15)
BUN: 22 mg/dL (ref 8–23)
CO2: 27 mmol/L (ref 22–32)
Calcium: 8.8 mg/dL — ABNORMAL LOW (ref 8.9–10.3)
Chloride: 97 mmol/L — ABNORMAL LOW (ref 98–111)
Creatinine, Ser: 0.92 mg/dL (ref 0.61–1.24)
GFR, Estimated: >60 mL/min (ref >60)
Glucose, Bld: 101 mg/dL — ABNORMAL HIGH (ref 70–99)
Potassium: 5 mmol/L (ref 3.5–5.1)
Sodium: 135 mmol/L (ref 135–145)

## 2022-12-24 LAB — CBC
HCT: 41.2 % (ref 39.0–52.0)
Hemoglobin: 13 g/dL (ref 13.0–17.0)
MCH: 21.5 pg — ABNORMAL LOW (ref 26.0–34.0)
MCHC: 31.6 g/dL (ref 30.0–36.0)
MCV: 68 fL — ABNORMAL LOW (ref 80.0–100.0)
Platelets: 335 10*3/uL (ref 150–400)
RBC: 6.06 MIL/uL — ABNORMAL HIGH (ref 4.22–5.81)
RDW: 18.7 % — ABNORMAL HIGH (ref 11.5–15.5)
WBC: 17.8 10*3/uL — ABNORMAL HIGH (ref 4.0–10.5)
nRBC: 0.1 % (ref 0.0–0.2)

## 2022-12-24 LAB — PHOSPHORUS: Phosphorus: 3.7 mg/dL (ref 2.5–4.6)

## 2022-12-24 LAB — MAGNESIUM: Magnesium: 2.5 mg/dL — ABNORMAL HIGH (ref 1.7–2.4)

## 2022-12-24 MED ORDER — AMLODIPINE BESYLATE 10 MG PO TABS
10.0000 mg | ORAL_TABLET | Freq: Every day | ORAL | Status: DC
  Administered 2022-12-25 – 2022-12-26 (×2): 10 mg via ORAL
  Filled 2022-12-24 (×2): qty 1

## 2022-12-24 MED ORDER — AMLODIPINE BESYLATE 5 MG PO TABS
2.5000 mg | ORAL_TABLET | Freq: Every day | ORAL | Status: DC
  Administered 2022-12-24: 2.5 mg via ORAL
  Filled 2022-12-24: qty 1

## 2022-12-24 MED ORDER — CALCIUM CARBONATE ANTACID 500 MG PO CHEW
1.0000 | CHEWABLE_TABLET | ORAL | Status: DC | PRN
  Filled 2022-12-24: qty 1

## 2022-12-24 MED ORDER — AMLODIPINE BESYLATE 5 MG PO TABS
7.5000 mg | ORAL_TABLET | Freq: Once | ORAL | Status: AC
  Administered 2022-12-24: 7.5 mg via ORAL
  Filled 2022-12-24: qty 2

## 2022-12-24 MED ORDER — MELATONIN 3 MG PO TABS: 3.0000 mg | ORAL_TABLET | Freq: Every evening | ORAL | Status: DC | PRN

## 2022-12-24 MED ORDER — INSULIN ASPART 100 UNIT/ML IJ SOLN
0.0000 [IU] | Freq: Three times a day (TID) | INTRAMUSCULAR | Status: DC
  Administered 2022-12-25 (×3): 2 [IU] via SUBCUTANEOUS
  Administered 2022-12-25: 5 [IU] via SUBCUTANEOUS
  Administered 2022-12-26: 3 [IU] via SUBCUTANEOUS

## 2022-12-24 MED ORDER — PANTOPRAZOLE SODIUM 40 MG PO TBEC
40.0000 mg | DELAYED_RELEASE_TABLET | Freq: Every day | ORAL | Status: DC
  Administered 2022-12-24 – 2022-12-26 (×3): 40 mg via ORAL
  Filled 2022-12-24 (×3): qty 1

## 2022-12-24 NOTE — Progress Notes (Signed)
Report given to 2C RN 

## 2022-12-24 NOTE — Progress Notes (Signed)
Pt refused bath; however, agreed to be wiped down w/ CHG towels

## 2022-12-24 NOTE — Progress Notes (Signed)
Pt has PRN Bipap orders. No distress noted at this time. Pt aware to have RN call RT if ne feels sob.

## 2022-12-24 NOTE — Progress Notes (Signed)
Attempted to call report to 2C. 

## 2022-12-24 NOTE — Progress Notes (Signed)
RN called into patient's room d/t patient spilling urinal in the bed. Patient found to be diaphoretic, tachypneic, and tachycardic with increased work of breathing. Titrated precedex to decrease anxiety, administered PRN breathing treatment per MD order (see MAR). Notified RT who placed pt on bipap. Patient cleaned up and vital signs returned to previous readings.

## 2022-12-24 NOTE — Progress Notes (Signed)
Placed pt. On bipap due to respiratory distress and desating.

## 2022-12-24 NOTE — Progress Notes (Addendum)
   NAME:  Colton Ford, MRN:  [id number], DOB:  [birth year], LOS: 3 ADMISSION DATE:  12/21/2022, CONSULTATION DATE:  12/24/2022 REFERRING MD:  Dr. Wyvonnia Dusky, CHIEF COMPLAINT:  SOB, intubated    History of Present Illness:  Colton SHOR is a 62 y.o. male with a past medical history significant for COPD/asthma, tobacco abuse, hypertension, and anemia who presented to the emergency room 3/21 for complaints of shortness of breath.  Per EMS SpO2 mid 80s on room air with inability to speak in full sentences, patient received bronchodilators, Solu-Medrol, CM and IM epi prior to arrival.  Of note patient has been treated at this medical facility multiple times for acute COPD exasperation's requiring BiPAP and/or intubation.  On arrival to ED patient seen diaphoretic, tachypneic, tachycardic, and hypertensive on BiPAP.  All lab work pending at time of assessment.  Given acute respiratory distress patient was intubated by EDP.  Postintubation chest x-ray with possible trace left pleural effusion.  Critical care consulted for further management and admission.  Pertinent  Medical History  COPD/asthma Tobacco abuse Hypertension Anemia  Significant Hospital Events: Including procedures, antibiotic start and stop dates in addition to other pertinent events   3/21 presented in acute respiratory distress resulting in immediate intubation upon ED arrival 3/22 staying intubated while we work on lung tightness. AKI worse   Interim History / Subjective:  Extubated yesterday, some increased WOB and stridor relieved with BIPAP + precedex.  Now off both, eating breakfast. Breathing improved.  Objective   Blood pressure 131/74, pulse 65, temperature 97.8 F (36.6 C), temperature source Oral, resp. rate (!) 22, height 6' (1.829 m), weight 67.2 kg, SpO2 100 %.    Vent Mode: BIPAP FiO2 (%):  [36 %-40 %] 36 % Set Rate:  [15 bmp] 15 bmp Vt Set:  [620 mL] 620 mL PEEP:  [5 cmH20] 5 cmH20 Plateau Pressure:  [24  cmH20] 24 cmH20   Intake/Output Summary (Last 24 hours) at 12/24/2022 0901 Last data filed at 12/24/2022 0800 Gross per 24 hour  Intake 993.46 ml  Output 2200 ml  Net -1206.54 ml    Filed Weights   12/22/22 0400 12/23/22 0421 12/24/22 0500  Weight: 62.6 kg 68.9 kg 67.2 kg    Examination: Thin man no distress Lungs with wheezing, mildly tachypneic Ext warm +clubbing Aox3 Eating breakfast  BMP looks okay CBC stable   Resolved Hospital Problem list   AKI  Assessment & Plan:   Acute hypoxic and hypercarbic respiratory failure in setting of AECOPD vs Asthma Excerebration  Rhinovirus infection Tobacco, THC, Cocaine use  Thick Yellow secretions  MRSA PCR +  - COVID, Flu negative  P: - LABA/LAMA/ICS nebs - IV steroids for another day - Azithromycin, f/u tracheal aspirate and treat if anything grows - BIPAP PRN  HTN - resume home amlodipine  Stable for transfer to floor Remaining issues - O2 wean - PO steroid transition - Appreciate TRH taking over starting 3/25, PCCM will check on tomorrow to outline steroid plan  Erskine Emery MD PCCM

## 2022-12-24 NOTE — Progress Notes (Signed)
Pt stable and vitals are w/in order set parameters for transfer. Awaiting to give 2C RN report

## 2022-12-24 NOTE — Progress Notes (Signed)
eLink Physician-Brief Progress Note Patient Name: AMI KUMAGAI DOB: [birth year] MRN: [id number]   Date of Service  12/24/2022  HPI/Events of Note  Pt with no hx of DM but is on IV steroids at this time.   Pt also requesting for sleep aid.  eICU Interventions  Change timing of insulin sliding scale to ACHS.  Melatonin ordered qhs PRN.      Intervention Category Intermediate Interventions: Other:  Elsie Lincoln 12/24/2022, 8:47 PM

## 2022-12-25 DIAGNOSIS — N179 Acute kidney failure, unspecified: Secondary | ICD-10-CM

## 2022-12-25 DIAGNOSIS — F1410 Cocaine abuse, uncomplicated: Secondary | ICD-10-CM

## 2022-12-25 DIAGNOSIS — J441 Chronic obstructive pulmonary disease with (acute) exacerbation: Principal | ICD-10-CM

## 2022-12-25 DIAGNOSIS — J9601 Acute respiratory failure with hypoxia: Secondary | ICD-10-CM

## 2022-12-25 LAB — BASIC METABOLIC PANEL
Anion gap: 9 (ref 5–15)
BUN: 23 mg/dL (ref 8–23)
CO2: 27 mmol/L (ref 22–32)
Calcium: 8.8 mg/dL — ABNORMAL LOW (ref 8.9–10.3)
Chloride: 96 mmol/L — ABNORMAL LOW (ref 98–111)
Creatinine, Ser: 0.91 mg/dL (ref 0.61–1.24)
GFR, Estimated: >60 mL/min (ref >60)
Glucose, Bld: 121 mg/dL — ABNORMAL HIGH (ref 70–99)
Potassium: 4.4 mmol/L (ref 3.5–5.1)
Sodium: 132 mmol/L — ABNORMAL LOW (ref 135–145)

## 2022-12-25 LAB — CBC
HCT: 39.8 % (ref 39.0–52.0)
Hemoglobin: 12.9 g/dL — ABNORMAL LOW (ref 13.0–17.0)
MCH: 21.3 pg — ABNORMAL LOW (ref 26.0–34.0)
MCHC: 32.4 g/dL (ref 30.0–36.0)
MCV: 65.6 fL — ABNORMAL LOW (ref 80.0–100.0)
Platelets: 348 10*3/uL (ref 150–400)
RBC: 6.07 MIL/uL — ABNORMAL HIGH (ref 4.22–5.81)
RDW: 17.5 % — ABNORMAL HIGH (ref 11.5–15.5)
WBC: 18.1 10*3/uL — ABNORMAL HIGH (ref 4.0–10.5)
nRBC: 0 % (ref 0.0–0.2)

## 2022-12-25 LAB — MAGNESIUM: Magnesium: 2.4 mg/dL (ref 1.7–2.4)

## 2022-12-25 LAB — CULTURE, RESPIRATORY W GRAM STAIN

## 2022-12-25 LAB — GLUCOSE, CAPILLARY
Glucose-Capillary: 137 mg/dL — ABNORMAL HIGH (ref 70–99)
Glucose-Capillary: 123 mg/dL — ABNORMAL HIGH (ref 70–99)
Glucose-Capillary: 217 mg/dL — ABNORMAL HIGH (ref 70–99)
Glucose-Capillary: 141 mg/dL — ABNORMAL HIGH (ref 70–99)

## 2022-12-25 LAB — PHOSPHORUS: Phosphorus: 2 mg/dL — ABNORMAL LOW (ref 2.5–4.6)

## 2022-12-25 MED ORDER — SODIUM PHOSPHATES 45 MMOLE/15ML IV SOLN
15.0000 mmol | Freq: Once | INTRAVENOUS | Status: AC
  Administered 2022-12-25: 15 mmol via INTRAVENOUS
  Filled 2022-12-25: qty 5

## 2022-12-25 MED ORDER — GUAIFENESIN ER 600 MG PO TB12
1200.0000 mg | ORAL_TABLET | Freq: Two times a day (BID) | ORAL | Status: DC
  Administered 2022-12-25 – 2022-12-26 (×3): 1200 mg via ORAL
  Filled 2022-12-25 (×3): qty 2

## 2022-12-25 MED ORDER — IPRATROPIUM-ALBUTEROL 0.5-2.5 (3) MG/3ML IN SOLN
3.0000 mL | Freq: Four times a day (QID) | RESPIRATORY_TRACT | Status: DC
  Administered 2022-12-25 – 2022-12-26 (×4): 3 mL via RESPIRATORY_TRACT
  Filled 2022-12-25 (×4): qty 3

## 2022-12-25 NOTE — Progress Notes (Addendum)
[facility]  PROGRESS NOTE  Colton Ford [id number] DOB: 1960-10-30 DOA: 12/21/2022 PCP: Tanda Rockers, MD   Brief HPI:   62 year old male with past medical history of COPD/asthma, tobacco abuse, hypertension, anemia presented to ED on 3/21 with shortness of breath.  Patient was found to be hypoxemic.  Received bronchodilators, Solu-Medrol.  Found to be in acute exacerbation of COPD required BiPAP and then later intubation.  Patient was extubated yesterday and transferred to medical floor.    Subjective   Patient seen and examined, breathing has significantly improved.  Currently not requiring oxygen.   Assessment/Plan:    Acute hypoxemic and hypercarbic respiratory failure -In setting of COPD versus asthma exacerbation, also had rhinovirus infection -History of THC, cocaine use, tobacco use -COVID-19 and friends are negative -Patient was intubated and now extubated -Patient is currently on Solu-Medrol 40 mg IV every 12 hours, Pulmicort nebulizer twice daily, arformoterol nebulization twice daily, revefenacin nebulization daily -Will continue with Solu-Medrol, start Mucinex 1200 mg p.o. twice daily. -Will discontinue Yupelri and arformoterol nebulizer -Will start DuoNeb nebulizers every 6 hours scheduled. -Continue azithromycin -Use BiPAP as needed  Hypertension -Continue amlodipine  Hyperglycemia -Secondary to steroids as above -Continue sliding scale insulin NovoLog  Leukocytosis -Likely in setting of steroids      Medications     amLODipine  10 mg Oral Daily   budesonide (PULMICORT) nebulizer solution  0.25 mg Nebulization BID   Chlorhexidine Gluconate Cloth  6 each Topical Daily   famotidine  20 mg Per Tube Daily   guaiFENesin  1,200 mg Oral BID   heparin  5,000 Units Subcutaneous Q8H   insulin aspart  0-15 Units Subcutaneous TID AC & HS   ipratropium-albuterol  3 mL Nebulization Q6H   methylPREDNISolone (SOLU-MEDROL) injection  40 mg  Intravenous Q12H   mupirocin ointment  1 Application Nasal BID   pantoprazole  40 mg Oral Daily   polyethylene glycol  17 g Per Tube Daily     Data Reviewed:   CBG:  Recent Labs  Lab 12/24/22 1109 12/24/22 1511 12/24/22 1950 12/25/22 0632 12/25/22 1115  GLUCAP 190* 98 157* 137* 123*    SpO2: 98 % O2 Flow Rate (L/min): 3 L/min FiO2 (%): 36 %    Vitals:   12/25/22 0734 12/25/22 0740 12/25/22 0800 12/25/22 1100  BP:  (!) 178/98 (!) 145/85 134/82  Pulse:  70 83 80  Resp:  19 20 20   Temp:  98.2 F (36.8 C)  98 F (36.7 C)  TempSrc:  Axillary  Oral  SpO2: 100% 100% 99% 98%  Weight:      Height:          Data Reviewed:  Basic Metabolic Panel: Recent Labs  Lab 12/22/22 0726 12/22/22 0751 12/22/22 1205 12/22/22 1632 12/23/22 0542 12/23/22 0950 12/24/22 0044 12/25/22 0032  NA 133* 131* 134*  --   --  135 135 132*  K 5.8* 5.4* 5.1  --   --  5.1 5.0 4.4  CL 98  --  97*  --   --  100 97* 96*  CO2 27  --  27  --   --  27 27 27   GLUCOSE 114*  --  120*  --   --  74 101* 121*  BUN 28*  --  30*  --   --  34* 22 23  CREATININE 1.68*  --  1.41*  --   --  1.15 0.92 0.91  CALCIUM 8.4*  --  8.7*  --   --  8.6* 8.8* 8.8*  MG  --   --   --  1.4* 3.1*  --  2.5* 2.4  PHOS  --   --   --  2.0* 5.0*  --  3.7 2.0*    CBC: Recent Labs  Lab 12/21/22 1052 12/22/22 0726 12/22/22 0751 12/23/22 0950 12/24/22 0044 12/25/22 0032  WBC 19.7* 10.8*  --  16.9* 17.8* 18.1*  HGB 13.1 11.6* 13.6 11.9* 13.0 12.9*  HCT 42.4 37.8* 40.0 36.9* 41.2 39.8  MCV 69.1* 69.0*  --  67.8* 68.0* 65.6*  PLT 368 319  --  325 335 348    LFT Recent Labs  Lab 12/21/22 0924 12/22/22 0726  AST 76* 26  ALT 62* 40  ALKPHOS 79 54  BILITOT 0.6 0.3  PROT 7.9 6.2*  ALBUMIN 4.0 3.1*     Antibiotics: Anti-infectives (From admission, onward)    Start     Dose/Rate Route Frequency Ordered Stop   12/21/22 1200  azithromycin (ZITHROMAX) 250 mg in dextrose 5 % 125 mL IVPB        250 mg 127.5  mL/hr over 60 Minutes Intravenous Every 24 hours 12/21/22 1104 12/25/22 1339        DVT prophylaxis: Heparin  Code Status: Full code  Family Communication: No family at bedside   CONSULTS PCCM   Objective    Physical Examination:   General: Appears in no acute distress Cardiovascular: S1-S2, regular, no murmur auscultated Respiratory: Bilateral wheezing auscultated Abdomen: Soft, nontender, no organomegaly Extremities: No edema in the lower extremities Neurologic: Cranial nerves II through XII grossly intact, no focal deficit noted   Status is: Inpatient: COPD exacerbation         [place]   [facility] If 7PM-7AM, please contact night-coverage at [email or web address], Office  [phone]   12/25/2022, 2:33 PM  LOS: 4 days

## 2022-12-26 ENCOUNTER — Other Ambulatory Visit (HOSPITAL_COMMUNITY): Payer: Self-pay

## 2022-12-26 LAB — MAGNESIUM: Magnesium: 2.3 mg/dL (ref 1.7–2.4)

## 2022-12-26 LAB — CULTURE, BLOOD (ROUTINE X 2)
Culture: NO GROWTH
Culture: NO GROWTH
Special Requests: ADEQUATE

## 2022-12-26 LAB — CBC
HCT: 39.1 % (ref 39.0–52.0)
Hemoglobin: 12.8 g/dL — ABNORMAL LOW (ref 13.0–17.0)
MCH: 21.5 pg — ABNORMAL LOW (ref 26.0–34.0)
MCHC: 32.7 g/dL (ref 30.0–36.0)
MCV: 65.7 fL — ABNORMAL LOW (ref 80.0–100.0)
Platelets: 382 10*3/uL (ref 150–400)
RBC: 5.95 MIL/uL — ABNORMAL HIGH (ref 4.22–5.81)
RDW: 18.3 % — ABNORMAL HIGH (ref 11.5–15.5)
WBC: 14.4 10*3/uL — ABNORMAL HIGH (ref 4.0–10.5)
nRBC: 0 % (ref 0.0–0.2)

## 2022-12-26 LAB — LEGIONELLA PNEUMOPHILA SEROGP 1 UR AG: L. pneumophila Serogp 1 Ur Ag: NEGATIVE

## 2022-12-26 LAB — BASIC METABOLIC PANEL
Anion gap: 9 (ref 5–15)
BUN: 26 mg/dL — ABNORMAL HIGH (ref 8–23)
CO2: 25 mmol/L (ref 22–32)
Calcium: 8.6 mg/dL — ABNORMAL LOW (ref 8.9–10.3)
Chloride: 99 mmol/L (ref 98–111)
Creatinine, Ser: 0.88 mg/dL (ref 0.61–1.24)
GFR, Estimated: >60 mL/min (ref >60)
Glucose, Bld: 168 mg/dL — ABNORMAL HIGH (ref 70–99)
Potassium: 4.4 mmol/L (ref 3.5–5.1)
Sodium: 133 mmol/L — ABNORMAL LOW (ref 135–145)

## 2022-12-26 LAB — PHOSPHORUS: Phosphorus: 3 mg/dL (ref 2.5–4.6)

## 2022-12-26 LAB — GLUCOSE, CAPILLARY
Glucose-Capillary: 158 mg/dL — ABNORMAL HIGH (ref 70–99)
Glucose-Capillary: 113 mg/dL — ABNORMAL HIGH (ref 70–99)

## 2022-12-26 MED ORDER — IPRATROPIUM-ALBUTEROL 0.5-2.5 (3) MG/3ML IN SOLN
3.0000 mL | Freq: Four times a day (QID) | RESPIRATORY_TRACT | 0 refills | Status: AC | PRN
  Filled 2022-12-26: qty 360, 30d supply, fill #0

## 2022-12-26 MED ORDER — GUAIFENESIN ER 600 MG PO TB12
1200.0000 mg | ORAL_TABLET | Freq: Two times a day (BID) | ORAL | 0 refills | Status: AC
  Filled 2022-12-26: qty 20, 5d supply, fill #0

## 2022-12-26 MED ORDER — PREDNISONE 10 MG PO TABS
ORAL_TABLET | ORAL | 0 refills | Status: AC
  Filled 2022-12-26: qty 10, 4d supply, fill #0

## 2022-12-26 MED ORDER — AMLODIPINE BESYLATE 10 MG PO TABS
10.0000 mg | ORAL_TABLET | Freq: Every day | ORAL | 2 refills | Status: AC
  Filled 2022-12-26: qty 30, 30d supply, fill #0
  Filled 2023-01-23: qty 30, 30d supply, fill #1
  Filled 2023-01-23: qty 30, 30d supply, fill #0

## 2022-12-26 MED ORDER — FLUTICASONE-SALMETEROL 100-50 MCG/ACT IN AEPB
2.0000 | INHALATION_SPRAY | Freq: Two times a day (BID) | RESPIRATORY_TRACT | 0 refills | Status: DC
  Filled 2022-12-26: qty 60, 15d supply, fill #0

## 2022-12-26 NOTE — TOC Transition Note (Signed)
Transition of Care [facility]) - CM/SW Discharge Note   Patient Details  Name: Colton Ford MRN: [id number] Date of Birth: 1961/08/06  [facility] CM/SW Contact:  Cyndi Bender, RN Phone Number: 12/26/2022, 2:29 PM   Clinical Narrative:      Patient stable for discharge.  FC emailed for possible medicaid approval.  [place] letter re-instated.  Patient aware this will be the last time MATCH can be used in the next 12 months.  Patient agreeable to see PCP.  New PCP apt on AVS. Patient can ride bus to apts. Friend will transport home. Final next level of care: Home/Self Care Barriers to Discharge: Barriers Resolved   Patient Goals and CMS Choice    Return home  Discharge Placement               home          Discharge Plan and Services Additional resources added to the After Visit Summary for   In-house Referral: PCP / [facility] Connect Discharge Planning Services: Follow-up appt scheduled, Medication Assistance, [place] [facility]                                 Social Determinants of Health (SDOH) Interventions SDOH Screenings   Tobacco Use: High Risk (12/21/2022)     Readmission Risk Interventions    12/26/2022    2:28 PM  Readmission Risk Prevention Plan  Transportation Screening Complete  Medication Review ([place]) Complete  PCP or Specialist appointment within 3-5 days of discharge Not Complete  PCP/Specialist Appt Not Complete comments apt 01/03/23  [place] or Home Care Consult Complete  SW Recovery Care/Counseling Consult Complete  Palliative Care Screening Not [place] Not Applicable

## 2022-12-26 NOTE — Discharge Summary (Signed)
Physician Discharge Summary   Patient: Colton Ford MRN: [id number] DOB: October 28, 1960  Admit date:     12/21/2022  Discharge date: 12/26/22  Discharge Physician: Oswald Hillock   PCP: Tanda Rockers, MD   Recommendations at discharge:   Follow-up PCP in 1 week  Discharge Diagnoses: Principal Problem:   COPD exacerbation ([place]) Active Problems:   Acute respiratory failure with hypoxia (HCC)   Malnutrition of moderate degree   Cocaine abuse (HCC)   AKI (acute kidney injury) ([place])   Chronic obstructive pulmonary disease (HCC)   Acute respiratory failure with hypoxia and hypercapnia (HCC)  Resolved Problems:   * No resolved hospital problems. *  Hospital Course: 62 year old male with past medical history of COPD/asthma, tobacco abuse, hypertension, anemia presented to ED on 3/21 with shortness of breath. Patient was found to be hypoxemic. Received bronchodilators, Solu-Medrol. Found to be in acute exacerbation of COPD required BiPAP and then later intubation. Patient was extubated yesterday and transferred to medical floor.   Assessment and Plan:  Acute hypoxemic and hypercarbic respiratory failure -In setting of COPD versus asthma exacerbation, also had rhinovirus infection -History of THC, cocaine use, tobacco use -COVID-19 and friends are negative -Patient was intubated and now extubated -Patient is currently on Solu-Medrol 40 mg IV every 12 hours, Pulmicort nebulizer twice daily, arformoterol nebulization twice daily, revefenacin nebulization daily We discontinued Yupelri and arformoterol nebulizer -Started on DuoNeb nebulizers every 6 hours scheduled. -Will discharge home on prednisone taper, DuoNeb nebulizers every 6 hours as needed, Advair inhaler twice a day   Hypertension -Continue amlodipine -Dose of amlodipine increased to 10 mg daily   Hyperglycemia -Secondary to steroids as above -Hemoglobin A1c 6.2   Leukocytosis -Likely in setting of steroids           Consultants: PCCM Procedures performed:  Disposition: Home Diet recommendation:  Discharge Diet Orders (From admission, onward)     Start     Ordered   12/26/22 0000  Diet - low sodium heart healthy        12/26/22 1323           Regular diet DISCHARGE MEDICATION: Allergies as of 12/26/2022   No Known Allergies      Medication List     TAKE these medications    acetaminophen 500 MG tablet Commonly known as: TYLENOL Take 500-1,000 mg by mouth as needed for moderate pain.   albuterol 108 (90 Base) MCG/ACT inhaler Commonly known as: VENTOLIN HFA Inhale 2 puffs into the lungs every 6 (six) hours as needed for wheezing or shortness of breath. What changed:  how much to take when to take this Another medication with the same name was removed. Continue taking this medication, and follow the directions you see here.   amLODipine 10 MG tablet Commonly known as: NORVASC Take 1 tablet (10 mg total) by mouth daily. Start taking on: December 27, 2022 What changed:  medication strength how much to take   fluticasone-salmeterol 115-21 MCG/ACT inhaler Commonly known as: Advair HFA Inhale 2 puffs into the lungs 2 (two) times daily.   guaiFENesin 600 MG 12 hr tablet Commonly known as: MUCINEX Take 2 tablets (1,200 mg total) by mouth 2 (two) times daily for 5 days.   ipratropium-albuterol 0.5-2.5 (3) MG/3ML Soln Commonly known as: DUONEB Take 3 mLs by nebulization every 6 (six) hours as needed.   predniSONE 10 MG tablet Commonly known as: DELTASONE Prednisone 40 mg po daily x 1 day then Prednisone 30 mg  po daily x 1 day then Prednisone 20 mg po daily x 1 day then Prednisone 10 mg daily x 1 day then stop... What changed: See the new instructions.               Durable Medical Equipment  (From admission, onward)           Start     Ordered   12/26/22 1325  For home use only DME Nebulizer machine  Once       Question Answer Comment  Patient needs a  nebulizer to treat with the following condition COPD (chronic obstructive pulmonary disease) ([place])   Length of Need Lifetime      12/26/22 1324            Discharge Exam: Filed Weights   12/24/22 0500 12/25/22 0500 12/26/22 0306  Weight: 67.2 kg 58.3 kg 58.7 kg   General-appears in no acute distress Heart-S1-S2, regular, no murmur auscultated Lungs-clear to auscultation bilaterally, no wheezing or crackles auscultated Abdomen-soft, nontender, no organomegaly Extremities-no edema in the lower extremities Neuro-alert, oriented x3, no focal deficit noted  Condition at discharge: good  The results of significant diagnostics from this hospitalization (including imaging, microbiology, ancillary and laboratory) are listed below for reference.   Imaging Studies: DG CHEST PORT 1 VIEW  Result Date: 12/23/2022 CLINICAL DATA:  Hypoxia EXAM: PORTABLE CHEST 1 VIEW COMPARISON:  12/21/2022 FINDINGS: Endotracheal tube terminates in the midthoracic trachea. Enteric tube extends into the stomach. Heart size within normal limits. Lungs are hyperinflated. No focal airspace consolidation, pleural effusion, or pneumothorax. IMPRESSION: 1. Hyperinflated lungs without focal airspace consolidation. 2. Satisfactory position of endotracheal and enteric tubes. Electronically Signed   By: Davina Poke D.O.   On: 12/23/2022 11:12   DG Abd Portable 1V  Result Date: 12/21/2022 CLINICAL DATA:  NG tube placement EXAM: PORTABLE ABDOMEN - 1 VIEW COMPARISON:  Previous studies including the examination done earlier today FINDINGS: Cardiac size is within normal limits. Low position of diaphragms suggests COPD. Upper lung fields are not included in the image. Visualized lung fields are clear of any infiltrates or pulmonary edema. Tip and side port in NG tube noted in the stomach. IMPRESSION: Tip and side port in NG tube are noted within the stomach. Electronically Signed   By: Elmer Picker M.D.   On: 12/21/2022  12:32   DG Chest Portable 1 View  Result Date: 12/21/2022 CLINICAL DATA:  Verify tube placement. EXAM: PORTABLE CHEST 1 VIEW COMPARISON:  Chest radiograph performed earlier on the same date at 9:56 a.m. FINDINGS: The heart size and mediastinal contours are within normal limits. Aortic atherosclerotic calcifications. Endotracheal tube with distal tip below the clavicular heads, unchanged. Interval placement of feeding tube with side port below the GE junction and distal tip not included. No acute osseous abnormality. IMPRESSION: Interval placement of feeding tube with side port below the GE junction and distal tip not included. No other significant interval change. Electronically Signed   By: Keane Police D.O.   On: 12/21/2022 11:41   DG Chest Portable 1 View  Result Date: 12/21/2022 CLINICAL DATA:  Provided history: Shortness of breath. EXAM: PORTABLE CHEST 1 VIEW COMPARISON:  Prior chest radiographs 12/07/2022 and earlier. FINDINGS: ET tube present with tip immediately below the level of the clavicular heads. Heart size within normal limits. Aortic atherosclerosis. No appreciable airspace consolidation. Subtle blunting of the left lateral costophrenic angle, which may reflect presence of a trace left pleural effusion. No evidence of pneumothorax.  No acute osseous abnormality identified. IMPRESSION: 1. ET tube present with tip just below the level of the clavicular heads. 2. Possible trace left pleural effusion. 3. Aortic Atherosclerosis (ICD10-I70.0). Electronically Signed   By: Kellie Simmering D.O.   On: 12/21/2022 10:11   ECHOCARDIOGRAM COMPLETE  Result Date: 12/07/2022    ECHOCARDIOGRAM REPORT   Patient Name:   CARSTEN KOCIS Date of Exam: 12/07/2022 Medical Rec #:  [id number]      Height:       72.0 in Accession #:    [id number]     Weight:       138.0 lb Date of Birth:  01-02-1961      BSA:          1.820 m Patient Age:    69 years       BP:           155/87 mmHg Patient Gender: M              HR:            86 bpm. Exam Location:  Inpatient Procedure: 2D Echo, Cardiac Doppler, 3D Echo and Color Doppler Indications:    R94.31 Abnormal EKG  History:        Patient has no prior history of Echocardiogram examinations.                 Signs/Symptoms:Shortness of Breath; Risk Factors:Hypertension                 and Current Smoker.  Sonographer:    Wilkie Aye RVT RCS Referring Phys: [place]  1. Left ventricular ejection fraction, by estimation, is 60 to 65%. Left ventricular ejection fraction by 3D volume is 60 %. The left ventricle has normal function. The left ventricle has no regional wall motion abnormalities. Left ventricular diastolic  parameters were normal.  2. Right ventricular systolic function is normal. The right ventricular size is normal. Tricuspid regurgitation signal is inadequate for assessing PA pressure.  3. The mitral valve is normal in structure. No evidence of mitral valve regurgitation. No evidence of mitral stenosis.  4. The aortic valve is normal in structure. Aortic valve regurgitation is not visualized. No aortic stenosis is present.  5. The inferior vena cava is dilated in size with >50% respiratory variability, suggesting right atrial pressure of 8 mmHg. FINDINGS  Left Ventricle: Left ventricular ejection fraction, by estimation, is 60 to 65%. Left ventricular ejection fraction by 3D volume is 60 %. The left ventricle has normal function. The left ventricle has no regional wall motion abnormalities. The left ventricular internal cavity size was normal in size. There is no left ventricular hypertrophy. Left ventricular diastolic parameters were normal. Right Ventricle: The right ventricular size is normal. No increase in right ventricular wall thickness. Right ventricular systolic function is normal. Tricuspid regurgitation signal is inadequate for assessing PA pressure. Left Atrium: Left atrial size was normal in size. Right Atrium: Right atrial size was normal in  size. Pericardium: There is no evidence of pericardial effusion. Mitral Valve: The mitral valve is normal in structure. No evidence of mitral valve regurgitation. No evidence of mitral valve stenosis. Tricuspid Valve: The tricuspid valve is normal in structure. Tricuspid valve regurgitation is not demonstrated. No evidence of tricuspid stenosis. Aortic Valve: The aortic valve is normal in structure. Aortic valve regurgitation is not visualized. No aortic stenosis is present. Aortic valve mean gradient measures 3.0 mmHg. Aortic valve peak gradient measures 5.3  mmHg. Aortic valve area, by VTI measures 4.05 cm. Pulmonic Valve: The pulmonic valve was not well visualized. Pulmonic valve regurgitation is not visualized. No evidence of pulmonic stenosis. Aorta: The aortic root is normal in size and structure. Venous: The inferior vena cava is dilated in size with greater than 50% respiratory variability, suggesting right atrial pressure of 8 mmHg. IAS/Shunts: No atrial level shunt detected by color flow Doppler.  LEFT VENTRICLE PLAX 2D LVIDd:         4.20 cm         Diastology LVIDs:         2.60 cm         LV e' medial:    6.64 cm/s LV PW:         1.20 cm         LV E/e' medial:  12.2 LV IVS:        1.00 cm         LV e' lateral:   9.46 cm/s LVOT diam:     2.20 cm         LV E/e' lateral: 8.5 LV SV:         80 LV SV Index:   44 LVOT Area:     3.80 cm        3D Volume EF                                LV 3D EF:    Left                                             ventricul                                             ar                                             ejection                                             fraction                                             by 3D                                             volume is                                             60 %.  3D Volume EF:                                3D EF:        60 %                                LV EDV:       151 ml                                 LV ESV:       61 ml                                LV SV:        91 ml RIGHT VENTRICLE             IVC RV Basal diam:  3.90 cm     IVC diam: 3.00 cm RV S prime:     15.50 cm/s TAPSE (M-mode): 2.8 cm LEFT ATRIUM             Index        RIGHT ATRIUM           Index LA diam:        3.70 cm 2.03 cm/m   RA Area:     13.30 cm LA Vol (A2C):   42.7 ml 23.44 ml/m  RA Volume:   33.40 ml  18.35 ml/m LA Vol (A4C):   42.6 ml 23.41 ml/m LA Biplane Vol: 45.6 ml 25.06 ml/m  AORTIC VALVE AV Area (Vmax):    3.97 cm AV Area (Vmean):   3.95 cm AV Area (VTI):     4.05 cm AV Vmax:           115.00 cm/s AV Vmean:          75.100 cm/s AV VTI:            0.197 m AV Peak Grad:      5.3 mmHg AV Mean Grad:      3.0 mmHg LVOT Vmax:         120.00 cm/s LVOT Vmean:        78.100 cm/s LVOT VTI:          0.210 m LVOT/AV VTI ratio: 1.07  AORTA Ao Root diam: 3.50 cm MITRAL VALVE MV Area (PHT): 3.46 cm    SHUNTS MV Decel Time: 219 msec    Systemic VTI:  0.21 m MV E velocity: 80.70 cm/s  Systemic Diam: 2.20 cm MV A velocity: 71.30 cm/s MV E/A ratio:  1.13 Mihai Croitoru MD Electronically signed by Sanda Klein MD Signature Date/Time: 12/07/2022/3:54:36 PM    Final    DG Chest Portable 1 View  Result Date: 12/07/2022 CLINICAL DATA:  Shortness of breath EXAM: PORTABLE CHEST 1 VIEW COMPARISON:  11/02/2022 FINDINGS: Hyperinflation. Heart and mediastinal contours are within normal limits. No focal opacities or effusions. No acute bony abnormality. Aortic atherosclerosis. IMPRESSION: Hyperinflation.  No active cardiopulmonary disease. Electronically Signed   By: Rolm Baptise M.D.   On: 12/07/2022 02:52    Microbiology: Results for orders placed or performed during the hospital encounter of 12/21/22  Blood culture (routine x 2)     Status: None  Collection Time: 12/21/22  9:21 AM   Specimen: BLOOD  Result Value Ref Range Status   Specimen Description BLOOD SITE NOT SPECIFIED  Final   Special Requests   Final     BOTTLES DRAWN AEROBIC AND ANAEROBIC Blood Culture adequate volume   Culture   Final    NO GROWTH 5 DAYS Performed at [facility], [address] [place], [place] [address]    Report Status 12/26/2022 FINAL  Final  Blood culture (routine x 2)     Status: None   Collection Time: 12/21/22  9:26 AM   Specimen: BLOOD  Result Value Ref Range Status   Specimen Description BLOOD RIGHT ANTECUBITAL  Final   Special Requests   Final    BOTTLES DRAWN AEROBIC AND ANAEROBIC Blood Culture results may not be optimal due to an inadequate volume of blood received in culture bottles   Culture   Final    NO GROWTH 5 DAYS Performed at [facility], [place] [address] [place], [place] [address]    Report Status 12/26/2022 FINAL  Final  Resp panel by RT-PCR (RSV, Flu A&B, Covid)     Status: None   Collection Time: 12/21/22  9:49 AM   Specimen: Nasal Swab  Result Value Ref Range Status   SARS Coronavirus 2 by RT PCR NEGATIVE NEGATIVE Final   Influenza A by PCR NEGATIVE NEGATIVE Final   Influenza B by PCR NEGATIVE NEGATIVE Final    Comment: (NOTE) The Xpert Xpress SARS-CoV-2/FLU/RSV plus assay is intended as an aid in the diagnosis of influenza from Nasopharyngeal swab specimens and should not be used as a sole basis for treatment. Nasal washings and aspirates are unacceptable for Xpert Xpress SARS-CoV-2/FLU/RSV testing.  Fact Sheet for Patients: EntrepreneurPulse.com.au  Fact Sheet for Healthcare Providers: IncredibleEmployment.be  This test is not yet approved or cleared by the [place] FDA and has been authorized for detection and/or diagnosis of SARS-CoV-2 by FDA under an Emergency Use Authorization (EUA). This EUA will remain in effect (meaning this test can be used) for the duration of the COVID-19 declaration under Section 564(b)(1) of the Act, 21 U.S.C. section 360bbb-3(b)(1), unless the authorization is terminated or revoked.      Resp Syncytial Virus by PCR NEGATIVE NEGATIVE Final    Comment: (NOTE) Fact Sheet for Patients: EntrepreneurPulse.com.au  Fact Sheet for Healthcare Providers: IncredibleEmployment.be  This test is not yet approved or cleared by the [place] FDA and has been authorized for detection and/or diagnosis of SARS-CoV-2 by FDA under an Emergency Use Authorization (EUA). This EUA will remain in effect (meaning this test can be used) for the duration of the COVID-19 declaration under Section 564(b)(1) of the Act, 21 U.S.C. section 360bbb-3(b)(1), unless the authorization is terminated or revoked.  Performed at [facility], [place] [address], [place], [place] [address]   MRSA Next Gen by PCR, Nasal     Status: Abnormal   Collection Time: 12/21/22 11:19 AM   Specimen: Nasal Mucosa; Nasal Swab  Result Value Ref Range Status   MRSA by PCR Next Gen DETECTED (A) NOT DETECTED Final    Comment: RESULT CALLED TO, READ BACK BY AND VERIFIED WITH: RN JEANETTE ON 12/21/22 @ 1511 BY DRT (NOTE) The GeneXpert MRSA Assay (FDA approved for NASAL specimens only), is one component of a comprehensive MRSA colonization surveillance program. It is not intended to diagnose MRSA infection nor to guide or monitor treatment for MRSA infections. Test performance is not  FDA approved in patients less than 48 years old. Performed at [facility], [place] [address], [place], [place] [address]   Culture, Respiratory w Gram Stain     Status: None   Collection Time: 12/23/22 11:00 AM   Specimen: Tracheal Aspirate; Respiratory  Result Value Ref Range Status   Specimen Description TRACHEAL ASPIRATE  Final   Special Requests NONE  Final   Gram Stain   Final    MODERATE WBC PRESENT, PREDOMINANTLY PMN FEW GRAM NEGATIVE COCCOBACILLI    Culture   Final    MODERATE HAEMOPHILUS INFLUENZAE BETA LACTAMASE NEGATIVE Performed at [facility], [place] [address]  [place], [place] [address]    Report Status 12/25/2022 FINAL  Final  Respiratory (~20 pathogens) panel by PCR     Status: Abnormal   Collection Time: 12/23/22 11:11 AM   Specimen: Nasopharyngeal Swab; Respiratory  Result Value Ref Range Status   Adenovirus NOT DETECTED NOT DETECTED Final   Coronavirus 229E NOT DETECTED NOT DETECTED Final    Comment: (NOTE) The Coronavirus on the Respiratory Panel, DOES NOT test for the novel  Coronavirus (2019 nCoV)    Coronavirus HKU1 NOT DETECTED NOT DETECTED Final   Coronavirus NL63 NOT DETECTED NOT DETECTED Final   Coronavirus OC43 NOT DETECTED NOT DETECTED Final   Metapneumovirus NOT DETECTED NOT DETECTED Final   Rhinovirus / Enterovirus DETECTED (A) NOT DETECTED Final   Influenza A NOT DETECTED NOT DETECTED Final   Influenza B NOT DETECTED NOT DETECTED Final   Parainfluenza Virus 1 NOT DETECTED NOT DETECTED Final   Parainfluenza Virus 2 NOT DETECTED NOT DETECTED Final   Parainfluenza Virus 3 NOT DETECTED NOT DETECTED Final   Parainfluenza Virus 4 NOT DETECTED NOT DETECTED Final   Respiratory Syncytial Virus NOT DETECTED NOT DETECTED Final   Bordetella pertussis NOT DETECTED NOT DETECTED Final   Bordetella Parapertussis NOT DETECTED NOT DETECTED Final   Chlamydophila pneumoniae NOT DETECTED NOT DETECTED Final   Mycoplasma pneumoniae NOT DETECTED NOT DETECTED Final    Comment: Performed at [facility], [place] [address] [place], [place] [address]    Labs: CBC: Recent Labs  Lab 12/22/22 [phone] 12/22/22 0751 12/23/22 0950 12/24/22 0044 12/25/22 0032 12/26/22 0017  WBC 10.8*  --  16.9* 17.8* 18.1* 14.4*  HGB 11.6* 13.6 11.9* 13.0 12.9* 12.8*  HCT 37.8* 40.0 36.9* 41.2 39.8 39.1  MCV 69.0*  --  67.8* 68.0* 65.6* 65.7*  PLT 319  --  325 335 348 [id number]   Basic Metabolic Panel: Recent Labs  Lab 12/22/22 1205 12/22/22 1632 12/23/22 0542 12/23/22 0950 12/24/22 0044 12/25/22 0032 12/26/22 0017  NA 134*  --   --  135 135 132* 133*  K 5.1   --   --  5.1 5.0 4.4 4.4  CL 97*  --   --  100 97* 96* 99  CO2 27  --   --  27 27 27 25   GLUCOSE 120*  --   --  74 101* 121* 168*  BUN 30*  --   --  34* 22 23 26*  CREATININE 1.41*  --   --  1.15 0.92 0.91 0.88  CALCIUM 8.7*  --   --  8.6* 8.8* 8.8* 8.6*  MG  --  1.4* 3.1*  --  2.5* 2.4 2.3  PHOS  --  2.0* 5.0*  --  3.7 2.0* 3.0   Liver Function Tests: Recent Labs  Lab 12/21/22 0924 12/22/22 0726  AST  76* 26  ALT 62* 40  ALKPHOS 79 54  BILITOT 0.6 0.3  PROT 7.9 6.2*  ALBUMIN 4.0 3.1*   CBG: Recent Labs  Lab 12/25/22 1115 12/25/22 1622 12/25/22 2103 12/26/22 0623 12/26/22 1127  GLUCAP 123* 217* 141* 158* 113*    Discharge time spent: greater than 30 minutes.  Signed: Oswald Hillock, MD [facility] 12/26/2022

## 2022-12-26 NOTE — Social Work (Signed)
CSW spoke with pt to discuss Substance Use Counseling and resources. Pt states he had gone to a party. He usually does not use substances, and he thinks that is one of the reasons he got so sick. He states he does not need resources at this time, and does not plan on using again. TOC is available for further needs.

## 2023-01-03 ENCOUNTER — Inpatient Hospital Stay: Payer: Self-pay | Admitting: Nurse Practitioner

## 2023-01-23 ENCOUNTER — Other Ambulatory Visit (HOSPITAL_COMMUNITY): Payer: Self-pay

## 2023-01-24 ENCOUNTER — Other Ambulatory Visit (HOSPITAL_COMMUNITY): Payer: Self-pay

## 2023-01-24 ENCOUNTER — Other Ambulatory Visit: Payer: Self-pay | Admitting: Internal Medicine

## 2023-01-24 MED ORDER — ALBUTEROL SULFATE HFA 108 (90 BASE) MCG/ACT IN AERS
2.0000 | INHALATION_SPRAY | Freq: Four times a day (QID) | RESPIRATORY_TRACT | 0 refills | Status: DC | PRN
  Filled 2023-01-24: qty 6.7, 25d supply, fill #0

## 2023-01-25 ENCOUNTER — Ambulatory Visit (INDEPENDENT_AMBULATORY_CARE_PROVIDER_SITE_OTHER): Payer: Medicaid Other | Admitting: Student

## 2023-01-25 ENCOUNTER — Encounter: Payer: Self-pay | Admitting: Student

## 2023-01-25 ENCOUNTER — Encounter (HOSPITAL_COMMUNITY): Payer: Self-pay

## 2023-01-25 ENCOUNTER — Other Ambulatory Visit (HOSPITAL_COMMUNITY): Payer: Self-pay

## 2023-01-25 VITALS — BP 116/74 | HR 90 | Temp 98.0°F | Ht 72.0 in | Wt 154.0 lb

## 2023-01-25 DIAGNOSIS — J449 Chronic obstructive pulmonary disease, unspecified: Secondary | ICD-10-CM

## 2023-01-25 LAB — CBC WITH DIFFERENTIAL/PLATELET
Basophils Absolute: 0.1 10*3/uL (ref 0.0–0.1)
Basophils Relative: 0.8 % (ref 0.0–3.0)
Eosinophils Absolute: 0.1 10*3/uL (ref 0.0–0.7)
Eosinophils Relative: 0.8 % (ref 0.0–5.0)
HCT: 42.6 % (ref 39.0–52.0)
Hemoglobin: 13.4 g/dL (ref 13.0–17.0)
Lymphocytes Relative: 26.1 % (ref 12.0–46.0)
Lymphs Abs: 1.9 10*3/uL (ref 0.7–4.0)
MCHC: 31.5 g/dL (ref 30.0–36.0)
MCV: 67.6 fl — ABNORMAL LOW (ref 78.0–100.0)
Monocytes Absolute: 0.8 10*3/uL (ref 0.1–1.0)
Monocytes Relative: 11 % (ref 3.0–12.0)
Neutro Abs: 4.5 10*3/uL (ref 1.4–7.7)
Neutrophils Relative %: 61.3 % (ref 43.0–77.0)
Platelets: 352 10*3/uL (ref 150.0–400.0)
RBC: 6.31 Mil/uL — ABNORMAL HIGH (ref 4.22–5.81)
RDW: 19.4 % — ABNORMAL HIGH (ref 11.5–15.5)
WBC: 7.3 10*3/uL (ref 4.0–10.5)

## 2023-01-25 MED ORDER — ALBUTEROL SULFATE HFA 108 (90 BASE) MCG/ACT IN AERS
2.0000 | INHALATION_SPRAY | Freq: Four times a day (QID) | RESPIRATORY_TRACT | 6 refills | Status: DC | PRN
  Filled 2023-01-25: qty 18, 25d supply, fill #0
  Filled 2023-03-26: qty 18, 25d supply, fill #1
  Filled 2023-04-23: qty 18, 25d supply, fill #2
  Filled 2023-05-29: qty 18, 25d supply, fill #3
  Filled 2023-06-19: qty 18, 25d supply, fill #4

## 2023-01-25 MED ORDER — BREZTRI AEROSPHERE 160-9-4.8 MCG/ACT IN AERO
2.0000 | INHALATION_SPRAY | Freq: Two times a day (BID) | RESPIRATORY_TRACT | 11 refills | Status: DC
  Filled 2023-01-25 – 2023-03-01 (×2): qty 10.7, 30d supply, fill #0
  Filled 2023-03-21 – 2023-03-26 (×2): qty 10.7, 30d supply, fill #1
  Filled 2023-04-23: qty 10.7, 30d supply, fill #2
  Filled 2023-05-29: qty 10.7, 30d supply, fill #3
  Filled 2023-06-19 – 2023-06-20 (×2): qty 10.7, 30d supply, fill #4
  Filled 2023-07-26: qty 10.7, 30d supply, fill #5
  Filled 2023-08-27 (×2): qty 10.7, 30d supply, fill #6
  Filled 2023-09-19: qty 10.7, 30d supply, fill #7
  Filled 2023-10-09 – 2023-10-11 (×2): qty 10.7, 30d supply, fill #8
  Filled 2023-11-13: qty 10.7, 30d supply, fill #9
  Filled 2023-12-05 (×2): qty 10.7, 30d supply, fill #10
  Filled 2023-12-28: qty 10.7, 30d supply, fill #11

## 2023-01-25 MED ORDER — BREZTRI AEROSPHERE 160-9-4.8 MCG/ACT IN AERO
2.0000 | INHALATION_SPRAY | Freq: Two times a day (BID) | RESPIRATORY_TRACT | 11 refills | Status: DC
  Filled 2023-01-25: qty 1, fill #0

## 2023-01-25 MED ORDER — ALBUTEROL SULFATE HFA 108 (90 BASE) MCG/ACT IN AERS
2.0000 | INHALATION_SPRAY | Freq: Four times a day (QID) | RESPIRATORY_TRACT | 2 refills | Status: DC | PRN
  Filled 2023-01-25: qty 18, fill #0

## 2023-01-25 NOTE — Patient Instructions (Signed)
-   Great job stopping smoking - labs today - PFTs next visit  - start breztri 2 puffs twice daily rinse mouth and brush teeth/tongue after each use - see you in 3 months or sooner if need be!

## 2023-01-25 NOTE — Progress Notes (Signed)
Synopsis: Referred for copd with acute exacerbation by Nyoka Cowden, MD  Subjective:   PATIENT ID: [name] GENDER: male DOB: 07-15-1961, MRN: [id number]  Chief Complaint  Patient presents with   Acute Visit    Increased SOB over the past several months. He has occ wheezing. Denies cough. He is using Duoneb 2-3 x per day.    Recent AECOPD requiring mech vent. Doing ok post hospitalization but does notice gradually increasing DOE after he's done with prednisone. DOE to 1 flight of stairs but ok on level ground. Not much in way oc cough currently.    5 courses of prednisone this year roughly.   Taking wixela 1 puff twice daily. Using duoneb three times daily.   Quit smoking 2 months ago  Otherwise pertinent review of systems is negative.  Past Medical History:  Diagnosis Date   Agitation    Asthma    Mild anemia      Family History  Problem Relation Age of Onset   Asthma Sister    Heart disease Sister    Heart disease Brother      No past surgical history on file.  Social History   Socioeconomic History   Marital status: Single    Spouse name: Not on file   Number of children: 2   Years of education: Not on file   Highest education level: Not on file  Occupational History   Occupation: English as a second language teacher    Employer: [employer]  Tobacco Use   Smoking status: Former    Packs/day: 0.75    Years: 30.00    Additional pack years: 0.00    Total pack years: 22.50    Types: Cigarettes    Quit date: 12/01/2022    Years since quitting: 0.1   Smokeless tobacco: Never  Vaping Use   Vaping Use: Never used  Substance and Sexual Activity   Alcohol use: No    Alcohol/week: 0.0 standard drinks of alcohol   Drug use: No   Sexual activity: Yes  Other Topics Concern   Not on file  Social History Narrative   Not on file   Social Determinants of Health   Financial Resource Strain: Not on file  Food Insecurity: Not on file  Transportation Needs: Not on file   Physical Activity: Not on file  Stress: Not on file  Social Connections: Not on file  Intimate Partner Violence: Not on file     No Known Allergies   Outpatient Medications Prior to Visit  Medication Sig Dispense Refill   acetaminophen (TYLENOL) 500 MG tablet Take 500-1,000 mg by mouth as needed for moderate pain.     amLODipine (NORVASC) 10 MG tablet Take 1 tablet (10 mg total) by mouth daily. 30 tablet 2   fluticasone-salmeterol (WIXELA INHUB) 100-50 MCG/ACT AEPB Inhale 2 puffs into the lungs 2 (two) times daily. (Patient taking differently: Inhale 1 puff into the lungs 2 (two) times daily.) 60 each 0   ipratropium-albuterol (DUONEB) 0.5-2.5 (3) MG/3ML SOLN Take 3 mLs by nebulization every 6 (six) hours as needed. 360 mL 0   albuterol (VENTOLIN HFA) 108 (90 Base) MCG/ACT inhaler Inhale 2 puffs into the lungs every 6 (six) hours as needed for wheezing or shortness of breath. (Patient not taking: Reported on 01/25/2023) 6.7 g 0   No facility-administered medications prior to visit.       Objective:   Physical Exam:  General appearance: 62 y.o., male, NAD, conversant, chroincally ill appearing Eyes:  anicteric sclerae; PERRL, tracking appropriately HENT: NCAT; MMM Neck: Trachea midline; no lymphadenopathy, no JVD Lungs: faint wheeze and rhonchi bl, with normal respiratory effort CV: RRR, no murmur  Abdomen: Soft, non-tender; non-distended, BS present  Extremities: No peripheral edema, warm Skin: Normal turgor and texture; no rash Psych: Appropriate affect Neuro: Alert and oriented to person and place, no focal deficit     Vitals:   01/25/23 1034  BP: 116/74  Pulse: 90  Temp: 98 F (36.7 C)  TempSrc: Oral  SpO2: 93%  Weight: 154 lb (69.9 kg)  Height: 6' (1.829 m)   93% on RA BMI Readings from Last 3 Encounters:  01/25/23 20.89 kg/m  12/26/22 17.55 kg/m  12/07/22 18.72 kg/m   Wt Readings from Last 3 Encounters:  01/25/23 154 lb (69.9 kg)  12/26/22 129 lb 6.6  oz (58.7 kg)  12/07/22 138 lb (62.6 kg)     CBC    Component Value Date/Time   WBC 14.4 (H) 12/26/2022 0017   RBC 5.95 (H) 12/26/2022 0017   HGB 12.8 (L) 12/26/2022 0017   HCT 39.1 12/26/2022 0017   PLT 382 12/26/2022 0017   MCV 65.7 (L) 12/26/2022 0017   MCH 21.5 (L) 12/26/2022 0017   MCHC 32.7 12/26/2022 0017   RDW 18.3 (H) 12/26/2022 0017   LYMPHSABS 1.7 11/02/2022 1700   MONOABS 0.7 11/02/2022 1700   EOSABS 0.2 11/02/2022 1700   BASOSABS 0.0 11/02/2022 1700   Eos up to 200  Chest Imaging: CTA Chest 11/02/22 subtle emphysema, scar/nodular opacities RML, lingula  Pulmonary Functions Testing Results:     No data to display           Echocardiogram 12/07/22:    1. Left ventricular ejection fraction, by estimation, is 60 to 65%. Left  ventricular ejection fraction by 3D volume is 60 %. The left ventricle has  normal function. The left ventricle has no regional wall motion  abnormalities. Left ventricular diastolic   parameters were normal.   2. Right ventricular systolic function is normal. The right ventricular  size is normal. Tricuspid regurgitation signal is inadequate for assessing  PA pressure.   3. The mitral valve is normal in structure. No evidence of mitral valve  regurgitation. No evidence of mitral stenosis.   4. The aortic valve is normal in structure. Aortic valve regurgitation is  not visualized. No aortic stenosis is present.   5. The inferior vena cava is dilated in size with >50% respiratory  variability, suggesting right atrial pressure of 8 mmHg.       Assessment & Plan:   # COPD gold E # ACOS Recurrent and life-threatening exacerbations  Plan: - Great job stopping smoking - cbc/diff today - PFTs next visit  - start breztri 2 puffs twice daily rinse mouth and brush teeth/tongue after each use - consideration of chronic azithromycin vs dupixent vs daliresp next visit if not under improved control - see you in 3 months or sooner if need  be!     Omar Person, MD [facility] 01/25/2023 10:44 AM

## 2023-01-26 ENCOUNTER — Other Ambulatory Visit (HOSPITAL_COMMUNITY): Payer: Self-pay

## 2023-01-26 LAB — IGE: IgE (Immunoglobulin E), Serum: 158 kU/L — ABNORMAL HIGH (ref <=114)

## 2023-01-29 ENCOUNTER — Telehealth: Payer: Self-pay

## 2023-01-29 ENCOUNTER — Inpatient Hospital Stay: Payer: Medicaid Other | Admitting: Nurse Practitioner

## 2023-01-29 NOTE — Telephone Encounter (Signed)
PA request received via CMM for Breztri Aerosphere 160-9-4.8MCG/ACT aerosol  PA has been submitted to [facility] and is pending determination  Key: [id number]

## 2023-01-29 NOTE — Telephone Encounter (Signed)
PA has been APPROVED from 01/29/2023-01/29/2024 

## 2023-02-19 ENCOUNTER — Other Ambulatory Visit: Payer: Self-pay

## 2023-02-19 ENCOUNTER — Telehealth: Payer: Self-pay

## 2023-02-19 MED ORDER — BREZTRI AEROSPHERE 160-9-4.8 MCG/ACT IN AERO: 2.0000 | INHALATION_SPRAY | Freq: Two times a day (BID) | RESPIRATORY_TRACT | 0 refills | Status: DC

## 2023-02-19 NOTE — Telephone Encounter (Signed)
Pt came in the office complaining of sob and needing a sample of Breztri. I gave him 1 sample and he asked for albuterol (VENTOLIN HFA) 108 (90 Base) MCG/ACT inhaler and Budeson-Glycopyrrol-Formoterol (BREZTRI AEROSPHERE) 160-9-4.8 MCG/ACT AERO to be sent into [facility] but pt has refills at the pharmacy already. So, nothing further needed.

## 2023-02-27 ENCOUNTER — Other Ambulatory Visit (HOSPITAL_COMMUNITY): Payer: Self-pay

## 2023-03-01 ENCOUNTER — Other Ambulatory Visit (HOSPITAL_COMMUNITY): Payer: Self-pay

## 2023-03-21 ENCOUNTER — Other Ambulatory Visit (HOSPITAL_COMMUNITY): Payer: Self-pay

## 2023-03-22 ENCOUNTER — Other Ambulatory Visit (HOSPITAL_COMMUNITY): Payer: Self-pay

## 2023-03-26 ENCOUNTER — Other Ambulatory Visit (HOSPITAL_COMMUNITY): Payer: Self-pay

## 2023-04-23 ENCOUNTER — Other Ambulatory Visit (HOSPITAL_COMMUNITY): Payer: Self-pay

## 2023-04-26 ENCOUNTER — Ambulatory Visit: Payer: Medicaid Other | Admitting: Pulmonary Disease

## 2023-04-26 NOTE — Progress Notes (Deleted)
Synopsis: Referred in July 2024 for COPD, establish care - former Dr. Thora Lance patient  Subjective:   PATIENT ID: Colton Ford GENDER: male DOB: May 10, 1961, MRN: [id number]   HPI  No chief complaint on file.  Colton Ford is a 62 year old male, former smoker with history of COPD-Asthma who returns to pulmonary clinic to establish care with new provider.     05/02/2012 PFTs: FVC 64, FEV1 42, ratio 51, DLCO 67. Severe obstruction with significant bronchodilator response (34% change)   Past Medical History:  Diagnosis Date   Agitation    Asthma    Mild anemia      Family History  Problem Relation Age of Onset   Asthma Sister    Heart disease Sister    Heart disease Brother      Social History   Socioeconomic History   Marital status: Single    Spouse name: Not on file   Number of children: 2   Years of education: Not on file   Highest education level: Not on file  Occupational History   Occupation: English as a second language teacher    Employer: [employer]  Tobacco Use   Smoking status: Former    Current packs/day: 0.00    Average packs/day: 0.8 packs/day for 30.0 years (22.5 ttl pk-yrs)    Types: Cigarettes    Start date: 11/30/1992    Quit date: 12/01/2022    Years since quitting: 0.4   Smokeless tobacco: Never  Vaping Use   Vaping status: Never Used  Substance and Sexual Activity   Alcohol use: No    Alcohol/week: 0.0 standard drinks of alcohol   Drug use: No   Sexual activity: Yes  Other Topics Concern   Not on file  Social History Narrative   Not on file   Social Determinants of Health   Financial Resource Strain: Not on file  Food Insecurity: Not on file  Transportation Needs: Not on file  Physical Activity: Not on file  Stress: Not on file  Social Connections: Not on file  Intimate Partner Violence: Not on file     No Known Allergies   Outpatient Medications Prior to Visit  Medication Sig Dispense Refill   acetaminophen (TYLENOL) 500 MG tablet Take  500-1,000 mg by mouth as needed for moderate pain.     albuterol (VENTOLIN HFA) 108 (90 Base) MCG/ACT inhaler Inhale 2 puffs into the lungs every 6 (six) hours as needed for wheezing or shortness of breath. 18 g 6   amLODipine (NORVASC) 10 MG tablet Take 1 tablet (10 mg total) by mouth daily. 30 tablet 2   Budeson-Glycopyrrol-Formoterol (BREZTRI AEROSPHERE) 160-9-4.8 MCG/ACT AERO Inhale 2 puffs into the lungs in the morning and at bedtime. 10.7 g 11   Budeson-Glycopyrrol-Formoterol (BREZTRI AEROSPHERE) 160-9-4.8 MCG/ACT AERO Inhale 2 puffs into the lungs in the morning and at bedtime. 2 each 0   ipratropium-albuterol (DUONEB) 0.5-2.5 (3) MG/3ML SOLN Take 3 mLs by nebulization every 6 (six) hours as needed. 360 mL 0   No facility-administered medications prior to visit.    ROS    Objective:  There were no vitals filed for this visit.   Physical Exam    CBC    Component Value Date/Time   WBC 7.3 01/25/2023 1112   RBC 6.31 (H) 01/25/2023 1112   HGB 13.4 01/25/2023 1112   HCT 42.6 01/25/2023 1112   PLT 352.0 01/25/2023 1112   MCV 67.6 Repeated and verified X2. (L) 01/25/2023 1112   MCH 21.5 (  L) 12/26/2022 0017   MCHC 31.5 01/25/2023 1112   RDW 19.4 (H) 01/25/2023 1112   LYMPHSABS 1.9 01/25/2023 1112   MONOABS 0.8 01/25/2023 1112   EOSABS 0.1 01/25/2023 1112   BASOSABS 0.1 01/25/2023 1112     Chest imaging:  PFT:     No data to display          Labs:  Path:  Echo:  Heart Catheterization:       Assessment & Plan:   No diagnosis found.  Discussion: ***    Current Outpatient Medications:    acetaminophen (TYLENOL) 500 MG tablet, Take 500-1,000 mg by mouth as needed for moderate pain., Disp: , Rfl:    albuterol (VENTOLIN HFA) 108 (90 Base) MCG/ACT inhaler, Inhale 2 puffs into the lungs every 6 (six) hours as needed for wheezing or shortness of breath., Disp: 18 g, Rfl: 6   amLODipine (NORVASC) 10 MG tablet, Take 1 tablet (10 mg total) by mouth daily.,  Disp: 30 tablet, Rfl: 2   Budeson-Glycopyrrol-Formoterol (BREZTRI AEROSPHERE) 160-9-4.8 MCG/ACT AERO, Inhale 2 puffs into the lungs in the morning and at bedtime., Disp: 10.7 g, Rfl: 11   Budeson-Glycopyrrol-Formoterol (BREZTRI AEROSPHERE) 160-9-4.8 MCG/ACT AERO, Inhale 2 puffs into the lungs in the morning and at bedtime., Disp: 2 each, Rfl: 0   ipratropium-albuterol (DUONEB) 0.5-2.5 (3) MG/3ML SOLN, Take 3 mLs by nebulization every 6 (six) hours as needed., Disp: 360 mL, Rfl: 0

## 2023-05-29 ENCOUNTER — Other Ambulatory Visit (HOSPITAL_COMMUNITY): Payer: Self-pay

## 2023-06-19 ENCOUNTER — Other Ambulatory Visit: Payer: Self-pay

## 2023-06-19 ENCOUNTER — Other Ambulatory Visit (HOSPITAL_COMMUNITY): Payer: Self-pay

## 2023-06-20 ENCOUNTER — Other Ambulatory Visit (HOSPITAL_COMMUNITY): Payer: Self-pay

## 2023-07-05 ENCOUNTER — Encounter: Payer: Self-pay | Admitting: Pulmonary Disease

## 2023-07-06 ENCOUNTER — Telehealth: Payer: Self-pay | Admitting: Internal Medicine

## 2023-07-06 NOTE — Telephone Encounter (Signed)
Pharm [facility] [address]  He is out of Albuterol. Do we have any samples of rescue inhalers he can until his ins kicks in on 10/17? Please call to advise.   [phone]

## 2023-07-09 MED ORDER — ALBUTEROL SULFATE HFA 108 (90 BASE) MCG/ACT IN AERS: 2.0000 | INHALATION_SPRAY | Freq: Four times a day (QID) | RESPIRATORY_TRACT | 6 refills | Status: DC | PRN

## 2023-07-09 NOTE — Telephone Encounter (Signed)
Atc pt no answer, left voicemail. We do not carry samples of albuterol or any rescue inhalers. Refill has been sent in to confirmed pharmacy

## 2023-07-10 ENCOUNTER — Ambulatory Visit: Payer: Medicaid Other | Admitting: Pulmonary Disease

## 2023-07-11 ENCOUNTER — Encounter: Payer: Self-pay | Admitting: Pulmonary Disease

## 2023-07-24 ENCOUNTER — Other Ambulatory Visit (HOSPITAL_COMMUNITY): Payer: Self-pay

## 2023-07-26 ENCOUNTER — Other Ambulatory Visit (HOSPITAL_COMMUNITY): Payer: Self-pay

## 2023-07-26 ENCOUNTER — Telehealth: Payer: Self-pay | Admitting: Pulmonary Disease

## 2023-07-26 ENCOUNTER — Other Ambulatory Visit: Payer: Self-pay | Admitting: Student

## 2023-07-26 NOTE — Telephone Encounter (Signed)
PT can not get an appt w/us so he wants a referral to another Pulmonologist:  [facility] Fax [phone] Attn: Toniann Fail  Call # is [phone]

## 2023-07-26 NOTE — Telephone Encounter (Signed)
Lm x1 for patient.  

## 2023-07-27 ENCOUNTER — Other Ambulatory Visit (HOSPITAL_COMMUNITY): Payer: Self-pay

## 2023-07-27 NOTE — Telephone Encounter (Signed)
Please call and offer him an appointment with me. Ok to [employer] on my clinic expect on 10/28

## 2023-07-30 ENCOUNTER — Other Ambulatory Visit (HOSPITAL_COMMUNITY): Payer: Self-pay

## 2023-07-30 MED ORDER — ALBUTEROL SULFATE HFA 108 (90 BASE) MCG/ACT IN AERS
2.0000 | INHALATION_SPRAY | Freq: Four times a day (QID) | RESPIRATORY_TRACT | 0 refills | Status: DC | PRN
  Filled 2023-07-30: qty 18, 25d supply, fill #0

## 2023-07-31 NOTE — Telephone Encounter (Signed)
Left voicemail and sent mychart message for patient to call back to schedule appointment with Dr. Isaiah Serge.

## 2023-08-02 ENCOUNTER — Encounter: Payer: Self-pay | Admitting: Pulmonary Disease

## 2023-08-02 NOTE — Telephone Encounter (Signed)
Left voicemail for patient to call back to schedule visit with Dr. Isaiah Serge and mailed letter. Closing encounter as we have attempted 3 times to reach patient

## 2023-08-08 ENCOUNTER — Other Ambulatory Visit (HOSPITAL_COMMUNITY): Payer: Self-pay

## 2023-08-27 ENCOUNTER — Other Ambulatory Visit: Payer: Self-pay | Admitting: Internal Medicine

## 2023-08-27 ENCOUNTER — Other Ambulatory Visit (HOSPITAL_COMMUNITY): Payer: Self-pay

## 2023-08-28 ENCOUNTER — Other Ambulatory Visit (HOSPITAL_COMMUNITY): Payer: Self-pay

## 2023-08-28 MED ORDER — ALBUTEROL SULFATE HFA 108 (90 BASE) MCG/ACT IN AERS
2.0000 | INHALATION_SPRAY | Freq: Four times a day (QID) | RESPIRATORY_TRACT | 0 refills | Status: AC | PRN
  Filled 2023-08-28 – 2023-11-13 (×2): qty 18, 25d supply, fill #0

## 2023-09-19 ENCOUNTER — Other Ambulatory Visit (HOSPITAL_COMMUNITY): Payer: Self-pay

## 2023-10-09 ENCOUNTER — Other Ambulatory Visit (HOSPITAL_COMMUNITY): Payer: Self-pay

## 2023-10-11 ENCOUNTER — Other Ambulatory Visit (HOSPITAL_COMMUNITY): Payer: Self-pay

## 2023-11-13 ENCOUNTER — Other Ambulatory Visit (HOSPITAL_COMMUNITY): Payer: Self-pay

## 2023-12-05 ENCOUNTER — Other Ambulatory Visit: Payer: Self-pay | Admitting: Internal Medicine

## 2023-12-05 ENCOUNTER — Other Ambulatory Visit (HOSPITAL_COMMUNITY): Payer: Self-pay

## 2023-12-06 ENCOUNTER — Other Ambulatory Visit: Payer: Self-pay

## 2023-12-06 ENCOUNTER — Other Ambulatory Visit (HOSPITAL_COMMUNITY): Payer: Self-pay

## 2023-12-06 ENCOUNTER — Encounter (HOSPITAL_COMMUNITY): Payer: Self-pay

## 2023-12-28 ENCOUNTER — Other Ambulatory Visit (HOSPITAL_COMMUNITY): Payer: Self-pay

## 2024-01-14 ENCOUNTER — Telehealth: Payer: Self-pay | Admitting: Internal Medicine

## 2024-01-14 MED ORDER — BREZTRI AEROSPHERE 160-9-4.8 MCG/ACT IN AERO: 2.0000 | INHALATION_SPRAY | Freq: Two times a day (BID) | RESPIRATORY_TRACT | 0 refills | Status: DC

## 2024-01-14 NOTE — Telephone Encounter (Signed)
 Copied from CRM [id number]. Topic: Clinical - Medication Refill >> Jan 14, 2024 10:11 AM [name] wrote: Most Recent Primary Care Visit:   Medication: budeson-glycopyrrolate-formoterol (BREZTRI AEROSPHERE) 160-9-4.8 MCG/ACT AERO   Has the patient contacted their pharmacy? Yes; patient stated he was told to contact his provider. Patient attempted to schedule an appointment, however there were no appointments in April. Patient rejected a virtual visit. (Agent: If no, request that the patient contact the pharmacy for the refill. If patient does not wish to contact the pharmacy document the reason why and proceed with request.) (Agent: If yes, when and what did the pharmacy advise?)  Is this the correct pharmacy for this prescription? Yes If no, delete pharmacy and type the correct one.  This is the patient's preferred pharmacy:  [facility] [address] [place] [address] Phone: [phone] Fax: [phone]   Has the prescription been filled recently? Yes  Is the patient out of the medication? Yes  Has the patient been seen for an appointment in the last year OR does the patient have an upcoming appointment? No  Can we respond through MyChart? No  Agent: Please be advised that Rx refills may take up to 3 business days. We ask that you follow-up with your pharmacy.

## 2024-01-15 NOTE — Telephone Encounter (Signed)
 Per chart patient has No Show'd for 3 OV's in recent past. Patient has been contacted multiple times via phone, My Chart, and letter regarding scheduling an OV. LOV was 01/25/23 and patient was to follow up in 3 months.   We can refill medication x1 pending scheduling OV. Patient MUST keep OV to continue care in this office. If he would like to receive referral to an outside pulmonary office he will need to contact his PCP for assistance.   Refill sent to pharmacy 01/14/24.   Please contact patient to schedule.

## 2024-01-15 NOTE — Telephone Encounter (Signed)
 Copied from CRM [id number]. Topic: Appointments - Scheduling Inquiry for Clinic >> Jan 14, 2024 10:08 AM [name] wrote: Reason for CRM: Patient attempted to schedule an appt so he could get a refill for his medication for budeson-glycopyrrolate-formoterol (BREZTRI AEROSPHERE) 160-9-4.8 MCG/ACT AERO, however he did not want to visit another practice and there were no appointments available in April for the patient at [facility]. Patient stated he did not have reliable transportation and rejected a video visit. Patient stated he wanted a refill for medication budeson-glycopyrrolate-formoterol (BREZTRI AEROSPHERE) 160-9-4.8 MCG/ACT AERO so I am also attempting to place a refill for the medication. Patient mentioned getting ar referral to another pulmonary provider outside of [facility] since we did not have availability in April. Please call the patient back at [phone] and you can leave a vm if patient does not answer.

## 2024-01-16 NOTE — Telephone Encounter (Signed)
 I can see him in my next open slot, which may not be until June unless I have some openings in May. He's no showed for the last 2 appointments to set up care with Dr. Diania Fortes and Dr. Waylan Haggard.

## 2024-01-17 NOTE — Telephone Encounter (Signed)
 ATC patient, no one answered so I left a voicemail.

## 2024-01-22 NOTE — Telephone Encounter (Signed)
 Copied from CRM [id number]. Topic: Clinical - Prescription Issue >> Jan 21, 2024  1:44 PM Margarette Shawl wrote: Reason for CRM:   Patient was calling clinic in regards to scheduling appt with provider, to receive medication refills. He is now scheduled for 05/12 at 8:30 AM with Dr. Waymond Hailey. [facility]  ATC x1 LVM for patient to call our office back .

## 2024-01-22 NOTE — Telephone Encounter (Signed)
 Patient is scheduled with Dr.Wert.

## 2024-01-24 ENCOUNTER — Other Ambulatory Visit: Payer: Self-pay | Admitting: Internal Medicine

## 2024-01-24 MED ORDER — BREZTRI AEROSPHERE 160-9-4.8 MCG/ACT IN AERO: 2.0000 | INHALATION_SPRAY | Freq: Two times a day (BID) | RESPIRATORY_TRACT | 1 refills | Status: DC

## 2024-02-10 NOTE — Progress Notes (Unsigned)
 Subjective:     Patient ID: Colton Ford, male   DOB: December 29, 1960    MRN: [id number]    Brief patient profile:  4  yobm active smoker  Presented 04/02/11 with severe resp distress > [place] >  ICU on ventilator with dx asthma exac and proved to have GOLD 2 with reversibility  by pfts 05/02/2012 with tendency to flare in symptoms when runs out of meds   History of Present Illness  04/10/2011 Initial pulmonary office eval usually followed by Alycia Babcock with h/o childhood asthma outgrew it around the 3rd grade with one episode of ER trip 2008  And since then required intermittent albuterol  sometimes not for several months at a time but since 03/20/2011 needed it daily.  Addmit 7/1-12/2010 for status asthmaticus requiring vent overnight and extubated and discharged on advair  on 7/3. Works at [employer] where dusty and hot but last worked there 6/29 and has not returned. Still sob variably present sitting still and some worse walking across the parking lot. No purulent sputum.   rec No change rx, reviewed difference between saba and maint rx     12/01/2019  f/u ov/Henryk Ursin re: GOLD 2/ still smoking/ no saba / seeking [place] & me help on symbicort   Chief Complaint  Patient presents with   Follow-up    Breathing is overall doing well "as long as I have the symbciort"- rarely uses albuterol .   Dyspnea:  Not limited by breathing from desired activities   Cough: no Sleeping: no resp cc's SABA use: rarely as long as has symbicort   02: none Rec Plan A = Automatic = Always=    symbicort  160 Take 2 puffs first thing in am and then another 2 puffs about 12 hours later.  Plan B = Backup (to supplement plan A, not to replace it) Only use your albuterol  inhaler as a rescue medication   02/11/2024  re establish  ov/Emma Birchler re: GOLD 2 copd maint on Breztri    Chief Complaint  Patient presents with   Follow-up    Med refill.  Dyspnea:  Not limited by breathing from desired activities  / not very physically active   Cough: some in am > clear  Sleeping: flat bed 2 pillow s  resp cc  SABA use: 0 - 2 but most days not needing / has neb not using at all  02: none   Lung cancer screening :  referred today     No obvious day to day or daytime variability or assoc excess/ purulent sputum or mucus plugs or hemoptysis or cp or chest tightness, subjective wheeze or overt sinus or hb symptoms.    Also denies any obvious fluctuation of symptoms with weather or environmental changes or other aggravating or alleviating factors except as outlined above   No unusual exposure hx or h/o childhood pna/ asthma or knowledge of premature birth.  Current Allergies, Complete Past Medical History, Past Surgical History, Family History, and Social History were reviewed in [employer] record.  ROS  The following are not active complaints unless bolded Hoarseness, sore throat, dysphagia, dental problems, itching, sneezing,  nasal congestion or discharge of excess mucus or purulent secretions, ear ache,   fever, chills, sweats, unintended wt loss or wt gain, classically pleuritic or exertional cp,  orthopnea pnd or arm/hand swelling  or leg swelling, presyncope, palpitations, abdominal pain, anorexia, nausea, vomiting, diarrhea  or change in bowel habits or change in bladder habits, change in stools or change  in urine, dysuria, hematuria,  rash, arthralgias, visual complaints, headache, numbness, weakness or ataxia or problems with walking or coordination,  change in mood or  memory.        Current Meds  Medication Sig   acetaminophen  (TYLENOL ) 500 MG tablet Take 500-1,000 mg by mouth as needed for moderate pain.   albuterol  (VENTOLIN  HFA) 108 (90 Base) MCG/ACT inhaler Inhale 2 puffs into the lungs every 6 (six) hours as needed for wheezing or shortness of breath. **need appt**   amLODipine  (NORVASC ) 10 MG tablet Take 1 tablet (10 mg total) by mouth daily.   budeson-glycopyrrolate -formoterol  (BREZTRI   AEROSPHERE) 160-9-4.8 MCG/ACT AERO inhaler Inhale 2 puffs into the lungs in the morning and at bedtime.   ipratropium-albuterol  (DUONEB) 0.5-2.5 (3) MG/3ML SOLN Take 3 mLs by nebulization every 6 (six) hours as needed.                            Objective:   Physical Exam   Wts  02/11/2024   141   12/01/2019     168  10/29/2018  166  Wt 172 04/10/2011 > 03/21/2012 184 > 07/24/2012  183 >  04/16/2013 185 > 08/18/2013  182 > 11/17/2014  160 > 04/10/2017  170>   Vital signs reviewed  02/11/2024  - Note at rest 02 sats  97% on RA   General appearance:    pleasant thin amb bm nad   Wt Readings from Last 3 Encounters:  02/11/24 141 lb 6.4 oz (64.1 kg)  01/25/23 154 lb (69.9 kg)  12/26/22 129 lb 6.6 oz (58.7 kg)       HEENT : Oropharynx  clear/ poor dentition   Nasal turbinates nl    NECK :  without  apparent JVD/ palpable Nodes/TM    LUNGS: no acc muscle use,  Min barrel  contour chest wall with bilateral  slightly decreased bs s audible wheeze and  without cough on insp or exp maneuvers and min  Hyperresonant  to  percussion bilaterally    CV:  RRR  no s3 or murmur or increase in P2, and no edema   ABD:  soft and nontender with pos end  insp Hoover's  in the supine position.  No bruits or organomegaly appreciated   MS:  Nl gait/ ext warm without deformities Or obvious joint restrictions  calf tenderness, cyanosis or clubbing     SKIN: warm and dry without lesions    NEURO:  alert, approp, nl sensorium with  no motor or cerebellar deficits apparent.                 Assessment:

## 2024-02-10 NOTE — Patient Instructions (Incomplete)
                Assessment:

## 2024-02-11 ENCOUNTER — Ambulatory Visit (INDEPENDENT_AMBULATORY_CARE_PROVIDER_SITE_OTHER): Admitting: Internal Medicine

## 2024-02-11 ENCOUNTER — Encounter: Payer: Self-pay | Admitting: Internal Medicine

## 2024-02-11 VITALS — BP 160/90 | HR 73 | Temp 98.1°F | Ht 72.0 in | Wt 141.4 lb

## 2024-02-11 DIAGNOSIS — F17210 Nicotine dependence, cigarettes, uncomplicated: Secondary | ICD-10-CM | POA: Diagnosis not present

## 2024-02-11 DIAGNOSIS — K089 Disorder of teeth and supporting structures, unspecified: Secondary | ICD-10-CM | POA: Diagnosis not present

## 2024-02-11 DIAGNOSIS — I10 Essential (primary) hypertension: Secondary | ICD-10-CM

## 2024-02-11 DIAGNOSIS — J449 Chronic obstructive pulmonary disease, unspecified: Secondary | ICD-10-CM

## 2024-02-11 MED ORDER — BREZTRI AEROSPHERE 160-9-4.8 MCG/ACT IN AERO: 2.0000 | INHALATION_SPRAY | Freq: Two times a day (BID) | RESPIRATORY_TRACT | 11 refills | Status: AC

## 2024-02-11 NOTE — Assessment & Plan Note (Signed)
 Active smoker unable to afford his meds     - PFT's 04/25/2011 FEV1  2.66 (72%) ratio 56 and 13% better p B2 with DLC0 87%     - PFT's 05/02/2012 FEV1    2.05 (56%) p B2 (34% better) and ratio still 51 and  DLCO 67 corrects to 84% for alv vol  - 02/11/2024  After extensive coaching inhaler device,  effectiveness =    80% hfa   Pt is Group B in terms of symptom/risk and laba/lama therefore appropriate rx at this point >>>  continue breztri  and approp saba hfa/neb  using ABC action plan/ reviewed see avs for instructions unique to this ov   F/u yearly, sooner prn

## 2024-02-11 NOTE — Assessment & Plan Note (Addendum)
 Poor control but admits stressed out and not taking his meds consistently including day of ov  - rec f/u PCP and take meds as prescribed on time prior to all ov's

## 2024-02-11 NOTE — Assessment & Plan Note (Addendum)
 Strongly advised to seek dental care 02/11/2024 >>>   Each maintenance medication was reviewed in detail including emphasizing most importantly the difference between maintenance and prns and under what circumstances the prns are to be triggered using an action plan format where appropriate.  Total time for H and P, chart review, counseling, reviewing hfa device(s) and generating customized AVS unique to this office visit / same day charting = 30 min

## 2024-03-14 ENCOUNTER — Other Ambulatory Visit: Payer: Self-pay | Admitting: Internal Medicine

## 2024-03-14 NOTE — Telephone Encounter (Signed)
 Copied from CRM [id number]. Topic: Clinical - Medication Refill >> Mar 14, 2024 11:24 AM Corean Deutscher wrote: Medication: budeson-glycopyrrolate -formoterol  (BREZTRI  AEROSPHERE) 160-9-4.8 MCG/ACT AERO inhaler  Has the patient contacted their pharmacy? Yes (Agent: If no, request that the patient contact the pharmacy for the refill. If patient does not wish to contact the pharmacy document the reason why and proceed with request.) (Agent: If yes, when and what did the pharmacy advise?)  This is the patient's preferred pharmacy:  [id number] [place], [place] - [address] [place] [address] Phone: [phone] Fax: [phone]   Is this the correct pharmacy for this prescription? Yes If no, delete pharmacy and type the correct one.   Has the prescription been filled recently? Yes  Is the patient out of the medication? Yes  Has the patient been seen for an appointment in the last year OR does the patient have an upcoming appointment? Yes  Can we respond through MyChart? No  Agent: Please be advised that Rx refills may take up to 3 business days. We ask that you follow-up with your pharmacy.

## 2024-03-14 NOTE — Telephone Encounter (Signed)
 FYI Only. No action needed at this time. Patient called Pharmacy and they are filling this prescription.  He didn't know he had refills on file at the Pharmacy.  He denies any new complaints/symptoms.

## 2024-07-18 ENCOUNTER — Telehealth: Payer: Self-pay | Admitting: Acute Care

## 2024-07-18 DIAGNOSIS — F17210 Nicotine dependence, cigarettes, uncomplicated: Secondary | ICD-10-CM

## 2024-07-18 DIAGNOSIS — Z122 Encounter for screening for malignant neoplasm of respiratory organs: Secondary | ICD-10-CM

## 2024-07-18 DIAGNOSIS — Z87891 Personal history of nicotine dependence: Secondary | ICD-10-CM

## 2024-07-18 NOTE — Telephone Encounter (Signed)
 Lung Cancer Screening Narrative/Criteria Questionnaire (Cigarette Smokers Only- No Cigars/Pipes/vapes)   JOREN REHM   SDMV:07/28/2024 9:00 Natalie        1961/07/27   LDCT: 08/06/2024 9:00 GI    63 y.o.   Phone: [phone]  Lung Screening Narrative (confirm age 52-77 yrs Medicare / 50-80 yrs Private pay insurance)   Insurance information:mcd   Referring Provider:Dr. Darlean   This screening involves an initial phone call with a team member from our program. It is called a shared decision making visit. The initial meeting is required by  insurance and Medicare to make sure you understand the program. This appointment takes about 15-20 minutes to complete. You will complete the screening scan at your scheduled date/time.  This scan takes about 5-10 minutes to complete. You can eat and drink normally before and after the scan.  Criteria questions for Lung Cancer Screening:   Are you a current or former smoker? Current Age began smoking: 63yo   If you are a former smoker, what year did you quit smoking? N/A(within 15 yrs)   To calculate your smoking history, I need an accurate estimate of how many packs of cigarettes you smoked per day and for how many years. (Not just the number of PPD you are now smoking)   Years smoking 50 x Packs per day 1 = Pack years 50   (at least 20 pack yrs)   (Make sure they understand that we need to know how much they have smoked in the past, not just the number of PPD they are smoking now)  Do you have a personal history of cancer?  No    Do you have a family history of cancer? No  Are you coughing up blood?  No  Have you had unexplained weight loss of 15 lbs or more in the last 6 months? No  It looks like you meet all criteria.  When would be a good time for us  to schedule you for this screening?   Additional information: N/A

## 2024-07-28 ENCOUNTER — Ambulatory Visit: Admitting: *Deleted

## 2024-07-28 ENCOUNTER — Encounter: Payer: Self-pay | Admitting: *Deleted

## 2024-07-28 DIAGNOSIS — F17210 Nicotine dependence, cigarettes, uncomplicated: Secondary | ICD-10-CM | POA: Diagnosis not present

## 2024-07-28 NOTE — Progress Notes (Signed)
 Virtual Visit via Telephone Note  I connected with Ozell DELENA Gainer on 07/28/24 at  9:00 AM EDT by telephone and verified that I am speaking with the correct person using two identifiers.  Location: Patient: at home Provider: [address] [place], [place], [address]    I discussed the limitations, risks, security and privacy concerns of performing an evaluation and management service by telephone and the availability of in person appointments. I also discussed with the patient that there may be a patient responsible charge related to this service. The patient expressed understanding and agreed to proceed.   Shared Decision Making Visit Lung Cancer Screening Program [phone])   Eligibility: Age 63 y.o. Pack Years Smoking History Calculation 50 (# packs/per year x # years smoked) Recent History of coughing up blood  no Unexplained weight loss? no ( >Than 15 pounds within the last 6 months ) Prior History Lung / other cancer no (Diagnosis within the last 5 years already requiring surveillance chest CT Scans). Smoking Status Current Smoker Former Smokers: Years since quit: n/a  Quit Date: n/a  Visit Components: Discussion included one or more decision making aids. yes Discussion included risk/benefits of screening. yes Discussion included potential follow up diagnostic testing for abnormal scans. yes Discussion included meaning and risk of over diagnosis. yes Discussion included meaning and risk of False Positives. yes Discussion included meaning of total radiation exposure. yes  Counseling Included: Importance of adherence to annual lung cancer LDCT screening. yes Impact of comorbidities on ability to participate in the program. yes Ability and willingness to under diagnostic treatment. yes  Smoking Cessation Counseling: Current Smokers:  Discussed importance of smoking cessation. yes Information about tobacco cessation classes and interventions provided to patient.  yes Patient provided with ticket for LDCT Scan. no Symptomatic Patient. yes   Diagnosis Code: Tobacco Use Z72.0 Asymptomatic Patient yes  Counseled patient 4 minutes regarding tobacco use.   Former Smokers:  Discussed the importance of maintaining cigarette abstinence. yes Diagnosis Code: Personal History of Nicotine  Dependence. S12.108 Information about tobacco cessation classes and interventions provided to patient. Yes Patient provided with ticket for LDCT Scan. no Written Order for Lung Cancer Screening with LDCT placed in Epic. Yes (CT Chest Lung Cancer Screening Low Dose W/O CM) [id number] Z12.2-Screening of respiratory organs Z87.891-Personal history of nicotine  dependence   Laneta Speaks, RN

## 2024-07-28 NOTE — Patient Instructions (Signed)

## 2024-08-06 ENCOUNTER — Inpatient Hospital Stay: Admission: RE | Admit: 2024-08-06 | Source: Ambulatory Visit
